# Patient Record
Sex: Male | Born: 1935 | Race: White | Hispanic: No | Marital: Married | Smoking: Former smoker
Health system: Southern US, Community
[De-identification: ages and names within clinical notes are randomized; demographics above are authoritative.]

## PROBLEM LIST (undated history)

## (undated) DIAGNOSIS — I2510 Atherosclerotic heart disease of native coronary artery without angina pectoris: Secondary | ICD-10-CM

## (undated) DIAGNOSIS — I10 Essential (primary) hypertension: Secondary | ICD-10-CM

## (undated) DIAGNOSIS — I4891 Unspecified atrial fibrillation: Secondary | ICD-10-CM

## (undated) DIAGNOSIS — C3412 Malignant neoplasm of upper lobe, left bronchus or lung: Secondary | ICD-10-CM

## (undated) HISTORY — DX: Malignant neoplasm of upper lobe, left bronchus or lung: C34.12

## (undated) HISTORY — PX: TONSILLECTOMY: SUR1361

## (undated) HISTORY — DX: Unspecified atrial fibrillation: I48.91

---

## 1998-11-29 ENCOUNTER — Inpatient Hospital Stay (HOSPITAL_COMMUNITY): Admission: EM | Admit: 1998-11-29 | Discharge: 1998-12-04 | Payer: Self-pay | Admitting: *Deleted

## 2004-10-28 ENCOUNTER — Ambulatory Visit (HOSPITAL_COMMUNITY): Admission: RE | Admit: 2004-10-28 | Discharge: 2004-10-28 | Payer: Self-pay | Admitting: Family Medicine

## 2004-11-11 ENCOUNTER — Ambulatory Visit (HOSPITAL_COMMUNITY): Admission: RE | Admit: 2004-11-11 | Discharge: 2004-11-11 | Payer: Self-pay | Admitting: Internal Medicine

## 2004-11-11 ENCOUNTER — Ambulatory Visit: Payer: Self-pay | Admitting: Internal Medicine

## 2005-09-16 HISTORY — PX: NM MYOCAR PERF WALL MOTION: HXRAD629

## 2005-10-22 ENCOUNTER — Ambulatory Visit (HOSPITAL_COMMUNITY): Admission: RE | Admit: 2005-10-22 | Discharge: 2005-10-22 | Payer: Self-pay | Admitting: *Deleted

## 2005-10-28 ENCOUNTER — Ambulatory Visit (HOSPITAL_COMMUNITY): Admission: RE | Admit: 2005-10-28 | Discharge: 2005-10-28 | Payer: Self-pay | Admitting: *Deleted

## 2005-10-28 HISTORY — PX: CARDIAC CATHETERIZATION: SHX172

## 2005-12-10 ENCOUNTER — Inpatient Hospital Stay (HOSPITAL_COMMUNITY): Admission: RE | Admit: 2005-12-10 | Discharge: 2005-12-18 | Payer: Self-pay | Admitting: Cardiothoracic Surgery

## 2005-12-10 HISTORY — PX: CORONARY ARTERY BYPASS GRAFT: SHX141

## 2006-01-16 ENCOUNTER — Encounter: Admission: RE | Admit: 2006-01-16 | Discharge: 2006-01-16 | Payer: Self-pay | Admitting: Cardiothoracic Surgery

## 2006-01-19 ENCOUNTER — Encounter (HOSPITAL_COMMUNITY): Admission: RE | Admit: 2006-01-19 | Discharge: 2006-02-18 | Payer: Self-pay | Admitting: *Deleted

## 2006-02-19 ENCOUNTER — Encounter (HOSPITAL_COMMUNITY): Admission: RE | Admit: 2006-02-19 | Discharge: 2006-03-21 | Payer: Self-pay | Admitting: *Deleted

## 2006-03-23 ENCOUNTER — Encounter (HOSPITAL_COMMUNITY): Admission: RE | Admit: 2006-03-23 | Discharge: 2006-04-22 | Payer: Self-pay | Admitting: *Deleted

## 2006-04-24 ENCOUNTER — Encounter (HOSPITAL_COMMUNITY): Admission: RE | Admit: 2006-04-24 | Discharge: 2006-05-24 | Payer: Self-pay | Admitting: *Deleted

## 2007-02-09 ENCOUNTER — Ambulatory Visit (HOSPITAL_COMMUNITY): Admission: RE | Admit: 2007-02-09 | Discharge: 2007-02-09 | Payer: Self-pay | Admitting: *Deleted

## 2007-06-28 ENCOUNTER — Ambulatory Visit (HOSPITAL_COMMUNITY): Admission: RE | Admit: 2007-06-28 | Discharge: 2007-06-28 | Payer: Self-pay

## 2009-02-02 ENCOUNTER — Ambulatory Visit (HOSPITAL_COMMUNITY): Admission: RE | Admit: 2009-02-02 | Discharge: 2009-02-02 | Payer: Self-pay | Admitting: Nephrology

## 2010-08-19 ENCOUNTER — Ambulatory Visit (HOSPITAL_COMMUNITY): Admission: RE | Admit: 2010-08-19 | Discharge: 2010-08-19 | Payer: Self-pay | Admitting: Internal Medicine

## 2011-03-07 NOTE — Op Note (Signed)
[name], [name]              ACCOUNT NO.:  [id number]   MEDICAL RECORD NO.:  [id number]          PATIENT TYPE:  INP   LOCATION:  2310                         FACILITY:  [facility]   PHYSICIAN:  Kerin Perna, M.D.  DATE OF BIRTH:  [birth year]   DATE OF PROCEDURE:  12/10/2005  DATE OF DISCHARGE:                                 OPERATIVE REPORT   OPERATION:  Coronary artery bypass grafting x4 (left internal mammary artery  to LAD, saphenous vein graft to diagonal, saphenous vein graft to ramus  intermediate, saphenous vein graft to distal right coronary artery).   PRE AND POSTOPERATIVE DIAGNOSIS:  Class IV progressive angina with severe  three-vessel coronary artery disease.   SURGEON:  Kerin Perna MD   ASSISTANT:  Gershon Crane PA-C   ANESTHESIA:  General by Dr. Judie Petit.   INDICATIONS:  The patient is a 76 year old white male smoker who presented  with history of exertional chest pain. He was evaluated by his cardiologist,  Dr. Domingo Sep and a stress test showed evidence of ischemia. A cardiac  catheterization by Dr. Jenne Campus demonstrated a high-grade stenosis of the  diagonal (90%) and moderate stenosis of the LAD (60%). His right coronary  had severe mid vessel stenosis of 80-90% and the ramus intermediate had a 60  to 80% stenosis. The circumflex was a small nondominant vessel and had  moderate disease. His LV EF was 55%. His left ventricular end-diastolic  pressure was elevated at 28 mmHg. He is felt to be candidate for surgical  revascularization.   Prior to surgery I examined the patient in the office and reviewed results  of the cardiac catheterization with the patient and family. I discussed  indications and expected benefits of coronary artery bypass surgery for  treatment of his coronary disease. I reviewed the alternatives to surgical  therapy as well. I discussed with the patient major aspects of the planned  procedure including the choice of conduit to  include internal mammary artery  and endoscopically harvested saphenous vein, the location of the surgical  incisions, use of general anesthesia and cardiopulmonary bypass, and the  expected postoperative recovery. I discussed with the patient risks to him  of coronary artery bypass surgery including risks of MI, CVA, bleeding,  blood transfusion requirement, infection, and death. After reviewing all  these issues with the patient, he demonstrated his understanding and agreed  to proceed with operation as planned under what I felt was an informed  consent.   OPERATIVE FINDINGS:  The patient's saphenous vein was harvested  endoscopically from the thigh and from the entire right lower leg below the  knee using an open technique. The mammary artery was a good vessel with  excellent flow. The circumflex vessels were small less than 1 mm and  nongraftable. The posterior descending vessel was less than 1 mm and not  graftable and the bypass graft to right coronary was placed beyond the tight  mid vessel obstruction. The patient did not receive aprotinin, he did not  receive blood transfusion products.   PROCEDURE:  The patient was brought to operative room,  placed supine on the  operative table. General anesthesia was induced under invasive hemodynamic  monitoring. The chest, abdomen and legs were prepped with Betadine and  draped as a sterile field. A sternal incision was made as the saphenous vein  was harvested endoscopically from the right leg. The left internal mammary  artery was harvested as a pedicle graft from its origin at the subclavian  vessels.  It was a good vessel with excellent flow. Heparin was administered  and the ACT was documented as being therapeutic. The sternal retractor was  placed and the pericardium was opened and suspended. Pursestrings were  placed in the ascending aorta and right atrium and the patient was  cannulated placed on bypass. The coronaries were  identified for grafting in  the mammary artery and vein grafts were prepared for the distal anastomoses.  The cardioplegia catheters were placed in the ascending aorta and into the  coronary sinus for both antegrade and retrograde cold blood cardioplegia.  The patient was cooled to 32 degrees. The crossclamp was then applied and  800 mL of the of cold blood cardioplegia was delivered in split doses  between the antegrade aortic and retrograde coronary sinus catheters. There  was good cardioplegic arrest and septal temperature dropped less than 12  degrees.   The distal coronary anastomoses were performed. First distal anastomosis was  to the distal right coronary. This a 1.75-mm vessel with proximal 80 to 90%  stenosis. Reverse saphenous vein sewn end-to-side with running 7-0 Prolene  and there was good flow through graft. The second distal anastomosis was to  the first diagonal branch of the LAD. This was a 1.5-mm vessel with proximal  90% stenosis. Reverse saphenous vein sewn end-to-side with running 7-0  Prolene with good flow through graft. Cardioplegia was redosed. The third  distal anastomosis was to the ramus intermediate. This is a 1.5-mm vessel  with proximal 75% stenosis. A reverse saphenous vein was sewn end-to-side  with running 7-0 Prolene.  There was good flow through the graft. The fourth  distal anastomosis was to the distal third of the LAD. More proximally it  was deeply intramyocardial. The left IMA pedicle was brought through an  opening created in the left lateral pericardium and was brought down onto  the LAD and sewn end-to-side with running 8-0 Prolene. There is excellent  flow through the anastomosis after briefly releasing the pedicle bulldog on  the mammary pedicle. The bulldog was replaced in the pedicle secured  epicardium.   Cardioplegia was redosed using the retrograde catheter. While the crossclamp  was still in place, three proximal vein anastomoses were  performed on the ascending aorta using a 4.0-mm punch and running 6-0 Prolene. Prior to  releasing crossclamp and prior to tying down the final proximal anastomosis,  air was vented from the coronaries and the left side of the heart using a  dose of retrograde warm blood cardioplegia and the usual de-airing maneuvers  on bypass. The final proximal anastomosis was then tied down and the  crossclamp was removed.   The heart was cardioverted back to a regular rhythm. Air was aspirated from  the vein grafts using a 27 gauge needle. The bypass grafts were opened and  each had excellent flow. Hemostasis was documented at the proximal and  distal sites. The patient was rewarmed to 37 degrees. Temporary pacing wires  were applied. Lungs re-expanded and the ventilator was resumed. The patient  was then weaned from bypass without difficulty without  inotropes. Blood  pressure and cardiac output were stable. Protamine was administered without  adverse reaction. The cannulas removed. The mediastinum was irrigated with  warm antibiotic solution. The leg incision was irrigated and closed in a  standard fashion. The superior pericardium was closed over the heart. Two  mediastinal and left pleural chest tube were placed and brought out through  separate incisions. The sternum was closed with interrupted steel wire. The  pectoralis fascia was closed with a running #1 Vicryl. Subcutaneous and skin  layers were closed with running Vicryl and sterile dressings were applied.  Total bypass time was 120 minutes, crossclamp time of 80 minutes.      Kerin Perna, M.D.  Electronically Signed     PV/MEDQ  D:  12/10/2005  T:  12/11/2005  Job:  [id number]   cc:   Dani Gobble, MD  Fax: [phone]

## 2011-03-07 NOTE — Op Note (Signed)
NAMEIFEANYI, MICKELSON              ACCOUNT NO.:  [id number]   MEDICAL RECORD NO.:  [id number]          PATIENT TYPE:  AMB   LOCATION:  DAY                           FACILITY:  [facility]   PHYSICIAN:  R. Roetta Sessions, M.D. DATE OF BIRTH:  [birth year]   DATE OF PROCEDURE:  11/11/2004  DATE OF DISCHARGE:                                 OPERATIVE REPORT   PROCEDURE:  Colonoscopy screening with biopsy.   ENDOSCOPIST:  Jonathon Bellows, M.D.   INDICATIONS FOR PROCEDURE:  The patient is a 76 year old gentleman sent to  me at the courtesy of Dr. Jorene Guest for screening colonoscopy.  He is devoid  of any lower GI tract symptoms. He has never had colon imaging previously.  Family history is significant in that he had a sister at age 85 succumb to  colorectal cancer.  This approach has been discussed with the patient at  length with the potential risks, benefits and alternatives having been  reviewed and questions answered.  Please see documentation in the medical  record.   DESCRIPTION OF PROCEDURE:  02 saturation, blood pressure, pulse and  respirations were monitored throughout the entirety of the procedure.  Conscious sedation using Versed 2 mg IV and Demerol 50 mg IV in single dose.  The instrument, Olympus video chip system.   FINDINGS:  Digital rectal examination revealed no abnormalities.  The  endoscopic prep was good.   Rectum:  Normal mucosa.  Retroflexion view of the anal verge revealed no  abnormalities in the anticolonic or colonic mucosa which was surveyed from  the rectosigmoid junction to the left, transverse and right colon to the  appendical orifice and ileocecal valve and cecum.  These structures were  well seen and photographed for the record.  Slowly, the scope was slow  withdrawn.  The previous imaged mucosal surfaces were again seen.  The  patient had a 3 mm polyp at the base of the cecum which was cold  biopsied/removed.  The remainder of the colonic mucosa appeared  normal.  The  patient tolerated the procedure well and was reacted.   ENDOSCOPIC ASSESSMENT:  1.  Normal rectum.  2.  Diminutive cecal polyp, cold biopsied/removed.  3.  The remainder of the colonic mucosa appeared normal.   RECOMMENDATIONS:  1.  Follow-up on path.  2.  Further recommendations to follow.      RMR/MEDQ  D:  11/11/2004  T:  11/11/2004  Job:  [id number]   cc:   Reynolds Bowl, M.D.  [place], [place]

## 2011-03-07 NOTE — Cardiovascular Report (Signed)
[name], [name]              ACCOUNT NO.:  [id number]   MEDICAL RECORD NO.:  [id number]          PATIENT TYPE:  OIB   LOCATION:  2899                         FACILITY:  [facility]   PHYSICIAN:  Darlin Priestly, MD  DATE OF BIRTH:  [birth year]   DATE OF PROCEDURE:  10/28/2005  DATE OF DISCHARGE:  10/28/2005                              CARDIAC CATHETERIZATION   PROCEDURE:  1.  Left heart catheterization.  2.  Coronary angiography.  3.  Left ventriculogram.  4.  Abdominal aortogram.  5.  Bilateral renal angiogram.   ATTENDING:  Darlin Priestly, M.D.   COMPLICATIONS:  None.   INDICATION:  [name] is a 76 year old male, patient of Dr. Kem Boroughs, Dr. Geanie Cooley, with a history of CAD, status post inferior wall  MI in 2000. 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 He has been followed by Dr. Domingo Sep since that time and recently  underwent repeat Cardiolite scan revealing anterolateral ischemia. He is now  brought for cardiac catheterization to reassess his coronary anatomy.   DESCRIPTION OF OPERATION:  After gaining informed consent the patient was  brought to the cardiac cath lab. The left groin was shaved, prepped and  draped in the usual sterile fashion. He received ___________. He is in  modified Seldinger. A 6 French diagnostic catheter was being performed and  diagnostic angiography.   Left main is a large vessel with no significant disease. It gives rise to  the LAD and a ramus intermedius. The left main has no significant disease.   The LAD is a large vessel which gives rise to one bifurcating diagonal  branch.  LAD has mild 50 to 60% stenosis in the mid LAD as well as 40%  stenosis just beyond the take off of the first diagonal.   The first diagonal is a medium size  vessel which bifurcates distally with a  95% ostial and proximal stenosis.   The ramus intermedius is a medium to large size vessel which bifurcates in  the mid segment with a 60% mid stenosis. There is a 50% lesion in the upper  branch of the ramus.   The AV circumflex is a medium size vessel which courses in the AV groove and  gives rise to 2 obtuse marginal branches as well as a large AV ___________  is a medium to small to medium size vessel which bifurcates distally and has  a 90% ostial lesion. The second limb is a medium size vessel with no  significant disease.   The right coronary artery is a large vessel which is dominant.  It gives  rise to both a PDA and posterolateral branch. It does original posteriorly  close to the left coronary cusp and is filled nonselectively with an AL2  catheter. There appears to be diffuse 40% proximal disease. There is a  relatively focal distal 80%  lesion which appears to be calcified. There is a  more distal 60% lesion just prior to the bifurcation of the PDA and  posterolateral branch.   The PDA and posterolateral branch __________ medium size vessels which are  irregular but have no __________ stenosis.   Left ventriculogram reveals a preserved EF of 50%.   Abdominal aortogram was no __________ although the renals are not well  visualized.   Bilateral selective renal angiograms reveal no evidence of significant  artery stenosis.   Hemodynamic system:  Systemic arterial pressure 187/84, LV systemic pressure  187/13, LVP of 20.   CONCLUSION:  1.  Significant 3 vessel coronary artery disease.  2.  Normal left ventricular systolic function.  3.  No evidence for renal artery stenosis.  4.  Systemic hypertension.  5.  Elevated left ventricular pressure.      Darlin Priestly, MD  Electronically Signed     RHM/MEDQ  D:  10/28/2005  T:  10/29/2005  Job:  [id number]

## 2011-03-07 NOTE — Discharge Summary (Signed)
[name], [name]              ACCOUNT NO.:  [id number]   MEDICAL RECORD NO.:  [id number]          PATIENT TYPE:  INP   LOCATION:  2006                         FACILITY:  [facility]   PHYSICIAN:  Kerin Perna, M.D.  DATE OF BIRTH:  07/14/1935   DATE OF ADMISSION:  12/10/2005  DATE OF DISCHARGE:                                 DISCHARGE SUMMARY   DATE OF DISCHARGE:  Anticipated, 12/17/2005.   PRIMARY ADMITTING DIAGNOSIS:  Severe three-vessel coronary artery disease.   DISCHARGE DIAGNOSES:  1.  Severe three-vessel coronary artery disease.  2.  Stable angina.  3.  Hypertension.  4.  Hyperlipidemia.  5.  Ongoing tobacco abuse.  6.  Postoperative atrial fibrillation/atrial flutter.  7.  Chronic obstructive pulmonary disease.   PROCEDURE PERFORMED:  1.  Coronary artery bypass grafting x4 (left internal mammary artery to the      LAD, saphenous vein graft to the diagonal, saphenous vein graft to the      ramus intermedius, saphenous vein graft to the distal right coronary      artery).  2.  Endoscopic vein harvest, right leg.   HISTORY:  The patient is 76 year old white male.  He was recently referred  to Dr. Donata Clay for evaluation of severe three-vessel coronary artery  disease.  He has a history of coronary disease and had a myocardial  infarction in 2000.  He was lost to followup, however, he recently was seen  by Dr. Domingo Sep and was evaluated with a stress Cardiolite study.  He has  had stable angina symptoms and was found on Cardiolite to have evidence of  ischemia.  Subsequently, a cardiac catheterization was performed, by Dr.  Jenne Campus, which showed severe three-vessel coronary artery disease with high-  grade stenosis of the diagonal at 90%, moderate stenosis of the LAD at 60%,  severe mid right stenosis of 80-90%, and a 60-80% stenosis of the ramus  intermedius.  His left ventricular ejection fraction was 55%. He was felt to  be a poor candidate for percutaneous  intervention.  Dr. Donata Clay saw the  patient and evaluated his films and felt that his best course of action  would be to proceed with surgical revascularization.  He explained the  risks, benefits, and alternatives of the procedure to the patient and he  agreed to proceed with surgery.   HOSPITAL COURSE:  [name] was admitted to [facility], on  December 10, 2005, and was taken to the operating room where he underwent  CABG x4, as described in detail above.  He tolerated the procedure well and  was transferred to the SICU in stable condition.  He was able to be  extubated shortly after surgery.  He was hemodynamically stable and doing  well on postop day #1.  At that time, his chest tubes and hemodynamic  monitoring devices were removed, and he was kept in the unit for overnight  observation.  By postop day #2, he was ready for transfer to the floor.  He  developed atrial fibrillation postoperatively and was started on an  amiodarone drip.  He converted to normal sinus rhythm and was switched to  p.o. amiodarone.  However, he continued to have intermittent breakthrough  episodes of atrial fibrillation and atrial flutter.  His beta blocker dose  was titrated upward, and he was ultimately started on Coumadin on December 15, 2005.  Since that time, his rhythm has stabilized and presently he is  maintaining rates of 80-90 in sinus rhythm.  He has otherwise done well  postoperatively.  He developed a productive cough with a slight elevation in his white blood  cell count.  He was started empirically on Avelox and was treated with  aggressive pulmonary toilet measures including nebulizer treatments and  metered-dose inhalers.  His white blood cell count has trended downward and  his lungs are now clear on exam.  He has also been volume overloaded and has  been diuresed back down to his preoperative weight.  He has ambulated in the  halls without difficulty and is tolerating a  regular diet without problem.  Presently, he is afebrile and all vital signs are stable.  His labs, on December 16, 2005, show a hemoglobin of 10.9, hematocrit 31.6,  white count 8.1, platelets 229.  Sodium 139, potassium 4.1, BUN 26,  creatinine 1.1.  PT 19.6, INR 1.6.  His incisions are all healing well.  It is felt that he remains stable over the next 24 hours and his heart  rhythm remains sinus, that he will hopefully be ready for discharge home on  December 17, 2005.  He will be evaluated on morning rounds and hopefully his  INR will be therapeutic at that time.   DISCHARGE MEDICATIONS:  Will be as follows:  1.  Coumadin home dose will be determined by PT and INR drawn on the date of      discharge.  2.  Aspirin 81 mg daily.  3.  Lopressor 25 mg b.i.d.  4.  Zocor 40 mg q.h.s.  5.  Amiodarone 400 mg b.i.d. x3 days, then 200 mg b.i.d.  6.  Lasix 40 mg every day x1 week.  7.  K-Dur 20 mEq every day x1 week.  8.  Flomax 0.4 mg every day.  9.  Advair 250/50, one puff b.i.d.  10. Ultram 50 to 100 mg every four to six hours p.r.n. for pain.   DISCHARGE INSTRUCTIONS:  1.  He is asked to refrain from driving, heavy lifting or strenuous      activity.  2.  He may continue to ambulate daily and use his incentive spirometer.  3.  He may shower daily and clean his incisions with soap and water.  4.  He will continue his same preoperative diet.   DISCHARGE FOLLOWUP:  1.  He is asked to make an appointment to see Dr. Domingo Sep in two weeks.  2.  He will also need to have a PT and INR drawn at the [facility] around 48 hours from the time of discharge for      monitoring of his Coumadin.  3.  He will see Dr. Donata Clay back in the office in three weeks and our      office will contact him with the appointment and an appointment for a      chest x-ray at [facility].  He is asked to contact our office in the interim if he experiences any  problems or has  questions.      Coral Ceo, P.A.  Kerin Perna, M.D.  Electronically Signed    GC/MEDQ  D:  12/16/2005  T:  12/16/2005  Job:  [id number]   cc:   Kem Boroughs, Dr.   Corrie Mckusick, M.D.  Fax: [phone]

## 2011-06-03 ENCOUNTER — Other Ambulatory Visit (HOSPITAL_COMMUNITY): Payer: Self-pay | Admitting: Family Medicine

## 2011-06-03 ENCOUNTER — Ambulatory Visit (HOSPITAL_COMMUNITY)
Admission: RE | Admit: 2011-06-03 | Discharge: 2011-06-03 | Disposition: A | Discharge status: Home / Self Care | Payer: Medicare Other | Source: Ambulatory Visit | Attending: Family Medicine | Admitting: Family Medicine

## 2011-06-03 DIAGNOSIS — M545 Low back pain, unspecified: Secondary | ICD-10-CM | POA: Insufficient documentation

## 2011-06-03 DIAGNOSIS — M549 Dorsalgia, unspecified: Secondary | ICD-10-CM

## 2011-06-03 DIAGNOSIS — M899 Disorder of bone, unspecified: Secondary | ICD-10-CM | POA: Insufficient documentation

## 2011-06-03 DIAGNOSIS — M949 Disorder of cartilage, unspecified: Secondary | ICD-10-CM | POA: Insufficient documentation

## 2011-06-03 DIAGNOSIS — M5137 Other intervertebral disc degeneration, lumbosacral region: Secondary | ICD-10-CM | POA: Insufficient documentation

## 2011-06-03 DIAGNOSIS — M51379 Other intervertebral disc degeneration, lumbosacral region without mention of lumbar back pain or lower extremity pain: Secondary | ICD-10-CM | POA: Insufficient documentation

## 2012-03-02 ENCOUNTER — Encounter (HOSPITAL_COMMUNITY): Payer: Self-pay | Admitting: *Deleted

## 2012-03-02 ENCOUNTER — Emergency Department (HOSPITAL_COMMUNITY)
Admission: EM | Admit: 2012-03-02 | Discharge: 2012-03-02 | Disposition: A | Discharge status: Home / Self Care | Payer: Medicare Other | Attending: Emergency Medicine | Admitting: Emergency Medicine

## 2012-03-02 ENCOUNTER — Emergency Department (HOSPITAL_COMMUNITY): Payer: Medicare Other

## 2012-03-02 DIAGNOSIS — M25552 Pain in left hip: Secondary | ICD-10-CM

## 2012-03-02 DIAGNOSIS — I10 Essential (primary) hypertension: Secondary | ICD-10-CM | POA: Insufficient documentation

## 2012-03-02 DIAGNOSIS — M545 Low back pain, unspecified: Secondary | ICD-10-CM

## 2012-03-02 DIAGNOSIS — X500XXA Overexertion from strenuous movement or load, initial encounter: Secondary | ICD-10-CM | POA: Insufficient documentation

## 2012-03-02 DIAGNOSIS — T148XXA Other injury of unspecified body region, initial encounter: Secondary | ICD-10-CM

## 2012-03-02 DIAGNOSIS — C000 Malignant neoplasm of external upper lip: Secondary | ICD-10-CM | POA: Insufficient documentation

## 2012-03-02 DIAGNOSIS — M25559 Pain in unspecified hip: Secondary | ICD-10-CM | POA: Insufficient documentation

## 2012-03-02 HISTORY — DX: Atherosclerotic heart disease of native coronary artery without angina pectoris: I25.10

## 2012-03-02 HISTORY — DX: Essential (primary) hypertension: I10

## 2012-03-02 MED ORDER — METHOCARBAMOL 750 MG PO TABS: 750.0000 mg | ORAL_TABLET | Freq: Three times a day (TID) | ORAL | Status: AC | PRN

## 2012-03-02 NOTE — ED Notes (Signed)
MD at bedside. 

## 2012-03-02 NOTE — ED Notes (Signed)
Pt c/o back and hip pain since Friday, pt states "i twisted wrong,"

## 2012-03-02 NOTE — Discharge Instructions (Signed)
RESOURCE GUIDE  Dental Problems  Patients with Medicaid: [facility]                     [facility] [address]                                           [address] Phone:  [phone]                                                  Phone:  [phone]  If unable to pay or uninsured, contact:  [facility] or [facility] to become qualified for the adult dental clinic.  Chronic Pain Problems Contact [facility]  297-2271 Patients need to be referred by their primary care doctor.  Insufficient Money for Medicine Contact [employer]:  call "211" or [facility] 271-5999.  No Primary Care Doctor Call [facility]  832-8000 Other agencies that provide inexpensive medical care    [facility]  832-8035    [facility]  832-7272    [facility]  [phone]    Women[facility] Clinic  832-4777    [facility]  [phone]    [facility]  272-1050  Psychological Services [facility]  832-9600 [facility]  [phone] [facility]   800 853-5163 (emergency services 641-4993)  Substance Abuse Resources [facility]  336-882-2125 [facility] 336-784-9470 [facility] 336-285-9073 [facility] 336-845-3988 Residential & Outpatient Substance Abuse Program  800-659-3381  Abuse/Neglect [place] Child Abuse Hotline (336) 641-3795 [place] Child Abuse Hotline 800-378-5315 (After Hours)  Emergency Shelter [facility] (336) 271-5985  [facility] (336) 275-9566 [facility] (704) 372-4663  MRSA Hotline #:   832-7006    [place] [facility]  [facility] [place]     [employer]                          [facility] [address] [place]                       [address]      [address]  [place]                                                [place]                            [place] Phone:  [phone]                                   Phone:  342-7768                 Phone:  342-8140  [facility] Phone:  342-8316    [place] Child Abuse Hotline (336) 342-1394 (336) 342-3537 (After Hours)    Take the prescription as directed.  Apply moist heat or ice to the area(s) of discomfort, for 15 minutes at a time, several times per day for the next few days.  Do not fall asleep on a heating or ice pack.  Call your regular medical doctor today to schedule a follow up appointment this week.  Return to the Emergency Department immediately if worsening.  

## 2012-03-02 NOTE — ED Provider Notes (Signed)
History   This chart was scribed for Kenneth Anger, DO by [name]. The patient was seen in room APA19/APA19.  CSN: [id number]  Arrival date & time 03/02/12  1041   First MD Initiated Contact with Patient 03/02/12 1106      Chief Complaint  Patient presents with  . Back Pain     HPI  Pt see at 1135. [name] is a 77 y.o. male who presents to the Emergency Department complaining of gradual onset and persistence of constant left lower back and hip "pain" onset 4 days ago. Pt says he "twisted himself" before symptoms began.  Pain increases with walking/weight bearing on his left leg and certain body positions.  Pain improves with massage to his left gluteal muscles area, ice packs and ibuprofen.  Pt states the pain was improving until this morning when he "got caught up in the dog leash" while walking his dog and "twisted around again."  Denies direct injury to hip or back, no falls, no migration or radiation of pain, no saddle anesthesia, no incont/retention of bowel or bladder, no focal motor weakness, no tingling/numbess in extremities, no fevers, no CP/SOB, no abd pain, no N/V/D.      Past Medical History  Diagnosis Date  . Hypertension   . Coronary artery disease     Past Surgical History  Procedure Date  . Cardiac surgery   . Tonsillectomy       History  Substance Use Topics  . Smoking status: Never Smoker   . Smokeless tobacco: Not on file  . Alcohol Use: No      Review of Systems ROS: Statement: All systems negative except as marked or noted in the HPI; Constitutional: Negative for fever and chills. ; ; Eyes: Negative for eye pain, redness and discharge. ; ; ENMT: Negative for ear pain, hoarseness, nasal congestion, sinus pressure and sore throat. ; ; Cardiovascular: Negative for chest pain, palpitations, diaphoresis, dyspnea and peripheral edema. ; ; Respiratory: Negative for cough, wheezing and stridor. ; ; Gastrointestinal: Negative for nausea,  vomiting, diarrhea, abdominal pain, blood in stool, hematemesis, jaundice and rectal bleeding. . ; ; Genitourinary: Negative for dysuria, flank pain and hematuria. ; Genital:  No penile drainage or rash, no testicular pain or swelling, no scrotal rash or swelling.  ; Musculoskeletal: +back pain, hip pain. Negative for neck pain. Negative for swelling and trauma.; ; Skin: Negative for pruritus, rash, abrasions, blisters, bruising and skin lesion.; ; Neuro: Negative for headache, lightheadedness and neck stiffness. Negative for weakness, altered level of consciousness , altered mental status, extremity weakness, paresthesias, involuntary movement, seizure and syncope.     Allergies  Sulfa antibiotics  Home Medications  No current outpatient prescriptions on file.  BP 173/74  Pulse 66  Temp(Src) 97.7 F (36.5 C) (Oral)  Resp 20  Ht 5\' 7"  (1.702 m)  Wt 204 lb (92.534 kg)  BMI 31.95 kg/m2  SpO2 97%  Physical Exam 1140: Physical examination:  Nursing notes reviewed; Vital signs and O2 SAT reviewed;  Constitutional: Well developed, Well nourished, Well hydrated, In no acute distress; Head:  Normocephalic, atraumatic; Eyes: EOMI, PERRL, No scleral icterus; ENMT: Mouth and pharynx normal, Mucous membranes moist; Neck: Supple, Full range of motion, No lymphadenopathy; Cardiovascular: Regular rate and rhythm, No gallop; Respiratory: Breath sounds clear & equal bilaterally, No wheezes, Normal respiratory effort/excursion; Chest: Nontender, Movement normal; Genitourinary: No CVA tenderness; Spine:  No midline CS, TS, LS tenderness. +TTP left lower lumbar paraspinal,  SI joint and gluteal muscles; Extremities: Pulses normal, No tenderness, No edema, No calf edema or asymmetry.; Neuro: AA&Ox3, Major CN grossly intact. Speech clear, no facial droop. Strength 5/5 equal bilat UE's and LE's, including great toe dorsiflexion.  DTR 2/4 equal bilat UE's and LE's.  No gross sensory deficits.  Neg straight leg raises  bilat..; Skin: Color normal, Warm, Dry, no rash.    ED Course  Procedures     MDM  MDM Reviewed: nursing note, vitals and previous chart Reviewed previous: x-ray Interpretation: x-ray   Dg Lumbar Spine Complete 03/02/2012  *RADIOLOGY REPORT*  Clinical Data: Low back pain.  No known injury.  LUMBAR SPINE - COMPLETE 4+ VIEW  Comparison: [facility] films 06/03/2011  Findings: Disc space narrowing L5-S1 and L1-L2.  Anatomic alignment.  No compression fracture or osseous destructive lesion. Lower lumbar facet arthropathy.  Vascular calcification.  Little change from priors.  IMPRESSION: No acute lumbar spine abnormality.  Original Report Authenticated By: Elsie Stain, M.D.   Dg Hip Complete Left 03/02/2012  *RADIOLOGY REPORT*  Clinical Data: Pain, no known injury.  LEFT HIP - COMPLETE 2+ VIEW  Comparison:  None.  Findings:  There is no evidence of hip fracture or dislocation. There is no evidence of arthropathy or other focal bone abnormality.  IMPRESSION: Negative.  Original Report Authenticated By: Elsie Stain, M.D.   Karie Georges LS 05/2011: IMPRESSION:  Degenerative disc and facet disease changes of lumbar spine with osseous demineralization.  No acute abnormalities.  Original Report Authenticated By: Lollie Marrow, M.D.    11:52 AM:  EPIC chart review reveals pt had XR LS in 05/2011 for LBP as above.  Denies falling or direct injury to low back/hip.  No abd pain.  No neuro deficits.  No concerning signs for cauda equina at this time.  Will tx symptomatically.  Dx testing d/w pt and family.  Questions answered.  Verb understanding, agreeable to d/c home with outpt f/u.        I personally performed the services described in this documentation, which was scribed in my presence. The recorded information has been reviewed and considered. [name], DO 03/05/12 2336

## 2012-03-02 NOTE — ED Notes (Addendum)
Pt returned from xray

## 2012-03-02 NOTE — ED Notes (Signed)
Twisted back and lt hip on Friday, has hurt since then.  Hurts to put wt on lt leg.

## 2012-05-05 ENCOUNTER — Emergency Department (HOSPITAL_COMMUNITY): Payer: Medicare Other

## 2012-05-05 ENCOUNTER — Encounter (HOSPITAL_COMMUNITY): Payer: Self-pay | Admitting: Emergency Medicine

## 2012-05-05 ENCOUNTER — Inpatient Hospital Stay (HOSPITAL_COMMUNITY)
Admission: EM | Admit: 2012-05-05 | Discharge: 2012-05-11 | DRG: 391 | Disposition: A | Discharge status: Home / Self Care | Payer: Medicare Other | Attending: Family Medicine | Admitting: Family Medicine

## 2012-05-05 DIAGNOSIS — D696 Thrombocytopenia, unspecified: Secondary | ICD-10-CM | POA: Diagnosis present

## 2012-05-05 DIAGNOSIS — N179 Acute kidney failure, unspecified: Secondary | ICD-10-CM | POA: Diagnosis present

## 2012-05-05 DIAGNOSIS — K529 Noninfective gastroenteritis and colitis, unspecified: Secondary | ICD-10-CM

## 2012-05-05 DIAGNOSIS — E860 Dehydration: Secondary | ICD-10-CM | POA: Diagnosis present

## 2012-05-05 DIAGNOSIS — N19 Unspecified kidney failure: Secondary | ICD-10-CM

## 2012-05-05 DIAGNOSIS — Z951 Presence of aortocoronary bypass graft: Secondary | ICD-10-CM

## 2012-05-05 DIAGNOSIS — E785 Hyperlipidemia, unspecified: Secondary | ICD-10-CM | POA: Diagnosis present

## 2012-05-05 DIAGNOSIS — I2510 Atherosclerotic heart disease of native coronary artery without angina pectoris: Secondary | ICD-10-CM | POA: Diagnosis present

## 2012-05-05 DIAGNOSIS — N170 Acute kidney failure with tubular necrosis: Secondary | ICD-10-CM | POA: Diagnosis present

## 2012-05-05 DIAGNOSIS — I951 Orthostatic hypotension: Secondary | ICD-10-CM

## 2012-05-05 DIAGNOSIS — R42 Dizziness and giddiness: Secondary | ICD-10-CM | POA: Diagnosis present

## 2012-05-05 DIAGNOSIS — Z7982 Long term (current) use of aspirin: Secondary | ICD-10-CM

## 2012-05-05 DIAGNOSIS — N400 Enlarged prostate without lower urinary tract symptoms: Secondary | ICD-10-CM | POA: Diagnosis present

## 2012-05-05 DIAGNOSIS — Z79899 Other long term (current) drug therapy: Secondary | ICD-10-CM

## 2012-05-05 DIAGNOSIS — R6510 Systemic inflammatory response syndrome (SIRS) of non-infectious origin without acute organ dysfunction: Secondary | ICD-10-CM | POA: Diagnosis present

## 2012-05-05 DIAGNOSIS — W57XXXA Bitten or stung by nonvenomous insect and other nonvenomous arthropods, initial encounter: Secondary | ICD-10-CM

## 2012-05-05 DIAGNOSIS — R519 Headache, unspecified: Secondary | ICD-10-CM | POA: Diagnosis present

## 2012-05-05 DIAGNOSIS — A938 Other specified arthropod-borne viral fevers: Secondary | ICD-10-CM | POA: Diagnosis present

## 2012-05-05 DIAGNOSIS — R1031 Right lower quadrant pain: Secondary | ICD-10-CM | POA: Diagnosis present

## 2012-05-05 DIAGNOSIS — K5289 Other specified noninfective gastroenteritis and colitis: Principal | ICD-10-CM | POA: Diagnosis present

## 2012-05-05 DIAGNOSIS — A419 Sepsis, unspecified organism: Secondary | ICD-10-CM

## 2012-05-05 DIAGNOSIS — R51 Headache: Secondary | ICD-10-CM

## 2012-05-05 DIAGNOSIS — I4891 Unspecified atrial fibrillation: Secondary | ICD-10-CM | POA: Diagnosis not present

## 2012-05-05 DIAGNOSIS — I10 Essential (primary) hypertension: Secondary | ICD-10-CM | POA: Diagnosis present

## 2012-05-05 DIAGNOSIS — R509 Fever, unspecified: Secondary | ICD-10-CM | POA: Diagnosis present

## 2012-05-05 DIAGNOSIS — R197 Diarrhea, unspecified: Secondary | ICD-10-CM | POA: Diagnosis present

## 2012-05-05 LAB — URINALYSIS, ROUTINE W REFLEX MICROSCOPIC
Bilirubin Urine: NEGATIVE
Ketones, ur: NEGATIVE mg/dL
Nitrite: NEGATIVE
Protein, ur: 30 mg/dL — AB
pH: 5.5 (ref 5.0–8.0)

## 2012-05-05 LAB — COMPREHENSIVE METABOLIC PANEL
ALT: 16 U/L (ref 0–53)
Albumin: 3.8 g/dL (ref 3.5–5.2)
Alkaline Phosphatase: 65 U/L (ref 39–117)
BUN: 43 mg/dL — ABNORMAL HIGH (ref 6–23)
Calcium: 9.5 mg/dL (ref 8.4–10.5)
GFR calc Af Amer: 27 mL/min — ABNORMAL LOW (ref >90)
Glucose, Bld: 152 mg/dL — ABNORMAL HIGH (ref 70–99)
Potassium: 3.4 mEq/L — ABNORMAL LOW (ref 3.5–5.1)
Sodium: 132 mEq/L — ABNORMAL LOW (ref 135–145)
Total Protein: 7.8 g/dL (ref 6.0–8.3)

## 2012-05-05 LAB — URINE MICROSCOPIC-ADD ON

## 2012-05-05 LAB — LACTIC ACID, PLASMA: Lactic Acid, Venous: 1.7 mmol/L (ref 0.5–2.2)

## 2012-05-05 LAB — CARDIAC PANEL(CRET KIN+CKTOT+MB+TROPI)
Relative Index: 0.9 (ref 0.0–2.5)
Troponin I: <0.3 ng/mL (ref <0.30)

## 2012-05-05 LAB — CBC WITH DIFFERENTIAL/PLATELET
Basophils Absolute: 0 10*3/uL (ref 0.0–0.1)
HCT: 51.5 % (ref 39.0–52.0)
Hemoglobin: 17.8 g/dL — ABNORMAL HIGH (ref 13.0–17.0)
Lymphs Abs: 1 10*3/uL (ref 0.7–4.0)
MCH: 29.5 pg (ref 26.0–34.0)
MCHC: 34.6 g/dL (ref 30.0–36.0)
MCV: 85.3 fL (ref 78.0–100.0)
Neutro Abs: 14.2 10*3/uL — ABNORMAL HIGH (ref 1.7–7.7)
RBC: 6.04 MIL/uL — ABNORMAL HIGH (ref 4.22–5.81)
RDW: 14.6 % (ref 11.5–15.5)
WBC: 16 10*3/uL — ABNORMAL HIGH (ref 4.0–10.5)

## 2012-05-05 MED ORDER — SODIUM CHLORIDE 0.9 % IV BOLUS (SEPSIS)
1000.0000 mL | Freq: Once | INTRAVENOUS | Status: AC
  Administered 2012-05-05: 1000 mL via INTRAVENOUS

## 2012-05-05 MED ORDER — ONDANSETRON HCL 4 MG/2ML IJ SOLN: 4.0000 mg | Freq: Three times a day (TID) | INTRAMUSCULAR | Status: DC | PRN

## 2012-05-05 MED ORDER — CIPROFLOXACIN IN D5W 400 MG/200ML IV SOLN
400.0000 mg | Freq: Once | INTRAVENOUS | Status: AC
  Administered 2012-05-05: 400 mg via INTRAVENOUS
  Filled 2012-05-05: qty 200

## 2012-05-05 MED ORDER — METRONIDAZOLE IN NACL 5-0.79 MG/ML-% IV SOLN
500.0000 mg | Freq: Once | INTRAVENOUS | Status: AC
  Administered 2012-05-05: 500 mg via INTRAVENOUS
  Filled 2012-05-05: qty 100

## 2012-05-05 MED ORDER — LOPERAMIDE HCL 2 MG PO CAPS
4.0000 mg | ORAL_CAPSULE | ORAL | Status: DC | PRN
  Administered 2012-05-05: 4 mg via ORAL
  Filled 2012-05-05 (×2): qty 2

## 2012-05-05 MED ORDER — ONDANSETRON HCL 4 MG/2ML IJ SOLN
4.0000 mg | Freq: Once | INTRAMUSCULAR | Status: AC
  Administered 2012-05-05: 4 mg via INTRAVENOUS
  Filled 2012-05-05: qty 2

## 2012-05-05 MED ORDER — SODIUM CHLORIDE 0.9 % IV SOLN: INTRAVENOUS | Status: DC

## 2012-05-05 MED ORDER — KETOROLAC TROMETHAMINE 30 MG/ML IJ SOLN
30.0000 mg | Freq: Once | INTRAMUSCULAR | Status: AC
Start: 2012-05-05 — End: 2012-05-05
  Administered 2012-05-05: 30 mg via INTRAVENOUS
  Filled 2012-05-05: qty 1

## 2012-05-05 MED ORDER — SODIUM CHLORIDE 0.9 % IV SOLN
Freq: Once | INTRAVENOUS | Status: AC
  Administered 2012-05-05: 100 mL/h via INTRAVENOUS

## 2012-05-05 NOTE — H&P (Signed)
PCP:   Kirk Ruths, MD   Chief Complaint:  Marcy Salvo  HPI: 76 year old male with a history of coronary artery disease status post CABG who is in otherwise healthy condition who over the last 3 days who has had significant amount of watery diarrhea and no appetite with barely any eating or drinking. He has been having some vague lower abdominal pain. He denies any nausea or vomiting. He says over the last several days he's may hardly any urine. It is taken his wife several days to finally convince him to come to the hospital. He's been experiencing a significant amount of dizziness and progressive worsening weakness when trying to walk over the last 24-48 hours. He's been running fevers, having a significant headache. 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 He denies any rashes. He does not have any kidney issues that he knows of. He denies any chest pain or shortness of breath.  Review of Systems:  Otherwise negative  Past Medical History: Past Medical History  Diagnosis Date  . Hypertension   . Coronary artery disease    Past Surgical History  Procedure Date  . Cardiac surgery   . Tonsillectomy     Medications: Prior to Admission medications   Medication Sig Start Date End Date Taking? Authorizing Provider  aspirin EC 81 MG tablet Take 81 mg by mouth daily.   Yes Historical Provider, MD  bismuth subsalicylate (PEPTO BISMOL) 262 MG chewable tablet Chew 524 mg by mouth as needed. For indigestion   Yes Historical Provider, MD  ibuprofen (ADVIL,MOTRIN) 200 MG tablet Take 400 mg by mouth every 6 (six) hours as needed. For headache   Yes Historical Provider, MD  LISINOPRIL PO Take 0.5 tablets by mouth daily.   Yes Historical Provider, MD  Tamsulosin HCl (FLOMAX) 0.4 MG CAPS Take 0.4 mg by mouth daily after supper.   Yes Historical Provider, MD    Allergies:   Allergies  Allergen Reactions  . Sulfa Antibiotics Swelling    Social  History:  reports that he has never smoked. He does not have any smokeless tobacco history on file. He reports that he does not drink alcohol or use illicit drugs.  Family History: History reviewed. No pertinent family history.  Physical Exam: Filed Vitals:   05/05/12 1722 05/05/12 1835 05/05/12 1929 05/05/12 1930  BP: 94/59 89/53 94/69  100/72  Pulse: 105 99 101 98  Temp: 97.4 F (36.3 C)  97.9 F (36.6 C)   TempSrc: Oral  Oral   Resp: 18 20 18    Height:      Weight:      SpO2: 98% 96% 95% 95%   General appearance: alert, cooperative and no distress  dehydrated Lungs: clear to auscultation bilaterally Heart: regular rate and rhythm, S1, S2 normal, no murmur, click, rub or gallop Abdomen: soft ttp rlq no r/g pos bs nondistended  Extremities: extremities normal, atraumatic, no cyanosis or edema Pulses: 2+ and symmetric Skin: Skin color, texture, turgor normal. No rashes or lesions Neurologic: Grossly normal    Labs on Admission:   Basename 05/05/12 1316  NA 132*  K 3.4*  CL 95*  CO2 21  GLUCOSE 152*  BUN 43*  CREATININE 2.55*  CALCIUM 9.5  MG --  PHOS --    Basename 05/05/12 1316  AST 25  ALT 16  ALKPHOS 65  BILITOT 0.7  PROT 7.8  ALBUMIN 3.8    Basename 05/05/12 1316  WBC  16.0*  NEUTROABS 14.2*  HGB 17.8*  HCT 51.5  MCV 85.3  PLT 144*    Basename 05/05/12 1316  CKTOTAL 200  CKMB 1.7  CKMBINDEX --  TROPONINI <0.30   Radiological Exams on Admission: Ct Abdomen Pelvis Wo Contrast  05/05/2012  *RADIOLOGY REPORT*  Clinical Data: Nausea, vomiting, diarrhea, history hypertension, coronary artery disease post MI and CABG  CT ABDOMEN AND PELVIS WITHOUT CONTRAST  Technique:  Multidetector CT imaging of the abdomen and pelvis was performed following the standard protocol without intravenous contrast. The patient drank dilute oral contrast for the exam. Sagittal and coronal MPR images reconstructed from axial data set.  Comparison: None  Findings: Prior  median sternotomy. Lung bases clear. Probable small liver cysts, largest 2.5 x 1.6 cm image 16. Cyst at inferior pole right kidney 2.0 x 2.0 cm image 37. Liver, spleen, kidneys, and at right adrenal gland otherwise normal appearance for noncontrast study. Small amount of free fluid adjacent to inferior right lobe of liver. Small left adrenal myelolipoma 1.4 x 1.1 cm containing foci of fat. Small lipoma within proximal tail of pancreas, 1.9 x 1.1 cm image 24.  Scattered atherosclerotic calcifications aorta and branch vessels. Bowel wall thickening identified at ascending colon, cecum and terminal ileum with mild edema and nonspecific normal-sized lymph nodes within adjacent mesentery. This results in a somewhat prominent appearance of the ileocecal valve which is of fat attenuation. Findings are most consistent with an enterocolitis. No evidence of perforation or abscess. Remaining large and small bowel loops unremarkable without obstruction.  Incompletely distended stomach. Tiny umbilical hernia containing fat. Prostatic enlargement, gland 5.38 x 5.1 x 5.2 cm. Bladder unremarkable. No mass or adenopathy. Dilated proximal left inguinal canal containing fat consistent with inguinal hernia. Scattered degenerative disc disease changes thoracolumbar spine.  IMPRESSION: Bowel wall thickening of the ascending colon, cecum and terminal ileum compatible with enterocolitis, question inflammatory bowel disease/Crohn's disease versus infection or less likely ischemia. Hepatic and right renal cysts. 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 Significant prostatic enlargement. Tiny umbilical and left inguinal hernias.  Findings called to Dr. Manus Gunning on 05/05/2012 at 1658 hours.  Original Report Authenticated By: Lollie Marrow, M.D.    Assessment/Plan Present on Admission:  77 year old male who presents with significant diarrhea, right lower quadrant abdominal pain, acute colitis on CT scan, fever, headache, and  recent tick bites.  .Acute colitis .Diarrhea .Acute renal failure .Dehydration .Fever .Headache .Tick bite .Abdominal pain, right lower quadrant .Dizziness - light-headed  Am going to cover him with ciprofloxacin and Flagyl for his colitis. I'm also going to send off a [facility] spotted fever titers and place him on doxycycline. Provide her with some IV fluids. He already feels significantly better with 3 L of IV fluids in the emergency department and his blood pressure has improved. His renal failure is likely due to significant dehydration. His lactic acid level is normal. [facility] will need [facility] as we have no step down bed here left at [facility].   Clodagh Odenthal A [phone] 05/05/2012, 9:02 PM

## 2012-05-05 NOTE — ED Provider Notes (Signed)
History    This chart was scribed for Kenneth Octave, MD, MD by Smitty Pluck. The patient was seen in room APA05 and the patient's care was started at 1:24PM.   CSN: [id number]  Arrival date & time 05/05/12  1249   First MD Initiated Contact with Patient 05/05/12 1321      Chief Complaint  Patient presents with  . Nausea  . Diarrhea  . Emesis    (Consider location/radiation/quality/duration/timing/severity/associated sxs/prior treatment) Patient is a 77 y.o. male presenting with diarrhea and vomiting. The history is provided by the patient.  Diarrhea The primary symptoms include vomiting and diarrhea.  Emesis  Associated symptoms include diarrhea.   [name] is a 77 y.o. male who presents to the Emergency Department complaining of moderate diarrhea onset 2 days ago. Sudden onset. Pt reports having diarrhea every hour 1 day ago. Stools have been watery. Denies blood in stool. Reports fever of 103. Reports mild abdominal pain. Reports having dizziness when he stands up. He reports that he has not had food in 2 days. denies chest pain, SOB. He reports having MI and open heart surgery 18 years ago. Reports having loss of sensation in right lower leg onset 9 years ago. Denies sick contact, recent travel. Symptoms have been constant. Denies radiation.  PCP is at [facility]    Past Medical History  Diagnosis Date  . Hypertension   . Coronary artery disease     Past Surgical History  Procedure Date  . Cardiac surgery   . Tonsillectomy     History reviewed. No pertinent family history.  History  Substance Use Topics  . Smoking status: Never Smoker   . Smokeless tobacco: Not on file  . Alcohol Use: No      Review of Systems  Gastrointestinal: Positive for vomiting and diarrhea.  All other systems reviewed and are negative.  10 Systems reviewed and all are negative for acute change except as noted in the HPI.    Allergies  Sulfa antibiotics  Home  Medications   Current Outpatient Rx  Name Route Sig Dispense Refill  . ASPIRIN EC 81 MG PO TBEC Oral Take 81 mg by mouth daily.    Marland Kitchen BISMUTH SUBSALICYLATE 262 MG PO CHEW Oral Chew 524 mg by mouth as needed. For indigestion    . IBUPROFEN 200 MG PO TABS Oral Take 400 mg by mouth every 6 (six) hours as needed. For headache    . LISINOPRIL PO Oral Take 0.5 tablets by mouth daily.    Marland Kitchen TAMSULOSIN HCL 0.4 MG PO CAPS Oral Take 0.4 mg by mouth daily after supper.      BP 94/59  Pulse 105  Temp 97.4 F (36.3 C) (Oral)  Resp 18  Ht 5\' 6"  (1.676 m)  Wt 200 lb (90.719 kg)  BMI 32.28 kg/m2  SpO2 98%  Physical Exam  Nursing note and vitals reviewed. Constitutional: He is oriented to person, place, and time. He appears well-developed and well-nourished. No distress.  HENT:  Head: Normocephalic and atraumatic.       Dry mucous membranes   Neck:       No meningismus   Cardiovascular: Normal rate, regular rhythm and normal heart sounds.  Exam reveals no gallop and no friction rub.   No murmur heard. Pulmonary/Chest: Effort normal and breath sounds normal.  Abdominal: Soft. There is tenderness (mild diffusely ). There is no rebound and no guarding.  Musculoskeletal:       Right lower  extremities appears asymmetric to left no calf tenderness    Neurological: He is oriented to person, place, and time.  Skin: Skin is warm and dry.  Psychiatric: He has a normal mood and affect. His behavior is normal.    ED Course  Procedures (including critical care time) DIAGNOSTIC STUDIES: Oxygen Saturation is 97% on room air, normal by my interpretation.    COORDINATION OF CARE: 1:27PM EDP discusses pt ED treatment with pt  1:41PM EDP orders medication: 0.9% NaCl infusion, zofran 4 mg    Labs Reviewed  CBC WITH DIFFERENTIAL - Abnormal; Notable for the following:    WBC 16.0 (*)     RBC 6.04 (*)     Hemoglobin 17.8 (*)     Platelets 144 (*)     Neutrophils Relative 89 (*)     Neutro Abs 14.2  (*)     Lymphocytes Relative 6 (*)     All other components within normal limits  COMPREHENSIVE METABOLIC PANEL - Abnormal; Notable for the following:    Sodium 132 (*)     Potassium 3.4 (*)     Chloride 95 (*)     Glucose, Bld 152 (*)     BUN 43 (*)     Creatinine, Ser 2.55 (*)     GFR calc non Af Amer 23 (*)     GFR calc Af Amer 27 (*)     All other components within normal limits  LACTIC ACID, PLASMA  CARDIAC PANEL(CRET KIN+CKTOT+MB+TROPI)  URINALYSIS, ROUTINE W REFLEX MICROSCOPIC  CLOSTRIDIUM DIFFICILE BY PCR  STOOL CULTURE   Ct Abdomen Pelvis Wo Contrast  05/05/2012  *RADIOLOGY REPORT*  Clinical Data: Nausea, vomiting, diarrhea, history hypertension, coronary artery disease post MI and CABG  CT ABDOMEN AND PELVIS WITHOUT CONTRAST  Technique:  Multidetector CT imaging of the abdomen and pelvis was performed following the standard protocol without intravenous contrast. The patient drank dilute oral contrast for the exam. Sagittal and coronal MPR images reconstructed from axial data set.  Comparison: None  Findings: Prior median sternotomy. Lung bases clear. Probable small liver cysts, largest 2.5 x 1.6 cm image 16. Cyst at inferior pole right kidney 2.0 x 2.0 cm image 37. Liver, spleen, kidneys, and at right adrenal gland otherwise normal appearance for noncontrast study. Small amount of free fluid adjacent to inferior right lobe of liver. Small left adrenal myelolipoma 1.4 x 1.1 cm containing foci of fat. Small lipoma within proximal tail of pancreas, 1.9 x 1.1 cm image 24.  Scattered atherosclerotic calcifications aorta and branch vessels. Bowel wall thickening identified at ascending colon, cecum and terminal ileum with mild edema and nonspecific normal-sized lymph nodes within adjacent mesentery. This results in a somewhat prominent appearance of the ileocecal valve which is of fat attenuation. Findings are most consistent with an enterocolitis. No evidence of perforation or abscess.  Remaining large and small bowel loops unremarkable without obstruction.  Incompletely distended stomach. Tiny umbilical hernia containing fat. Prostatic enlargement, gland 5.38 x 5.1 x 5.2 cm. Bladder unremarkable. No mass or adenopathy. Dilated proximal left inguinal canal containing fat consistent with inguinal hernia. Scattered degenerative disc disease changes thoracolumbar spine.  IMPRESSION: Bowel wall thickening of the ascending colon, cecum and terminal ileum compatible with enterocolitis, question inflammatory bowel disease/Crohn's disease versus infection or less likely ischemia. Hepatic and right renal cysts. 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 Significant prostatic enlargement. Tiny umbilical and left inguinal hernias.  Findings called to Dr. Manus Gunning on 05/05/2012 at 1658 hours.  Original Report Authenticated By: Lollie Marrow, M.D.     1. Sepsis   2. Renal failure   3. Colitis   4. Orthostatic hypotension       MDM  3 days of diarrhea poor by mouth intake, no appetite. Denies vomiting denies abdominal pain but endorses abdominal soreness. No chest pain or shortness of breath.  Creatinine 2.5 without comparison. Orthostatic vitals positive. Minimal abdominal pain. Continued aggressive fluid rehydration. Lactate normal. CT scan shows diffuse bowel wall thickening compatible with enterocolitis. Consider infection is inflammation. Doubt ischemia given normal lactate and no abdominal pain. Stool studies sent for C. difficile.  With leukocytosis, tachycardia and service of infection patient  will be admitted to ICU  No ICU or stepdown beds at [name].  Hospitalists request transfer to [facility].  D/w Dr. Toniann Fail.   Date: 05/05/2012  Rate: 102  Rhythm: atrial fibrillation  QRS Axis: normal  Intervals: normal  ST/T Wave abnormalities: nonspecific ST/T changes  Conduction Disutrbances:none  Narrative Interpretation: new atrial fibrillation  Old EKG Reviewed:  changes noted  CRITICAL CARE Performed by: [name]   Total critical care time: 40  Critical care time was exclusive of separately billable procedures and treating other patients.  Critical care was necessary to treat or prevent imminent or life-threatening deterioration.  Critical care was time spent personally by me on the following activities: development of treatment plan with patient and/or surrogate as well as nursing, discussions with consultants, evaluation of patient's response to treatment, examination of patient, obtaining history from patient or surrogate, ordering and performing treatments and interventions, ordering and review of laboratory studies, ordering and review of radiographic studies, pulse oximetry and re-evaluation of patient's condition. I personally performed the services described in this documentation, which was scribed in my presence.  The recorded information has been reviewed and considered.       Kenneth Octave, MD 05/05/12 2056

## 2012-05-05 NOTE — ED Notes (Signed)
Pt to the bathroom several times with runny stool. Pt refused a bedside commode.

## 2012-05-05 NOTE — ED Notes (Signed)
Patient placed on cardiac monitor.

## 2012-05-05 NOTE — ED Notes (Signed)
N/v/d for three days. Pt denies pain. States symptoms began with headache followed by chillls and aching. Pt then started having diarrhea. Denies abd pain.

## 2012-05-05 NOTE — ED Notes (Signed)
Showed Dr. Adriana Simas the prior EKG showing A-fib and notified him that patient was currently A-fib on the monitor. No new orders given.

## 2012-05-06 DIAGNOSIS — N400 Enlarged prostate without lower urinary tract symptoms: Secondary | ICD-10-CM | POA: Diagnosis present

## 2012-05-06 DIAGNOSIS — D696 Thrombocytopenia, unspecified: Secondary | ICD-10-CM | POA: Diagnosis present

## 2012-05-06 DIAGNOSIS — I10 Essential (primary) hypertension: Secondary | ICD-10-CM | POA: Diagnosis present

## 2012-05-06 DIAGNOSIS — R6510 Systemic inflammatory response syndrome (SIRS) of non-infectious origin without acute organ dysfunction: Secondary | ICD-10-CM | POA: Diagnosis present

## 2012-05-06 DIAGNOSIS — A419 Sepsis, unspecified organism: Secondary | ICD-10-CM

## 2012-05-06 DIAGNOSIS — I4891 Unspecified atrial fibrillation: Secondary | ICD-10-CM

## 2012-05-06 LAB — MRSA PCR SCREENING: MRSA by PCR: NEGATIVE

## 2012-05-06 LAB — CBC
MCH: 29.1 pg (ref 26.0–34.0)
MCHC: 34.8 g/dL (ref 30.0–36.0)
Platelets: 109 10*3/uL — ABNORMAL LOW (ref 150–400)
RDW: 14.7 % (ref 11.5–15.5)

## 2012-05-06 LAB — COMPREHENSIVE METABOLIC PANEL
ALT: 16 U/L (ref 0–53)
AST: 45 U/L — ABNORMAL HIGH (ref 0–37)
Alkaline Phosphatase: 43 U/L (ref 39–117)
CO2: 17 mEq/L — ABNORMAL LOW (ref 19–32)
Calcium: 7.5 mg/dL — ABNORMAL LOW (ref 8.4–10.5)
Chloride: 105 mEq/L (ref 96–112)
GFR calc Af Amer: 44 mL/min — ABNORMAL LOW (ref >90)
GFR calc non Af Amer: 38 mL/min — ABNORMAL LOW (ref >90)
Glucose, Bld: 113 mg/dL — ABNORMAL HIGH (ref 70–99)
Sodium: 137 mEq/L (ref 135–145)
Total Bilirubin: 0.4 mg/dL (ref 0.3–1.2)

## 2012-05-06 MED ORDER — ONDANSETRON HCL 4 MG PO TABS: 4.0000 mg | ORAL_TABLET | Freq: Four times a day (QID) | ORAL | Status: DC | PRN

## 2012-05-06 MED ORDER — METRONIDAZOLE IN NACL 5-0.79 MG/ML-% IV SOLN
500.0000 mg | Freq: Three times a day (TID) | INTRAVENOUS | Status: DC
  Administered 2012-05-06 – 2012-05-09 (×10): 500 mg via INTRAVENOUS
  Filled 2012-05-06 (×14): qty 100

## 2012-05-06 MED ORDER — CIPROFLOXACIN IN D5W 400 MG/200ML IV SOLN
400.0000 mg | INTRAVENOUS | Status: DC
  Administered 2012-05-06 – 2012-05-08 (×2): 400 mg via INTRAVENOUS
  Filled 2012-05-06 (×4): qty 200

## 2012-05-06 MED ORDER — ONDANSETRON HCL 4 MG/2ML IJ SOLN: 4.0000 mg | Freq: Four times a day (QID) | INTRAMUSCULAR | Status: DC | PRN

## 2012-05-06 MED ORDER — HYDROCODONE-ACETAMINOPHEN 5-325 MG PO TABS
1.0000 | ORAL_TABLET | ORAL | Status: DC | PRN
  Administered 2012-05-09: 1 via ORAL
  Filled 2012-05-06: qty 1

## 2012-05-06 MED ORDER — POTASSIUM CHLORIDE IN NACL 20-0.9 MEQ/L-% IV SOLN
INTRAVENOUS | Status: AC
  Administered 2012-05-06: 100 mL/h via INTRAVENOUS
  Administered 2012-05-06: 02:00:00 via INTRAVENOUS
  Filled 2012-05-06 (×3): qty 1000

## 2012-05-06 MED ORDER — DOXYCYCLINE HYCLATE 100 MG PO TABS
100.0000 mg | ORAL_TABLET | Freq: Two times a day (BID) | ORAL | Status: DC
  Administered 2012-05-06 – 2012-05-11 (×11): 100 mg via ORAL
  Filled 2012-05-06 (×13): qty 1

## 2012-05-06 MED ORDER — ALUM & MAG HYDROXIDE-SIMETH 200-200-20 MG/5ML PO SUSP
30.0000 mL | Freq: Four times a day (QID) | ORAL | Status: DC | PRN
  Filled 2012-05-06: qty 30

## 2012-05-06 NOTE — Progress Notes (Signed)
[facility] Progress Note [place] TEAM 1 - Stepdown/ICU TEAM   [name] ZOX:[id number] DOB: Nov 27, 1934 DOA: 05/05/2012 PCP: Kirk Ruths, MD  Brief narrative: 77 year old male with a history of coronary artery disease status post CABG who is in otherwise healthy condition who over the last 3 days who has had significant amount of watery diarrhea and no appetite with barely any eating or drinking. He has been having some vague lower abdominal pain. He denies any nausea or vomiting. He says over the last several days he's may hardly any urine. It is taken his wife several days to finally convince him to come to the hospital. He's been experiencing a significant amount of dizziness and progressive worsening weakness when trying to walk over the last 24-48 hours. He's been running fevers, having a significant headache. 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 He denies any rashes. He does not have any kidney issues that he knows of. He denies any chest pain or shortness of breath.  Assessment/Plan: Principal Problem:  *Acute colitis/Diarrhea *C. difficile PCR negative *Stool culture pending *CT scan this admission shows bowel wall thickening of the descending colon, cecum and terminal ileum compatible with enterocolitis differential also includes possible inflammatory bowel disease/Crohn's disease versus infection is less likely ischemia  *Patient's symptoms are acute in nature and diarrhea is nonbloody so doubt inflammatory bowel disease or ischemia *Could be a case of severe viral gastroenteritis we'll continue to monitor and provide supportive care  Active Problems:  Acute renal failure/Dehydration/SIRS *Baseline creatinine unknown *Presented with a BUN in the 40s and a creatinine of 2.55 and after hydration BUN and creatinine are trending downward *Continue to follow electrolyte panel and continue supportive care *Was on  lisinopril prior to admission and this certainly contributed to progressive renal dysfunction in the setting of dehydration   Abdominal pain, right lower quadrant *Consistant with enterocolitis findings on CT scan * patient endorses newly resolved abdominal pain   Atrial fibrillation with controlled ventricular response (NEW) * no prior history of atrial fibrillation currently rate controlled * [place] score of 2 *Suspect related to dehydration and acute illness to have a low threshold for initiating Coumadin at this point  *No prior echocardiogram in the system so will order one now.    Fever/Headache/Tick bite *Multiple tick bites without evidence of wound abscess  *No other rashes consistent with Lyme disease or Rocky none spotted fever  *Empiric treatment as a precaution  *RMSF titer has been obtained but usually would not demonstrate positive status until 10-14 days after tick bite and symptoms    Thrombocytopenia *Platelets 144,000 at presentation and had decreased to 109,000 *Likely thrombocytopenia indicative of degree of dehydration and inflammatory response at presentation but could be indicative of a gram-negative infection *Urinalysis was not consistent with UTI only ATN    S/P CABG x 4 *No ischemic changes on EKG but history of coronary disease another risk factor regarding his development of atrial fibrillation    BPH (benign prostatic hyperplasia) *On Flomax prior to admission but this is being held secondary to suboptimal blood pressures  *Currently appears to be voiding without difficulty but once blood pressure able to tolerate would quickly resume Flomax     Code Status:  full Family Communication:  patient's wife and other family members in room during exam and rales this morning -full update given on patient's current status  Disposition Plan:  remain in step down  Consultants:  none   Procedures:  none   Antibiotics:  Cipro started 05/05/2012   Doxycycline started 05/05/2012  Flagyl started 05/05/2012   HPI/Subjective:  patient alert and now endorsing feeling much better than prior to admission. Denies dizziness while seated at the side of the bed and with ambulation. States is hungry but then admits was unable to tolerate Jell-O without abdominal discomfort and nausea    Objective: Blood pressure 111/66, pulse 91, temperature 97.7 F (36.5 C), temperature source Oral, resp. rate 19, height 5\' 6"  (1.676 m), weight 93.7 kg (206 lb 9.1 oz), SpO2 96.00%.  Intake/Output Summary (Last 24 hours) at 05/06/12 1527 Last data filed at 05/06/12 0900  Gross per 24 hour  Intake   1160 ml  Output      0 ml  Net   1160 ml     Exam: General: No acute respiratory distress, appears stated age  Lungs: Clear to auscultation bilaterally without wheezes or crackles, room air  Cardiovascular: Iregular rate and atrial fibrillation rhythm which is rate controlled, no murmur gallop or rub normal S1 and S2, IV fluids infusing at 100 cc per hour, no edema or cyanosis Abdomen: Nontender, nondistended, soft, bowel sounds positive, no rebound, no ascites, no appreciable mass Musculoskeletal: No significant cyanosis, clubbing Neurological: Patient alert and oriented x3, moving all extremities x4, exam nonfocal   Data Reviewed: Basic Metabolic Panel:  Lab 05/06/12 1610 05/05/12 1316  NA 137 132*  K 4.6 3.4*  CL 105 95*  CO2 17* 21  GLUCOSE 113* 152*  BUN 42* 43*  CREATININE 1.69* 2.55*  CALCIUM 7.5* 9.5  MG -- --  PHOS -- --   Liver Function Tests:  Lab 05/06/12 0400 05/05/12 1316  AST 45* 25  ALT 16 16  ALKPHOS 43 65  BILITOT 0.4 0.7  PROT 6.3 7.8  ALBUMIN 2.9* 3.8   No results found for this basename: LIPASE:5,AMYLASE:5 in the last 168 hours No results found for this basename: AMMONIA:5 in the last 168 hours CBC:  Lab 05/06/12 0400 05/05/12 1316  WBC 9.9 16.0*  NEUTROABS -- 14.2*  HGB 15.4 17.8*  HCT 44.2 51.5  MCV 83.6 85.3   PLT 109* 144*   Cardiac Enzymes:  Lab 05/05/12 1316  CKTOTAL 200  CKMB 1.7  CKMBINDEX --  TROPONINI <0.30   BNP (last 3 results) No results found for this basename: PROBNP:3 in the last 8760 hours CBG: No results found for this basename: GLUCAP:5 in the last 168 hours  Recent Results (from the past 240 hour(s))  CLOSTRIDIUM DIFFICILE BY PCR     Status: Normal   Collection Time   05/05/12  3:15 PM      Component Value Range Status Comment   C difficile by pcr NEGATIVE  NEGATIVE Final   MRSA PCR SCREENING     Status: Normal   Collection Time   05/06/12 12:31 AM      Component Value Range Status Comment   MRSA by PCR NEGATIVE  NEGATIVE Final      Studies:  Recent x-ray studies have been reviewed in detail by the Attending Physician  Scheduled Meds:  Reviewed in detail by the Attending Physician   Junious Silk, ANP [facility] Office  [phone] Pager [phone]  On-Call/Text Page:      [email or web address]      password [email or web address]  If 7PM-7AM, please contact night-coverage [email or web address] Password [email or web address] 05/06/2012, 3:27 PM   LOS: 1 day   I have examined the patient,  reviewed the chart and modified the above note which I agree with.  Calvert Cantor, MD [phone]

## 2012-05-06 NOTE — Progress Notes (Signed)
Utilization review completed.  

## 2012-05-07 LAB — BASIC METABOLIC PANEL
BUN: 24 mg/dL — ABNORMAL HIGH (ref 6–23)
Calcium: 8.1 mg/dL — ABNORMAL LOW (ref 8.4–10.5)
Creatinine, Ser: 1.18 mg/dL (ref 0.50–1.35)
GFR calc Af Amer: 68 mL/min — ABNORMAL LOW (ref >90)
GFR calc non Af Amer: 59 mL/min — ABNORMAL LOW (ref >90)
Glucose, Bld: 103 mg/dL — ABNORMAL HIGH (ref 70–99)
Potassium: 3.4 mEq/L — ABNORMAL LOW (ref 3.5–5.1)

## 2012-05-07 LAB — PROTIME-INR: Prothrombin Time: 15.3 seconds — ABNORMAL HIGH (ref 11.6–15.2)

## 2012-05-07 LAB — TSH: TSH: 2.41 u[IU]/mL (ref 0.350–4.500)

## 2012-05-07 MED ORDER — TAMSULOSIN HCL 0.4 MG PO CAPS: 0.4000 mg | ORAL_CAPSULE | Freq: Every day | ORAL | Status: DC

## 2012-05-07 MED ORDER — TAMSULOSIN HCL 0.4 MG PO CAPS
0.4000 mg | ORAL_CAPSULE | Freq: Every day | ORAL | Status: DC
  Administered 2012-05-08 – 2012-05-10 (×2): 0.4 mg via ORAL
  Filled 2012-05-07 (×4): qty 1

## 2012-05-07 MED ORDER — SODIUM CHLORIDE 0.9 % IV SOLN
INTRAVENOUS | Status: DC
  Administered 2012-05-07: 75 mL/h via INTRAVENOUS
  Administered 2012-05-08 – 2012-05-11 (×5): via INTRAVENOUS

## 2012-05-07 MED ORDER — HEPARIN BOLUS VIA INFUSION
4000.0000 [IU] | Freq: Once | INTRAVENOUS | Status: AC
  Administered 2012-05-07: 4000 [IU] via INTRAVENOUS
  Filled 2012-05-07: qty 4000

## 2012-05-07 MED ORDER — HEPARIN (PORCINE) IN NACL 100-0.45 UNIT/ML-% IJ SOLN
1200.0000 [IU]/h | INTRAMUSCULAR | Status: DC
  Administered 2012-05-07: 900 [IU]/h via INTRAVENOUS
  Administered 2012-05-08: 1200 [IU]/h via INTRAVENOUS
  Filled 2012-05-07 (×2): qty 250

## 2012-05-07 MED ORDER — TAMSULOSIN HCL 0.4 MG PO CAPS
0.4000 mg | ORAL_CAPSULE | Freq: Once | ORAL | Status: AC
  Administered 2012-05-07: 0.4 mg via ORAL
  Filled 2012-05-07: qty 1

## 2012-05-07 MED ORDER — POTASSIUM CHLORIDE CRYS ER 20 MEQ PO TBCR
40.0000 meq | EXTENDED_RELEASE_TABLET | Freq: Once | ORAL | Status: AC
  Administered 2012-05-07: 40 meq via ORAL
  Filled 2012-05-07: qty 2

## 2012-05-07 NOTE — Progress Notes (Signed)
[facility] Progress Note [place] TEAM 1 - Stepdown/ICU TEAM   [name]:[id number] DOB: 1935/07/30 DOA: 05/05/2012 PCP: Kirk Ruths, MD  Brief narrative: 77 year old male with a history of coronary artery disease status post CABG who is in otherwise healthy condition who over the last 3 days who has had significant amount of watery diarrhea and no appetite with barely any eating or drinking. He has been having some vague lower abdominal pain. He denies any nausea or vomiting. He says over the last several days he's may hardly any urine. It is taken his wife several days to finally convince him to come to the hospital. He's been experiencing a significant amount of dizziness and progressive worsening weakness when trying to walk over the last 24-48 hours. He's been running fevers, having a significant headache. 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 He denies any rashes. He does not have any kidney issues that he knows of. He denies any chest pain or shortness of breath.  Assessment/Plan: Principal Problem:  *Acute colitis/Diarrhea *C. difficile PCR negative *Stool culture pending *Diarrhea has slowed down so will attempt to transition from clear liquids to full liquids but continue IV fluids at a lower rate *CT scan this admission shows bowel wall thickening of the descending colon, cecum and terminal ileum compatible with enterocolitis differential also includes possible inflammatory bowel disease/Crohn's disease versus infection is less likely ischemia  *Patient's symptoms are acute in nature and diarrhea is nonbloody so doubt inflammatory bowel disease or ischemia *Could be a case of severe viral gastroenteritis we'll continue to monitor and provide supportive care  Active Problems:  Acute renal failure/Dehydration/SIRS *Baseline creatinine unknown but current creatinine has decreased to 1.18 and BUN has decreased from 42  to 24 so appears near euvolemic at this *Continue to follow electrolyte panel and continue supportive care *Was on lisinopril prior to admission and this certainly contributed to progressive renal dysfunction in the setting of dehydration   Abdominal pain, right lower quadrant *Consistant with enterocolitis findings on CT scan * patient endorses abdominal pain which is now improving   Atrial fibrillation with controlled ventricular response (NEW) * no prior history of atrial fibrillation currently rate controlled * [place] score of 2 *Suspect related to dehydration and acute illness to have a low threshold for initiating Coumadin at this point  *Echocardiogram pending *Patient has seen a cardiologist with [facility] remotely so we will ask them to see the patient in consultation this admission *TSH is normal   Fever/Headache/Tick bite *Multiple tick bites without evidence of wound abscess  *No other rashes consistent with Lyme disease or Rocky none spotted fever  *Empiric treatment as a precaution  *RMSF titer has been obtained but usually would not demonstrate positive status until 10-14 days after tick bite and symptoms    Thrombocytopenia *Platelets 144,000 at presentation and had decreased to 109,000-repeat CBC in the morning *Likely thrombocytopenia indicative of degree of dehydration and inflammatory response at presentation but could be indicative of a gram-negative infection *Urinalysis was not consistent with UTI- only ATN    S/P CABG x 4 *No ischemic changes on EKG but history of coronary disease another risk factor regarding his development of atrial fibrillation    BPH (benign prostatic hyperplasia) *On Flomax prior to admission and initially this medication was failed the patient's blood pressure has rebounding and he is complaining of difficulty voiding so we will resume this  medication today 05/07/2012   Code Status:  Full Family Communication:   Patient's wife and other family members in room during exam and rales this morning -full update given on patient's current status on 05/06/2012 Disposition Plan: Transfer to telemetry  Consultants:  none   Procedures:  none   Antibiotics:  Cipro started 05/05/2012  Doxycycline started 05/05/2012  Flagyl started 05/05/2012   HPI/Subjective: Remains alert and endorses feeling much better than prior to admission. Still with some abdominal pain and diarrhea but requesting bacon and eggs. No chest pain or shortness of breath   Objective: Blood pressure 127/79, pulse 104, temperature 98.1 F (36.7 C), temperature source Oral, resp. rate 18, height 5\' 6"  (1.676 m), weight 93.7 kg (206 lb 9.1 oz), SpO2 95.00%.  Intake/Output Summary (Last 24 hours) at 05/07/12 1349 Last data filed at 05/07/12 0600  Gross per 24 hour  Intake   1060 ml  Output    500 ml  Net    560 ml     Exam: General: No acute respiratory distress, appears stated age  Lungs: Clear to auscultation bilaterally without wheezes or crackles, room air  Cardiovascular: Iregular rate and atrial fibrillation rhythm which is rate controlled, no murmur gallop or rub normal S1 and S2, IV fluids infusing at 50 cc per hour, no edema or cyanosis Abdomen: Nontender, nondistended, soft, bowel sounds positive, no rebound, no ascites, no appreciable mass Musculoskeletal: No significant cyanosis, clubbing Neurological: Patient alert and oriented x3, moving all extremities x4, exam nonfocal   Data Reviewed: Basic Metabolic Panel:  Lab 05/07/12 1191 05/06/12 0400 05/05/12 1316  NA 139 137 132*  K 3.4* 4.6 3.4*  CL 108 105 95*  CO2 19 17* 21  GLUCOSE 103* 113* 152*  BUN 24* 42* 43*  CREATININE 1.18 1.69* 2.55*  CALCIUM 8.1* 7.5* 9.5  MG -- -- --  PHOS -- -- --   Liver Function Tests:  Lab 05/06/12 0400 05/05/12 1316  AST 45* 25  ALT 16 16  ALKPHOS 43 65  BILITOT 0.4 0.7  PROT 6.3 7.8  ALBUMIN 2.9* 3.8   No results  found for this basename: LIPASE:5,AMYLASE:5 in the last 168 hours No results found for this basename: AMMONIA:5 in the last 168 hours CBC:  Lab 05/06/12 0400 05/05/12 1316  WBC 9.9 16.0*  NEUTROABS -- 14.2*  HGB 15.4 17.8*  HCT 44.2 51.5  MCV 83.6 85.3  PLT 109* 144*   Cardiac Enzymes:  Lab 05/05/12 1316  CKTOTAL 200  CKMB 1.7  CKMBINDEX --  TROPONINI <0.30   BNP (last 3 results) No results found for this basename: PROBNP:3 in the last 8760 hours CBG: No results found for this basename: GLUCAP:5 in the last 168 hours  Recent Results (from the past 240 hour(s))  CLOSTRIDIUM DIFFICILE BY PCR     Status: Normal   Collection Time   05/05/12  3:15 PM      Component Value Range Status Comment   C difficile by pcr NEGATIVE  NEGATIVE Final   STOOL CULTURE     Status: Normal (Preliminary result)   Collection Time   05/05/12  3:15 PM      Component Value Range Status Comment   Specimen Description STOOL   Final    Special Requests NONE   Final    Culture Culture reincubated for better growth   Final    Report Status PENDING   Incomplete   MRSA PCR SCREENING     Status: Normal  Collection Time   05/06/12 12:31 AM      Component Value Range Status Comment   MRSA by PCR NEGATIVE  NEGATIVE Final      Studies:  Recent x-ray studies have been reviewed in detail by the Attending Physician  Scheduled Meds:  Reviewed in detail by the Attending Physician   Junious Silk, ANP [facility] Office  [phone] Pager [phone]  On-Call/Text Page:      [email or web address]      password [email or web address]  If 7PM-7AM, please contact night-coverage [email or web address] Password [email or web address] 05/07/2012, 1:49 PM   LOS: 2 days   I have examined the patient and reviewed the chart. I have modified the above note and agree with it.   Calvert Cantor, MD [phone]

## 2012-05-07 NOTE — Progress Notes (Signed)
Will resume patient on Flomax per patient's request. Experiencing difficulty getting his urine flow going again. Flomax held due to low B/P. Notified Junious Silk, PA about patient's concern. Blood Pressure remains good so we will begin it today. Patient is allittle uncomfortable at present but doses not want to be catherize.

## 2012-05-07 NOTE — Consult Note (Addendum)
Reason for Consult: Afib RVR Referring Physician:   QUENTAVIOUS RITTENHOUSE is an 77 y.o. male.  HPI:   Patient is a 77 year old Caucasian male, who has not been seen in our office since October 2010, with a history of coronary artery disease and coronary artery bypass grafting x4 in February 2007 by Dr. Zenaida Niece Trigt.(left internal mammary artery to LAD, saphenous vein graft to diagonal, saphenous vein graft to ramus intermediate, saphenous vein graft to distal right coronary artery).  His history also includes paroxysmal atrial fibrillation, hypertension, hyperlipidemia.  He had been taking amiodarone for control of his PAF however, the patient self terminated that medication back in 2010.    The patient presented on July 17 after experiencing several days of watery diarrhea and abdominal pain. He states he had not been drinking or eating much during that period.  Current complaints include chronic right lower extremity edema, difficulty starting his urinary stream however he is not been on Flomax for a few days. He denies nausea, vomiting, palpitations, cough, congestion, chest pain, shortness of breath, orthopnea, PND, hematochezia.  EKG in the emergency room any time showed atrial fibrillation with rapid ventricular response at a rate of approximately 102.  He was started on Flagyl and Cipro and hydrated with 3 L of fluid.  Serum creatinine was elevated upon admission as well and has improved significantly.    Past Medical History  Diagnosis Date  . Hypertension   . Coronary artery disease     Past Surgical History  Procedure Date  . Cardiac surgery   . Tonsillectomy     History reviewed. No pertinent family history.  Social History:  reports that he has never smoked. He does not have any smokeless tobacco history on file. He reports that he does not drink alcohol or use illicit drugs.  Allergies:  Allergies  Allergen Reactions  . Sulfa Antibiotics Swelling    Medications:     .  ciprofloxacin  400 mg Intravenous Q24H  . doxycycline  100 mg Oral Q12H  . metronidazole  500 mg Intravenous Q8H  . Tamsulosin HCl  0.4 mg Oral Once  . Tamsulosin HCl  0.4 mg Oral QPC supper  . DISCONTD: Tamsulosin HCl  0.4 mg Oral QPC supper     Results for orders placed during the hospital encounter of 05/05/12 (from the past 48 hour(s))  CLOSTRIDIUM DIFFICILE BY PCR     Status: Normal   Collection Time   05/05/12  3:15 PM      Component Value Range Comment   C difficile by pcr NEGATIVE  NEGATIVE   STOOL CULTURE     Status: Normal (Preliminary result)   Collection Time   05/05/12  3:15 PM      Component Value Range Comment   Specimen Description STOOL      Special Requests NONE      Culture Culture reincubated for better growth      Report Status PENDING     URINALYSIS, ROUTINE W REFLEX MICROSCOPIC     Status: Abnormal   Collection Time   05/05/12  9:47 PM      Component Value Range Comment   Color, Urine AMBER (*) YELLOW BIOCHEMICALS MAY BE AFFECTED BY COLOR   APPearance CLEAR  CLEAR    Specific Gravity, Urine >1.030 (*) 1.005 - 1.030    pH 5.5  5.0 - 8.0    Glucose, UA NEGATIVE  NEGATIVE mg/dL    Hgb urine dipstick MODERATE (*) NEGATIVE  Bilirubin Urine NEGATIVE  NEGATIVE    Ketones, ur NEGATIVE  NEGATIVE mg/dL    Protein, ur 30 (*) NEGATIVE mg/dL    Urobilinogen, UA 0.2  0.0 - 1.0 mg/dL    Nitrite NEGATIVE  NEGATIVE    Leukocytes, UA NEGATIVE  NEGATIVE   URINE MICROSCOPIC-ADD ON     Status: Abnormal   Collection Time   05/05/12  9:47 PM      Component Value Range Comment   Squamous Epithelial / LPF RARE  RARE    WBC, UA 3-6  <3 WBC/hpf    RBC / HPF 3-6  <3 RBC/hpf    Bacteria, UA FEW (*) RARE    Casts GRANULAR CAST (*) NEGATIVE   MRSA PCR SCREENING     Status: Normal   Collection Time   05/06/12 12:31 AM      Component Value Range Comment   MRSA by PCR NEGATIVE  NEGATIVE   COMPREHENSIVE METABOLIC PANEL     Status: Abnormal   Collection Time   05/06/12  4:00 AM        Component Value Range Comment   Sodium 137  135 - 145 mEq/L    Potassium 4.6  3.5 - 5.1 mEq/L HEMOLYSIS AT THIS LEVEL MAY AFFECT RESULT   Chloride 105  96 - 112 mEq/L    CO2 17 (*) 19 - 32 mEq/L    Glucose, Bld 113 (*) 70 - 99 mg/dL    BUN 42 (*) 6 - 23 mg/dL    Creatinine, Ser 1.61 (*) 0.50 - 1.35 mg/dL    Calcium 7.5 (*) 8.4 - 10.5 mg/dL    Total Protein 6.3  6.0 - 8.3 g/dL    Albumin 2.9 (*) 3.5 - 5.2 g/dL    AST 45 (*) 0 - 37 U/L HEMOLYSIS AT THIS LEVEL MAY AFFECT RESULT   ALT 16  0 - 53 U/L HEMOLYSIS AT THIS LEVEL MAY AFFECT RESULT   Alkaline Phosphatase 43  39 - 117 U/L HEMOLYSIS AT THIS LEVEL MAY AFFECT RESULT   Total Bilirubin 0.4  0.3 - 1.2 mg/dL    GFR calc non Af Amer 38 (*) >90 mL/min    GFR calc Af Amer 44 (*) >90 mL/min   CBC     Status: Abnormal   Collection Time   05/06/12  4:00 AM      Component Value Range Comment   WBC 9.9  4.0 - 10.5 K/uL    RBC 5.29  4.22 - 5.81 MIL/uL    Hemoglobin 15.4  13.0 - 17.0 g/dL    HCT 09.6  04.5 - 40.9 %    MCV 83.6  78.0 - 100.0 fL    MCH 29.1  26.0 - 34.0 pg    MCHC 34.8  30.0 - 36.0 g/dL    RDW 81.1  91.4 - 78.2 %    Platelets 109 (*) 150 - 400 K/uL PLATELET CLUMPS NOTED ON SMEAR, COUNT APPEARS DECREASED  OCCULT BLOOD X 1 CARD TO LAB, STOOL     Status: Normal   Collection Time   05/06/12 10:30 PM      Component Value Range Comment   Fecal Occult Bld NEGATIVE     TSH     Status: Normal   Collection Time   05/07/12  4:24 AM      Component Value Range Comment   TSH 2.410  0.350 - 4.500 uIU/mL   BASIC METABOLIC PANEL     Status: Abnormal   Collection  Time   05/07/12 11:55 AM      Component Value Range Comment   Sodium 139  135 - 145 mEq/L    Potassium 3.4 (*) 3.5 - 5.1 mEq/L DELTA CHECK NOTED   Chloride 108  96 - 112 mEq/L    CO2 19  19 - 32 mEq/L    Glucose, Bld 103 (*) 70 - 99 mg/dL    BUN 24 (*) 6 - 23 mg/dL DELTA CHECK NOTED   Creatinine, Ser 1.18  0.50 - 1.35 mg/dL    Calcium 8.1 (*) 8.4 - 10.5 mg/dL    GFR  calc non Af Amer 59 (*) >90 mL/min    GFR calc Af Amer 68 (*) >90 mL/min     Ct Abdomen Pelvis Wo Contrast  05/05/2012  *RADIOLOGY REPORT*  Clinical Data: Nausea, vomiting, diarrhea, history hypertension, coronary artery disease post MI and CABG  CT ABDOMEN AND PELVIS WITHOUT CONTRAST  Technique:  Multidetector CT imaging of the abdomen and pelvis was performed following the standard protocol without intravenous contrast. The patient drank dilute oral contrast for the exam. Sagittal and coronal MPR images reconstructed from axial data set.  Comparison: None  Findings: Prior median sternotomy. Lung bases clear. Probable small liver cysts, largest 2.5 x 1.6 cm image 16. Cyst at inferior pole right kidney 2.0 x 2.0 cm image 37. Liver, spleen, kidneys, and at right adrenal gland otherwise normal appearance for noncontrast study. Small amount of free fluid adjacent to inferior right lobe of liver. Small left adrenal myelolipoma 1.4 x 1.1 cm containing foci of fat. Small lipoma within proximal tail of pancreas, 1.9 x 1.1 cm image 24.  Scattered atherosclerotic calcifications aorta and branch vessels. Bowel wall thickening identified at ascending colon, cecum and terminal ileum with mild edema and nonspecific normal-sized lymph nodes within adjacent mesentery. This results in a somewhat prominent appearance of the ileocecal valve which is of fat attenuation. Findings are most consistent with an enterocolitis. No evidence of perforation or abscess. Remaining large and small bowel loops unremarkable without obstruction.  Incompletely distended stomach. Tiny umbilical hernia containing fat. Prostatic enlargement, gland 5.38 x 5.1 x 5.2 cm. Bladder unremarkable. No mass or adenopathy. Dilated proximal left inguinal canal containing fat consistent with inguinal hernia. Scattered degenerative disc disease changes thoracolumbar spine.  IMPRESSION: Bowel wall thickening of the ascending colon, cecum and terminal ileum  compatible with enterocolitis, question inflammatory bowel disease/Crohn's disease versus infection or less likely ischemia. Hepatic and right renal cysts. 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 Significant prostatic enlargement. Tiny umbilical and left inguinal hernias.  Findings called to Dr. Manus Gunning on 05/05/2012 at 1658 hours.  Original Report Authenticated By: Lollie Marrow, M.D.    Review of Systems  Constitutional: Negative for fever and diaphoresis.  HENT: Negative for congestion.   Eyes: Negative for blurred vision and double vision.  Respiratory: Positive for cough (At night from sinus drainage.). Negative for shortness of breath and wheezing.   Cardiovascular: Negative for chest pain, palpitations, orthopnea, claudication, leg swelling and PND.  Gastrointestinal: Positive for abdominal pain (Mild) and diarrhea (Getting better). Negative for nausea, vomiting, constipation and blood in stool.  Genitourinary: Negative for dysuria.       Difficulty starting stream.  Musculoskeletal: Negative for myalgias.  Neurological: Negative for dizziness.   Blood pressure 127/79, pulse 104, temperature 98.1 F (36.7 C), temperature source Oral, resp. rate 18, height 5\' 6"  (1.676 m), weight 93.7 kg (206 lb 9.1 oz), SpO2 95.00%. Physical Exam  Constitutional: He is oriented to person, place, and time. He appears well-developed and well-nourished. No distress.  HENT:  Head: Normocephalic and atraumatic.  Eyes: EOM are normal. Pupils are equal, round, and reactive to light. No scleral icterus.  Neck: Neck supple. No JVD present.  Cardiovascular: Normal rate.  An irregular rhythm present.  No murmur heard. Pulses:      Radial pulses are 2+ on the right side, and 2+ on the left side.       Dorsalis pedis pulses are 1+ on the right side, and 1+ on the left side.       Feet are cool.  No carotid bruits.  Respiratory: Effort normal and breath sounds normal. He has no wheezes. He has no  rales.  GI: Soft. There is tenderness.       BS hyperactive  Musculoskeletal:       Trace of right LEE.  Lymphadenopathy:    He has no cervical adenopathy.  Neurological: He is alert and oriented to person, place, and time. He exhibits normal muscle tone.  Skin: Skin is warm and dry.  Psychiatric: He has a normal mood and affect.    Assessment/Plan: Patient Active Hospital Problem List: Acute colitis (05/05/2012) Atrial fibrillation with controlled ventricular response CAD S/P CABGx4 in 2007 (LIMA to LAD, SVG to diagonal, SVG to ramus intermediate, SVG to distal RCA) Diarrhea (05/05/2012) Acute renal failure (05/05/2012) Dehydration (05/05/2012) Fever (05/05/2012) Headache (05/05/2012) Tick bite (05/05/2012) Abdominal pain, right lower quadrant (05/05/2012) Dizziness - light-headed (05/05/2012) BPH (benign prostatic hyperplasia) (05/06/2012) Thrombocytopenia (05/06/2012) SIRS (systemic inflammatory response syndrome) (05/06/2012)  Plan:  It is likely the patient's afib was triggered by the dehydration.  His rate was controlled while I was in the room and has been going just over 100.   He was in it when he [facility] to [facility] ER [facility].   His BP has now improved since receiving fluids.  The patient's CHADSVASc score is 4.  Will initiate IV heparin.  Consider starting coumadin/Xarelto.  Cost will be a concern when deciding.  If he does not convert on his own, we need to consider TEE/DCCV or possibly restarting amiodarone(This may be a med that made him feel bad). 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  Potassium needs to be repleted.  Echo read pending.   [name], [name] 05/07/2012, 2:44 PM   I have seen and examined the patient along with Wilburt Finlay, PA.  I have reviewed the chart, notes and new data.  I agree with PA's note.  His echocardiogram shows minimal abnormalities: normal LV function and no major valve disease. The left atrium  is only mildly dilated and he should be able to help him maintain SR. Heparin to warfarin, then consider TEE CV on Monday. Can try Multaq or sotalol Rx if he agrees.  Thurmon Fair, MD, [facility] [phone] 05/07/2012, 4:41 PM

## 2012-05-07 NOTE — Progress Notes (Signed)
Patient is being transferred to room 6742 via wheelchair. Phone report called to Luv, Charity fundraiser. Patient and wife are aware of the transfer.

## 2012-05-07 NOTE — Progress Notes (Addendum)
  Echocardiogram 2D Echocardiogram with Definity has been performed.  [name] [name] 05/07/2012, 10:21 AM

## 2012-05-07 NOTE — Progress Notes (Signed)
Wife called Clinical research associate into patient's room and Web designer she had found 2 ticks on the patient. One on the rt. Lower leg and a very tiny one embedded in the lft. Lower leg. Both have been removed entirely except for the one on the left leg, charge nurse thinks a little leg still present in skin. Alcohol soaked cotton ball applied.

## 2012-05-07 NOTE — Progress Notes (Signed)
Received pt. As a transfer from 3000,pt. Alert and oriented,ambulatory,from home with his wife.pt. Ambulates independently,without equipment.pt. Was place on tele box # [phone].keep monitoring pt. And assessing his needs.

## 2012-05-07 NOTE — Progress Notes (Signed)
ANTICOAGULATION CONSULT NOTE - Initial Consult  Pharmacy Consult for Heparin Indication: atrial fibrillation  Allergies  Allergen Reactions  . Sulfa Antibiotics Swelling    Patient Measurements: Height: 5\' 6"  (167.6 cm) Weight: 206 lb 9.1 oz (93.7 kg) IBW/kg (Calculated) : 63.8  Heparin Dosing Weight 73.8 kg  Vital Signs: Temp: 98.1 F (36.7 C) (07/19 1200) Temp src: Oral (07/19 1200) BP: 127/79 mmHg (07/19 1210) Pulse Rate: 104  (07/19 1210)  Labs:  Basename 05/07/12 1155 05/06/12 0400 05/05/12 1316  HGB -- 15.4 17.8*  HCT -- 44.2 51.5  PLT -- 109* 144*  APTT -- -- --  LABPROT -- -- --  INR -- -- --  HEPARINUNFRC -- -- --  CREATININE 1.18 1.69* 2.55*  CKTOTAL -- -- 200  CKMB -- -- 1.7  TROPONINI -- -- <0.30    Estimated Creatinine Clearance: 58 ml/min (by C-G formula based on Cr of 1.18).   Medical History: Past Medical History  Diagnosis Date  . Hypertension   . Coronary artery disease     Medications:  Prescriptions prior to admission  Medication Sig Dispense Refill  . aspirin EC 81 MG tablet Take 81 mg by mouth daily.      Marland Kitchen bismuth subsalicylate (PEPTO BISMOL) 262 MG chewable tablet Chew 524 mg by mouth as needed. For indigestion      . ibuprofen (ADVIL,MOTRIN) 200 MG tablet Take 400 mg by mouth every 6 (six) hours as needed. For headache      . LISINOPRIL PO Take 0.5 tablets by mouth daily.      . Tamsulosin HCl (FLOMAX) 0.4 MG CAPS Take 0.4 mg by mouth daily after supper.       Scheduled:    . ciprofloxacin  400 mg Intravenous Q24H  . doxycycline  100 mg Oral Q12H  . metronidazole  500 mg Intravenous Q8H  . potassium chloride  40 mEq Oral Once  . Tamsulosin HCl  0.4 mg Oral Once  . Tamsulosin HCl  0.4 mg Oral QPC supper  . DISCONTD: Tamsulosin HCl  0.4 mg Oral QPC supper   Infusions:    . sodium chloride 75 mL/hr at 05/07/12 1200   Anti-infectives     Start     Dose/Rate Route Frequency Ordered Stop   05/06/12 1800   ciprofloxacin  (CIPRO) IVPB 400 mg        400 mg 200 mL/hr over 60 Minutes Intravenous Every 24 hours 05/06/12 0012     05/06/12 0400   metroNIDAZOLE (FLAGYL) IVPB 500 mg        500 mg 100 mL/hr over 60 Minutes Intravenous 3 times per day 05/06/12 0012     05/06/12 0045   doxycycline (VIBRA-TABS) tablet 100 mg        100 mg Oral Every 12 hours 05/06/12 0012     05/05/12 1715   ciprofloxacin (CIPRO) IVPB 400 mg        400 mg 200 mL/hr over 60 Minutes Intravenous  Once 05/05/12 1703 05/05/12 1933   05/05/12 1715   metroNIDAZOLE (FLAGYL) IVPB 500 mg        500 mg 100 mL/hr over 60 Minutes Intravenous  Once 05/05/12 1703 05/05/12 2034          Assessment: 77 year old male with a history of coronary artery disease status post CABG who is in otherwise healthy condition who over the last 3 days who has had significant amount of watery diarrhea and no appetite with barely any eating or drinking  Now patient has Atrial Fib  Heparin to be started Goal of Therapy:  Heparin level 0.3-0.7 units/ml Monitor platelets by anticoagulation protocol: Yes   Plan:  Heparin 4000 unit IV bolus Heparin drip at 900 units/hr 6hr Hepain level Baseline ptt/pt/inr Daily heparin level and CBC  Lucille Passy 05/07/2012,3:26 PM

## 2012-05-08 DIAGNOSIS — N400 Enlarged prostate without lower urinary tract symptoms: Secondary | ICD-10-CM

## 2012-05-08 LAB — HEPARIN LEVEL (UNFRACTIONATED): Heparin Unfractionated: 0.31 IU/mL (ref 0.30–0.70)

## 2012-05-08 LAB — BASIC METABOLIC PANEL
BUN: 19 mg/dL (ref 6–23)
CO2: 18 mEq/L — ABNORMAL LOW (ref 19–32)
Calcium: 8.4 mg/dL (ref 8.4–10.5)
Glucose, Bld: 127 mg/dL — ABNORMAL HIGH (ref 70–99)
Potassium: 3.5 mEq/L (ref 3.5–5.1)
Sodium: 137 mEq/L (ref 135–145)

## 2012-05-08 LAB — ROCKY MTN SPOTTED FVR AB, IGG-BLOOD: RMSF IgG: 0.95 IV — ABNORMAL HIGH

## 2012-05-08 LAB — CBC
HCT: 45 % (ref 39.0–52.0)
Hemoglobin: 15.8 g/dL (ref 13.0–17.0)
MCH: 29 pg (ref 26.0–34.0)
RBC: 5.44 MIL/uL (ref 4.22–5.81)

## 2012-05-08 MED ORDER — HEPARIN BOLUS VIA INFUSION
3000.0000 [IU] | Freq: Once | INTRAVENOUS | Status: AC
  Administered 2012-05-08: 3000 [IU] via INTRAVENOUS
  Filled 2012-05-08: qty 3000

## 2012-05-08 MED ORDER — HEPARIN (PORCINE) IN NACL 100-0.45 UNIT/ML-% IJ SOLN
1900.0000 [IU]/h | INTRAMUSCULAR | Status: DC
  Administered 2012-05-08 (×3): 1450 [IU]/h via INTRAVENOUS
  Administered 2012-05-09 – 2012-05-11 (×5): 1900 [IU]/h via INTRAVENOUS
  Filled 2012-05-08 (×7): qty 250

## 2012-05-08 MED ORDER — HEPARIN BOLUS VIA INFUSION
2000.0000 [IU] | Freq: Once | INTRAVENOUS | Status: AC
  Administered 2012-05-08: 2000 [IU] via INTRAVENOUS
  Filled 2012-05-08: qty 2000

## 2012-05-08 MED ORDER — WARFARIN VIDEO
Freq: Once | Status: AC
  Administered 2012-05-08: 11:00:00

## 2012-05-08 MED ORDER — COUMADIN BOOK
Freq: Once | Status: AC
  Administered 2012-05-08: 13:00:00
  Filled 2012-05-08 (×2): qty 1

## 2012-05-08 MED ORDER — WARFARIN - PHARMACIST DOSING INPATIENT: Freq: Every day | Status: DC

## 2012-05-08 MED ORDER — WARFARIN SODIUM 5 MG PO TABS
5.0000 mg | ORAL_TABLET | Freq: Once | ORAL | Status: AC
  Administered 2012-05-08: 5 mg via ORAL
  Filled 2012-05-08: qty 1

## 2012-05-08 NOTE — Progress Notes (Signed)
[facility]  Subjective: No Complaints other than not liking air conditioning.  Objective: Vital signs in last 24 hours: Temp:  [97.6 F (36.4 C)-98.2 F (36.8 C)] 97.9 F (36.6 C) (07/20 0919) Pulse Rate:  [80-104] 92  (07/20 0919) Resp:  [17-20] 18  (07/20 0919) BP: (90-131)/(51-112) 124/77 mmHg (07/20 0919) SpO2:  [93 %-97 %] 95 % (07/20 0919) Weight:  [93.7 kg (206 lb 9.1 oz)] 93.7 kg (206 lb 9.1 oz) (07/19 2013) Last BM Date: 05/07/12  Intake/Output from previous day: 07/19 0701 - 07/20 0700 In: 2943 [P.O.:1020; I.V.:1523; IV Piggyback:400] Out: -  Intake/Output this shift: Total I/O In: 480 [P.O.:480] Out: -   Medications Current Facility-Administered Medications  Medication Dose Route Frequency Provider Last Rate Last Dose  . 0.9 %  sodium chloride infusion   Intravenous Continuous Russella Dar, NP 75 mL/hr at 05/08/12 0903    . alum & mag hydroxide-simeth (MAALOX/MYLANTA) 200-200-20 MG/5ML suspension 30 mL  30 mL Oral Q6H PRN Tarry Kos, MD      . ciprofloxacin (CIPRO) IVPB 400 mg  400 mg Intravenous Q24H Tarry Kos, MD   400 mg at 05/06/12 1810  . doxycycline (VIBRA-TABS) tablet 100 mg  100 mg Oral Q12H Tarry Kos, MD   100 mg at 05/07/12 2122  . heparin ADULT infusion 100 units/mL (25000 units/250 mL)  1,200 Units/hr Intravenous Continuous Colleen Can, PHARMD 12 mL/hr at 05/08/12 0854 1,200 Units/hr at 05/08/12 0854  . heparin bolus via infusion 3,000 Units  3,000 Units Intravenous Once Colleen Can, PHARMD   3,000 Units at 05/08/12 0043  . heparin bolus via infusion 4,000 Units  4,000 Units Intravenous Once Calvert Cantor, MD   4,000 Units at 05/07/12 1709  . HYDROcodone-acetaminophen (NORCO/VICODIN) 5-325 MG per tablet 1-2 tablet  1-2 tablet Oral Q4H PRN Tarry Kos, MD      . metroNIDAZOLE (FLAGYL) IVPB 500 mg  500 mg Intravenous Q8H Tarry Kos, MD   500 mg at 05/08/12 0853  . ondansetron (ZOFRAN) tablet 4  mg  4 mg Oral Q6H PRN Tarry Kos, MD       Or  . ondansetron [facility]) injection 4 mg  4 mg Intravenous Q6H PRN Tarry Kos, MD      . potassium chloride SA (K-DUR,KLOR-CON) CR tablet 40 mEq  40 mEq Oral Once Wilburt Finlay, PA   40 mEq at 05/07/12 1721  . Tamsulosin HCl (FLOMAX) capsule 0.4 mg  0.4 mg Oral Once Russella Dar, NP   0.4 mg at 05/07/12 1404  . Tamsulosin HCl (FLOMAX) capsule 0.4 mg  0.4 mg Oral QPC supper Russella Dar, NP      . DISCONTD: Tamsulosin HCl (FLOMAX) capsule 0.4 mg  0.4 mg Oral QPC supper Russella Dar, NP        PE: General appearance: alert, cooperative and no distress Lungs: clear to auscultation bilaterally Heart: irregularly irregular rhythm Extremities: Trace right LEE.  None on the left. Pulses: 2+ and symmetric 1+ DP pulses Skin: Warm and dry Neurologic: Grossly normal  Lab Results:   Basename 05/06/12 0400 05/05/12 1316  WBC 9.9 16.0*  HGB 15.4 17.8*  HCT 44.2 51.5  PLT 109* 144*   BMET  Basename 05/07/12 1155 05/06/12 0400 05/05/12 1316  NA 139 137 132*  K 3.4* 4.6 3.4*  CL 108 105 95*  CO2 19 17* 21  GLUCOSE 103* 113* 152*  BUN 24* 42* 43*  CREATININE 1.18 1.69* 2.55*  CALCIUM 8.1* 7.5* 9.5   PT/INR  Basename 05/07/12 1551  LABPROT 15.3*  INR 1.18      Assessment/Plan   Principal Problem:  *Acute colitis Active Problems:  Atrial fibrillation with controlled ventricular response (NEW)  Diarrhea  Acute renal failure  Dehydration  Fever  Headache  Tick bite  Abdominal pain, right lower quadrant  Dizziness - light-headed  S/P CABG x 4: in 2007 (LIMA to LAD, SVG to diagonal, SVG to ramus intermediate, SVG to distal RCA)  BPH (benign prostatic hyperplasia)  Thrombocytopenia  SIRS (systemic inflammatory response syndrome)  Plan:  TEE/DCCV possibly on Monday.  Afib with controlled rate 80-100. IV heparin.  Started coumadin.  BP controlled.   LOS: 3 days    [name], [name] 05/08/2012 9:22 AM I have seen and  examined the patient along with Wilburt Finlay, PA.  I have reviewed the chart, notes and new data.  I agree with PA's note.  Key new complaints: feels better Key examination changes: remains in AF w controlled VR Key new findings / data: relatively small LA on echo  PLAN: Impossible to say if AF triggered by GI illness or was preexisting and incidentally noted at time of admission.  Hopefully we can schedule TEE CV for Monday Had a long discussion re: AF, certainty of recurrence even if we cardiovert successfully, lifelong need for anticoagulation, choices of anticoagulants, risks and benefits of TEE and CV. He wants to proceed with cardioversion on this admission. He cannot afford newer anticoagulants and wants to take warfarin, with which he had a reasonably stable INR in past.  Thurmon Fair, MD, [facility] [phone] 05/08/2012, 10:23 AM

## 2012-05-08 NOTE — Progress Notes (Signed)
ANTICOAGULATION CONSULT NOTE - Initial Consult  Pharmacy Consult:  Heparin / Coumadin Indication:  Atrial fibrillation  Allergies  Allergen Reactions  . Sulfa Antibiotics Swelling    Patient Measurements: Height: 5\' 6"  (167.6 cm) Weight: 206 lb 9.1 oz (93.7 kg) IBW/kg (Calculated) : 63.8  Heparin Dosing Weight: 74 kg  Vital Signs: Temp: 97.9 F (36.6 C) (07/20 0919) Temp src: Oral (07/20 0919) BP: 124/77 mmHg (07/20 0919) Pulse Rate: 92  (07/20 0919)  Labs:  Basename 05/08/12 0907 05/07/12 2145 05/07/12 1551 05/07/12 1155 05/06/12 0400 05/05/12 1316  HGB 15.8 -- -- -- 15.4 --  HCT 45.0 -- -- -- 44.2 51.5  PLT 134* -- -- -- 109* 144*  APTT -- -- 31 -- -- --  LABPROT -- -- 15.3* -- -- --  INR -- -- 1.18 -- -- --  HEPARINUNFRC <0.10* <0.10* -- -- -- --  CREATININE 1.11 -- -- 1.18 1.69* --  CKTOTAL -- -- -- -- -- 200  CKMB -- -- -- -- -- 1.7  TROPONINI -- -- -- -- -- <0.30    Estimated Creatinine Clearance: 61.6 ml/min (by C-G formula based on Cr of 1.11).   Medical History: Past Medical History  Diagnosis Date  . Hypertension   . Coronary artery disease        Assessment: 72 YOM with new Afib to continue IV heparin and add Coumadin.  Patient's heparin level is subtherapeutic despite rate increases.  No complications with infusion per RN.  No bleeding reported either.  Patient's baseline INR is 1.18.  Noted patient is on Flagyl and Cipro, which could increase the effect of Coumadin.  Patient also has thrombocytopenia with platelets improving to 134 today.   Goal of Therapy:  Heparin level 0.3-0.7 units/ml INR 2 - 3 Monitor platelets by anticoagulation protocol: Yes    Plan:  - Heparin 2000 units IV bolus, then increase gtt to 1450 units/hr - Coumadin 5mg  PO today (decreae dose d/t Flagyl/Cipro) - Check 8 hr HL - Daily HL / CBC / PT / INR - Coumadin book / video     Joana Nolton D. Laney Potash, PharmD, BCPS Pager:  [phone] 05/08/2012, 10:34 AM

## 2012-05-08 NOTE — Progress Notes (Signed)
[facility] Progress Note  05/08/2012 Brief narrative:  77 year old male with a history of coronary artery disease status post CABG who is in otherwise healthy condition who over the last 3 days who has had significant amount of watery diarrhea and no appetite with barely any eating or drinking. He has been having some vague lower abdominal pain. He denies any nausea or vomiting. He says over the last several days he's may hardly any urine. It is taken his wife several days to finally convince him to come to the hospital. He's been experiencing a significant amount of dizziness and progressive worsening weakness when trying to walk over the last 24-48 hours. He's been running fevers, having a significant headache. 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 He denies any rashes. He does not have any kidney issues that he knows of. He denies any chest pain or shortness of breath.  Subjective: Pt wants to advance his diet today.  No complaints.  For TEE CV on Monday.    Objective:  Vital signs in last 24 hours: Filed Vitals:   05/07/12 2013 05/08/12 0540 05/08/12 0919 05/08/12 1320  BP: 102/64 90/51 124/77 146/72  Pulse: 80 92 92 81  Temp: 97.9 F (36.6 C) 98.2 F (36.8 C) 97.9 F (36.6 C) 98.3 F (36.8 C)  TempSrc: Oral Oral Oral Oral  Resp: 18 18 18 18   Height: 5\' 6"  (1.676 m)     Weight: 93.7 kg (206 lb 9.1 oz)     SpO2: 97% 97% 95% 97%   Weight change:   Intake/Output Summary (Last 24 hours) at 05/08/12 1412 Last data filed at 05/08/12 1300  Gross per 24 hour  Intake 3002.95 ml  Output      2 ml  Net 3000.95 ml   No results found for this basename: HGBA1C   Lab Results  Component Value Date   CREATININE 1.11 05/08/2012    Review of Systems As above, otherwise all reviewed and reported negative  Physical Exam General - awake, no distress, cooperative HEENT - NCAT, MMM Lungs - BBS, CTA CV - normal s1, s2 sounds Abd -  soft, nondistended, no masses, nontender Ext - no C/C/E  Lab Results: Results for orders placed during the hospital encounter of 05/05/12 (from the past 24 hour(s))  APTT     Status: Normal   Collection Time   05/07/12  3:51 PM      Component Value Range   aPTT 31  24 - 37 seconds  PROTIME-INR     Status: Abnormal   Collection Time   05/07/12  3:51 PM      Component Value Range   Prothrombin Time 15.3 (*) 11.6 - 15.2 seconds   INR 1.18  0.00 - 1.49  HEPARIN LEVEL (UNFRACTIONATED)     Status: Abnormal   Collection Time   05/07/12  9:45 PM      Component Value Range   Heparin Unfractionated <0.10 (*) 0.30 - 0.70 IU/mL  CBC     Status: Abnormal   Collection Time   05/08/12  9:07 AM      Component Value Range   WBC 8.4  4.0 - 10.5 K/uL   RBC 5.44  4.22 - 5.81 MIL/uL   Hemoglobin 15.8  13.0 - 17.0 g/dL   HCT 65.7  84.6 - 96.2 %   MCV 82.7  78.0 - 100.0 fL   MCH 29.0  26.0 - 34.0 pg  MCHC 35.1  30.0 - 36.0 g/dL   RDW 84.6  96.2 - 95.2 %   Platelets 134 (*) 150 - 400 K/uL  BASIC METABOLIC PANEL     Status: Abnormal   Collection Time   05/08/12  9:07 AM      Component Value Range   Sodium 137  135 - 145 mEq/L   Potassium 3.5  3.5 - 5.1 mEq/L   Chloride 108  96 - 112 mEq/L   CO2 18 (*) 19 - 32 mEq/L   Glucose, Bld 127 (*) 70 - 99 mg/dL   BUN 19  6 - 23 mg/dL   Creatinine, Ser 8.41  0.50 - 1.35 mg/dL   Calcium 8.4  8.4 - 32.4 mg/dL   GFR calc non Af Amer 63 (*) >90 mL/min   GFR calc Af Amer 73 (*) >90 mL/min  HEPARIN LEVEL (UNFRACTIONATED)     Status: Abnormal   Collection Time   05/08/12  9:07 AM      Component Value Range   Heparin Unfractionated <0.10 (*) 0.30 - 0.70 IU/mL    Micro Results: Recent Results (from the past 240 hour(s))  CLOSTRIDIUM DIFFICILE BY PCR     Status: Normal   Collection Time   05/05/12  3:15 PM      Component Value Range Status Comment   C difficile by pcr NEGATIVE  NEGATIVE Final   STOOL CULTURE     Status: Normal (Preliminary result)    Collection Time   05/05/12  3:15 PM      Component Value Range Status Comment   Specimen Description STOOL   Final    Special Requests NONE   Final    Culture Culture reincubated for better growth   Final    Report Status PENDING   Incomplete   MRSA PCR SCREENING     Status: Normal   Collection Time   05/06/12 12:31 AM      Component Value Range Status Comment   MRSA by PCR NEGATIVE  NEGATIVE Final     Medications:  Scheduled Meds:   . ciprofloxacin  400 mg Intravenous Q24H  . coumadin book   Does not apply Once  . doxycycline  100 mg Oral Q12H  . heparin  2,000 Units Intravenous Once  . heparin  3,000 Units Intravenous Once  . heparin  4,000 Units Intravenous Once  . metronidazole  500 mg Intravenous Q8H  . potassium chloride  40 mEq Oral Once  . Tamsulosin HCl  0.4 mg Oral QPC supper  . warfarin  5 mg Oral ONCE-1800  . warfarin   Does not apply Once  . Warfarin - Pharmacist Dosing Inpatient   Does not apply q1800   Continuous Infusions:   . sodium chloride 75 mL/hr at 05/08/12 0903  . heparin 1,450 Units/hr (05/08/12 1101)  . DISCONTD: heparin 1,200 Units/hr (05/08/12 0854)   PRN Meds:.alum & mag hydroxide-simeth, HYDROcodone-acetaminophen, ondansetron (ZOFRAN) IV, ondansetron  Assessment/Plan: *Acute colitis/Diarrhea  *C. difficile PCR negative  *Stool culture pending  *Diarrhea has slowed down so will attempt to transition from clear liquids to full liquids but continue IV fluids at a lower rate  *CT scan this admission shows bowel wall thickening of the descending colon, cecum and terminal ileum compatible with enterocolitis differential also includes possible inflammatory bowel disease/Crohn's disease versus infection is less likely ischemia  *Patient's symptoms are acute in nature and diarrhea is nonbloody so doubt inflammatory bowel disease or ischemia  *Could be a case  of severe viral gastroenteritis we'll continue to monitor and provide supportive care   Acute  renal failure/Dehydration/SIRS  *Baseline creatinine unknown but current creatinine has decreased to 1.18 and BUN has decreased from 42 to 24 so appears near euvolemic at this  *Continue to follow electrolyte panel and continue supportive care  *Was on lisinopril prior to admission and this certainly contributed to progressive renal dysfunction in the setting of dehydration   Abdominal pain, right lower quadrant  *Consistant with enterocolitis findings on CT scan  * patient endorses abdominal pain which is now improving   Atrial fibrillation with controlled ventricular response (NEW)  * no prior history of atrial fibrillation currently rate controlled  * [place] score of 2  *Suspect related to dehydration and acute illness to have a low threshold for initiating Coumadin at this point  *Echocardiogram pending  *SEHV consulted *TSH is normal  Pt for TEE CV on Monday  Continue warfarin per pharmacy  Fever/Headache/Tick bite  *Multiple tick bites without evidence of wound abscess  *No other rashes consistent with Lyme disease or Rocky none spotted fever  *Empiric treatment as a precaution  *RMSF titer has been obtained but usually would not demonstrate positive status until 10-14 days after tick bite and symptoms   Thrombocytopenia  *Platelets 144,000 at presentation and had decreased to 109,000-repeat CBC in the morning  *Likely thrombocytopenia indicative of degree of dehydration and inflammatory response at presentation but could be indicative of a gram-negative infection  *Urinalysis was not consistent with UTI- only ATN   S/P CABG x 4  *No ischemic changes on EKG but history of coronary disease another risk factor regarding his development of atrial fibrillation   BPH (benign prostatic hyperplasia)  *On Flomax  Code Status: Full  Family Communication: Patient's wife and other family members in room during exam and rales this morning -full update given on patient's current status on  05/06/2012  Disposition Plan: Transfer to telemetry   LOS: 3 days   Clanford Johnson 05/08/2012, 2:12 PM  Cleora Fleet, MD, CDE, FAAFP [facility] [place], [place]  [phone]

## 2012-05-08 NOTE — Progress Notes (Signed)
ANTICOAGULATION CONSULT NOTE - Follow Up Consult  Pharmacy Consult for heparin Indication: atrial fibrillation  Labs:  Basename 05/07/12 2145 05/07/12 1551 05/07/12 1155 05/06/12 0400 05/05/12 1316  HGB -- -- -- 15.4 17.8*  HCT -- -- -- 44.2 51.5  PLT -- -- -- 109* 144*  APTT -- 31 -- -- --  LABPROT -- 15.3* -- -- --  INR -- 1.18 -- -- --  HEPARINUNFRC <0.10* -- -- -- --  CREATININE -- -- 1.18 1.69* 2.55*  CKTOTAL -- -- -- -- 200  CKMB -- -- -- -- 1.7  TROPONINI -- -- -- -- <0.30    Assessment: 77yo male undetectable on heparin with initial dosing for Afib though level was drawn just five hours after bolus administered.  Goal of Therapy:  Heparin level 0.3-0.7 units/ml   Plan:  Will give additional heparin bolus of 3000 units and increase gtt by 4 units/kg/hr to 1200 units/hr and check level in 8hr.  Colleen Can PharmD BCPS 05/08/2012,12:35 AM

## 2012-05-08 NOTE — Progress Notes (Signed)
Anticoagulation consult  Pharmacy Consult for Heparin Indication: atrial fibrillation   Allergies  Allergen Reactions  . Sulfa Antibiotics Swelling    Patient Measurements: Height: 5\' 6"  (167.6 cm) Weight: 206 lb 9.1 oz (93.7 kg) IBW/kg (Calculated) : 63.8    Vital Signs: Temp: 97.9 F (36.6 C) (07/20 1609) Temp src: Oral (07/20 1609) BP: 117/67 mmHg (07/20 1609) Pulse Rate: 81  (07/20 1609) Intake/Output from previous day: 07/19 0701 - 07/20 0700 In: 2943 [P.O.:1020; I.V.:1523; IV Piggyback:400] Out: -  Intake/Output from this shift:    Labs:  Basename 05/08/12 0907 05/07/12 1155 05/06/12 0400  WBC 8.4 -- 9.9  HGB 15.8 -- 15.4  PLT 134* -- 109*  LABCREA -- -- --  CREATININE 1.11 1.18 1.69*   Estimated Creatinine Clearance: 61.6 ml/min (by C-G formula based on Cr of 1.11). No results found for this basename: VANCOTROUGH:2,VANCOPEAK:2,VANCORANDOM:2,GENTTROUGH:2,GENTPEAK:2,GENTRANDOM:2,TOBRATROUGH:2,TOBRAPEAK:2,TOBRARND:2,AMIKACINPEAK:2,AMIKACINTROU:2,AMIKACIN:2, in the last 72 hours   Microbiology: Recent Results (from the past 720 hour(s))  CLOSTRIDIUM DIFFICILE BY PCR     Status: Normal   Collection Time   05/05/12  3:15 PM      Component Value Range Status Comment   C difficile by pcr NEGATIVE  NEGATIVE Final   STOOL CULTURE     Status: Normal (Preliminary result)   Collection Time   05/05/12  3:15 PM      Component Value Range Status Comment   Specimen Description STOOL   Final    Special Requests NONE   Final    Culture NO SUSPICIOUS COLONIES, CONTINUING TO HOLD   Final    Report Status PENDING   Incomplete   MRSA PCR SCREENING     Status: Normal   Collection Time   05/06/12 12:31 AM      Component Value Range Status Comment   MRSA by PCR NEGATIVE  NEGATIVE Final     Anti-infectives     Start     Dose/Rate Route Frequency Ordered Stop   05/06/12 1800   ciprofloxacin (CIPRO) IVPB 400 mg        400 mg 200 mL/hr over 60 Minutes Intravenous Every 24  hours 05/06/12 0012     05/06/12 0400   metroNIDAZOLE (FLAGYL) IVPB 500 mg        500 mg 100 mL/hr over 60 Minutes Intravenous 3 times per day 05/06/12 0012     05/06/12 0045   doxycycline (VIBRA-TABS) tablet 100 mg        100 mg Oral Every 12 hours 05/06/12 0012     05/05/12 1715   ciprofloxacin (CIPRO) IVPB 400 mg        400 mg 200 mL/hr over 60 Minutes Intravenous  Once 05/05/12 1703 05/05/12 1933   05/05/12 1715   metroNIDAZOLE (FLAGYL) IVPB 500 mg        500 mg 100 mL/hr over 60 Minutes Intravenous  Once 05/05/12 1703 05/05/12 2034          Assessment: 77yo male  on heparin  for Afib heparin level =0.31 on 1450 units/hr Goal of Therapy:  Heparin level 0.3-0.7  Plan:  increase heparin rate to  1650 units /hr 6 hr heparin level  Lucille Passy 05/08/2012,9:18 PM

## 2012-05-09 DIAGNOSIS — R42 Dizziness and giddiness: Secondary | ICD-10-CM

## 2012-05-09 LAB — COMPREHENSIVE METABOLIC PANEL
ALT: 20 U/L (ref 0–53)
AST: 29 U/L (ref 0–37)
Alkaline Phosphatase: 44 U/L (ref 39–117)
CO2: 19 mEq/L (ref 19–32)
Chloride: 108 mEq/L (ref 96–112)
GFR calc non Af Amer: 58 mL/min — ABNORMAL LOW (ref >90)
Sodium: 140 mEq/L (ref 135–145)
Total Bilirubin: 0.4 mg/dL (ref 0.3–1.2)

## 2012-05-09 LAB — STOOL CULTURE

## 2012-05-09 LAB — CBC
HCT: 41.7 % (ref 39.0–52.0)
Hemoglobin: 14.8 g/dL (ref 13.0–17.0)
WBC: 9.7 10*3/uL (ref 4.0–10.5)

## 2012-05-09 LAB — PROTIME-INR: INR: 1.34 (ref 0.00–1.49)

## 2012-05-09 MED ORDER — HEPARIN BOLUS VIA INFUSION
3000.0000 [IU] | Freq: Once | INTRAVENOUS | Status: AC
  Administered 2012-05-09: 3000 [IU] via INTRAVENOUS
  Filled 2012-05-09: qty 3000

## 2012-05-09 MED ORDER — MOXIFLOXACIN HCL 400 MG PO TABS
400.0000 mg | ORAL_TABLET | Freq: Every day | ORAL | Status: DC
  Administered 2012-05-09 – 2012-05-10 (×2): 400 mg via ORAL
  Filled 2012-05-09 (×4): qty 1

## 2012-05-09 NOTE — Progress Notes (Signed)
[facility] Progress Note  05/09/2012 Brief narrative:  77 year old male with a history of coronary artery disease status post CABG who is in otherwise healthy condition who over the last 3 days who has had significant amount of watery diarrhea and no appetite with barely any eating or drinking. He has been having some vague lower abdominal pain. He denies any nausea or vomiting. He says over the last several days he's made hardly any urine. It has taken his wife several days to finally convince him to come to the hospital. He's been experiencing a significant amount of dizziness and progressive worsening weakness when trying to walk over the last 24-48 hours. He's been running fevers, having a significant headache. 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 He denies any rashes. He does not have any kidney issues that he knows of. He denies any chest pain or shortness of breath. He was found to be in acute renal failure and afib.   Subjective: Pt says he is feeling better and tolerating diet.  He says he had some black stool this morning.    Objective:  Vital signs in last 24 hours: Filed Vitals:   05/08/12 2200 05/09/12 0453 05/09/12 0856 05/09/12 1300  BP: 134/79 101/61 128/80 143/71  Pulse: 88 79 77 81  Temp: 98 F (36.7 C) 98.5 F (36.9 C) 98.1 F (36.7 C) 98 F (36.7 C)  TempSrc: Oral Oral Oral Oral  Resp: 18 18 18 18   Height:      Weight: 91.3 kg (201 lb 4.5 oz)     SpO2: 97% 97% 95% 97%   Weight change: -2.4 kg (-5 lb 4.7 oz)  Intake/Output Summary (Last 24 hours) at 05/09/12 1338 Last data filed at 05/09/12 1300  Gross per 24 hour  Intake 2536.21 ml  Output      3 ml  Net 2533.21 ml   No results found for this basename: HGBA1C   Lab Results  Component Value Date   CREATININE 1.19 05/09/2012    Review of Systems As above, otherwise all reviewed and reported negative  Physical Exam General - awake, no distress,  cooperative HEENT - NCAT, MMM Lungs - BBS, CTA CV - normal s1, s2 sounds Abd - soft, nondistended, no masses, nontender Ext - no C/C/E  Lab Results: Results for orders placed during the hospital encounter of 05/05/12 (from the past 24 hour(s))  HEPARIN LEVEL (UNFRACTIONATED)     Status: Normal   Collection Time   05/08/12  8:09 PM      Component Value Range   Heparin Unfractionated 0.31  0.30 - 0.70 IU/mL  CBC     Status: Normal   Collection Time   05/09/12  5:20 AM      Component Value Range   WBC 9.7  4.0 - 10.5 K/uL   RBC 5.06  4.22 - 5.81 MIL/uL   Hemoglobin 14.8  13.0 - 17.0 g/dL   HCT 16.1  09.6 - 04.5 %   MCV 82.4  78.0 - 100.0 fL   MCH 29.2  26.0 - 34.0 pg   MCHC 35.5  30.0 - 36.0 g/dL   RDW 40.9  81.1 - 91.4 %   Platelets 151  150 - 400 K/uL  PROTIME-INR     Status: Abnormal   Collection Time   05/09/12  5:20 AM      Component Value Range   Prothrombin Time 16.8 (*) 11.6 - 15.2  seconds   INR 1.34  0.00 - 1.49  COMPREHENSIVE METABOLIC PANEL     Status: Abnormal   Collection Time   05/09/12  5:20 AM      Component Value Range   Sodium 140  135 - 145 mEq/L   Potassium 3.5  3.5 - 5.1 mEq/L   Chloride 108  96 - 112 mEq/L   CO2 19  19 - 32 mEq/L   Glucose, Bld 91  70 - 99 mg/dL   BUN 16  6 - 23 mg/dL   Creatinine, Ser 1.19  0.50 - 1.35 mg/dL   Calcium 8.1 (*) 8.4 - 10.5 mg/dL   Total Protein 5.4 (*) 6.0 - 8.3 g/dL   Albumin 2.9 (*) 3.5 - 5.2 g/dL   AST 29  0 - 37 U/L   ALT 20  0 - 53 U/L   Alkaline Phosphatase 44  39 - 117 U/L   Total Bilirubin 0.4  0.3 - 1.2 mg/dL   GFR calc non Af Amer 58 (*) >90 mL/min   GFR calc Af Amer 67 (*) >90 mL/min  HEPARIN LEVEL (UNFRACTIONATED)     Status: Abnormal   Collection Time   05/09/12  5:20 AM      Component Value Range   Heparin Unfractionated 0.10 (*) 0.30 - 0.70 IU/mL    Micro Results: Recent Results (from the past 240 hour(s))  CLOSTRIDIUM DIFFICILE BY PCR     Status: Normal   Collection Time   05/05/12  3:15 PM       Component Value Range Status Comment   C difficile by pcr NEGATIVE  NEGATIVE Final   STOOL CULTURE     Status: Normal (Preliminary result)   Collection Time   05/05/12  3:15 PM      Component Value Range Status Comment   Specimen Description STOOL   Final    Special Requests NONE   Final    Culture NO SUSPICIOUS COLONIES, CONTINUING TO HOLD   Final    Report Status PENDING   Incomplete   MRSA PCR SCREENING     Status: Normal   Collection Time   05/06/12 12:31 AM      Component Value Range Status Comment   MRSA by PCR NEGATIVE  NEGATIVE Final     Medications:  Scheduled Meds:   . ciprofloxacin  400 mg Intravenous Q24H  . doxycycline  100 mg Oral Q12H  . heparin  3,000 Units Intravenous Once  . metronidazole  500 mg Intravenous Q8H  . Tamsulosin HCl  0.4 mg Oral QPC supper  . warfarin  5 mg Oral ONCE-1800  . Warfarin - Pharmacist Dosing Inpatient   Does not apply q1800   Continuous Infusions:   . sodium chloride 75 mL/hr at 05/09/12 1101  . heparin 1,900 Units/hr (05/09/12 1206)   PRN Meds:.alum & mag hydroxide-simeth, HYDROcodone-acetaminophen, ondansetron (ZOFRAN) IV, ondansetron  Assessment/Plan: *Acute colitis/Diarrhea  *C. difficile PCR negative  *Stool culture negative to date *Diarrhea has resolved now, pt tolerated reg heart diet now *CT scan this admission shows bowel wall thickening of the descending colon, cecum and terminal ileum compatible with enterocolitis differential also includes possible inflammatory bowel disease/Crohn's disease versus infection is less likely ischemia  *Patient's symptoms are acute in nature and diarrhea is nonbloody so doubt inflammatory bowel disease or ischemia  *Could be a case of severe viral gastroenteritis will continue to monitor and provide supportive care   Acute renal failure/Dehydration/SIRS  *Much improved now.  Baseline  creatinine unknown but current creatinine has decreased to 1.18 and BUN has decreased from 42 to 24 so  appears near euvolemic at this  *Continue to follow electrolyte panel and continue supportive care  *Was on lisinopril prior to admission and this certainly contributed to progressive renal dysfunction in the setting of dehydration   Abdominal pain, right lower quadrant  *Consistant with enterocolitis findings on CT scan  * patient endorses abdominal pain which is now improving  - with reported black stools, will ask for next stools to be hemocculted  Atrial fibrillation with controlled ventricular response (NEW)  * no prior history of atrial fibrillation currently rate controlled  * [place] score of 2  *Suspect related to dehydration and acute illness to have a low threshold for initiating Coumadin at this point  *Echocardiogram pending  *SEHV consulted  *TSH is normal  Pt for TEE CV on Monday at noon Continue warfarin per pharmacy   Fever/Headache/Tick bite  *Multiple tick bites without evidence of wound abscess  *No other rashes consistent with Lyme disease or Rocky none spotted fever  *Empiric treatment as a precaution  *RMSF titer has been obtained but usually would not demonstrate positive status until 10-14 days after tick bite and symptoms   Thrombocytopenia  *Platelets 144,000 at presentation and now holding stable, now 151 *Likely thrombocytopenia indicative of degree of dehydration and inflammatory response at presentation but could be indicative of a gram-negative infection  *Urinalysis was not consistent with UTI- only ATN   S/P CABG x 4  *No ischemic changes on EKG but history of coronary disease another risk factor regarding his development of atrial fibrillation   BPH (benign prostatic hyperplasia)  *On Flomax  Urine flow and output improving  Code Status: Full  Family Communication: Patient's wife updated at bedside today  Disposition Plan: Home when cleared by cardiology   LOS: 4 days   Clanford Johnson 05/09/2012, 1:38 PM  Cleora Fleet, MD, CDE,  FAAFP [facility] [place], [place]  [phone]

## 2012-05-09 NOTE — Progress Notes (Signed)
[facility]  Subjective: Abdominal was a little sore.  Objective: Vital signs in last 24 hours: Temp:  [97.9 F (36.6 C)-98.5 F (36.9 C)] 98.1 F (36.7 C) (07/21 0856) Pulse Rate:  [77-92] 77  (07/21 0856) Resp:  [18] 18  (07/21 0856) BP: (101-146)/(61-80) 128/80 mmHg (07/21 0856) SpO2:  [95 %-98 %] 95 % (07/21 0856) Weight:  [91.3 kg (201 lb 4.5 oz)] 91.3 kg (201 lb 4.5 oz) (07/20 2200) Last BM Date: 05/08/12  Intake/Output from previous day: 07/20 0701 - 07/21 0700 In: 2896.2 [P.O.:1780; I.V.:616.2; IV Piggyback:500] Out: 5 [Urine:4; Stool:1] Intake/Output this shift:    Medications Current Facility-Administered Medications  Medication Dose Route Frequency Provider Last Rate Last Dose  . 0.9 %  sodium chloride infusion   Intravenous Continuous Russella Dar, NP 75 mL/hr at 05/08/12 0903    . alum & mag hydroxide-simeth (MAALOX/MYLANTA) 200-200-20 MG/5ML suspension 30 mL  30 mL Oral Q6H PRN Tarry Kos, MD      . ciprofloxacin (CIPRO) IVPB 400 mg  400 mg Intravenous Q24H Tarry Kos, MD   400 mg at 05/08/12 1730  . coumadin book   Does not apply Once Thuy Dien Dang, [place]      . doxycycline (VIBRA-TABS) tablet 100 mg  100 mg Oral Q12H Tarry Kos, MD   100 mg at 05/08/12 2154  . heparin ADULT infusion 100 units/mL (25000 units/250 mL)  1,900 Units/hr Intravenous Continuous Colleen Can, PHARMD 19 mL/hr at 05/09/12 0817 1,900 Units/hr at 05/09/12 0817  . heparin bolus via infusion 2,000 Units  2,000 Units Intravenous Once Thuy Dien Dang, PHARMD   2,000 Units at 05/08/12 1045  . heparin bolus via infusion 3,000 Units  3,000 Units Intravenous Once Colleen Can, PHARMD   3,000 Units at 05/09/12 0818  . HYDROcodone-acetaminophen (NORCO/VICODIN) 5-325 MG per tablet 1-2 tablet  1-2 tablet Oral Q4H PRN Tarry Kos, MD      . metroNIDAZOLE (FLAGYL) IVPB 500 mg  500 mg Intravenous Q8H Tarry Kos, MD   500 mg at 05/09/12 1478  .  ondansetron (ZOFRAN) tablet 4 mg  4 mg Oral Q6H PRN Tarry Kos, MD       Or  . ondansetron [facility]) injection 4 mg  4 mg Intravenous Q6H PRN Tarry Kos, MD      . Tamsulosin HCl (FLOMAX) capsule 0.4 mg  0.4 mg Oral QPC supper Russella Dar, NP   0.4 mg at 05/08/12 1731  . warfarin (COUMADIN) tablet 5 mg  5 mg Oral ONCE-1800 Thuy Dien Dang, PHARMD   5 mg at 05/08/12 1731  . warfarin (COUMADIN) video   Does not apply Once Thuy Dien Dang, [facility]      . Warfarin - Pharmacist Dosing Inpatient   Does not apply q1800 [facility] [address], PHARMD      . DISCONTD: heparin ADULT infusion 100 units/mL (25000 units/250 mL)  1,200 Units/hr Intravenous Continuous Colleen Can, PHARMD 12 mL/hr at 05/08/12 0854 1,200 Units/hr at 05/08/12 0854    PE: General appearance: alert, cooperative and no distress Lungs: clear to auscultation bilaterally Heart: irregularly irregular rhythm Abdomen: +bs Mild tenderness in the LLQ. Extremities: 1+ right LEE Pulses: Radials 2+ and symmetric,1+ DPs Skin: Warm and Dry Neurologic: Grossly normal  Lab Results:   Basename 05/09/12 0520 05/08/12 0907  WBC 9.7 8.4  HGB 14.8 15.8  HCT 41.7 45.0  PLT 151 134*   BMET  Basename 05/09/12 0520 05/08/12 0907 05/07/12  1155  NA 140 137 139  K 3.5 3.5 3.4*  CL 108 108 108  CO2 19 18* 19  GLUCOSE 91 127* 103*  BUN 16 19 24*  CREATININE 1.19 1.11 1.18  CALCIUM 8.1* 8.4 8.1*   PT/INR  Basename 05/09/12 0520 05/07/12 1551  LABPROT 16.8* 15.3*  INR 1.34 1.18      Assessment/Plan  Principal Problem:  *Acute colitis Active Problems:  Atrial fibrillation with controlled ventricular response (NEW)  Diarrhea  Acute renal failure  Dehydration  Fever  Headache  Tick bite  Abdominal pain, right lower quadrant  Dizziness - light-headed  S/P CABG x 4: in 2007 (LIMA to LAD, SVG to diagonal, SVG to ramus intermediate, SVG to distal RCA)  BPH (benign prostatic hyperplasia)  Thrombocytopenia  SIRS (systemic  inflammatory response syndrome)  Plan:  TEE/DCCV possibly on Monday. Afib with controlled rate 80-100. IV heparin. Started coumadin. BP controlled.   LOS: 4 days    [name], [name] 05/09/2012 9:04 AM  I have seen and examined the patient along with [name], [name], PA.  I have reviewed the chart, notes and new data.  I agree with PA's note.  Key new complaints: abdominal discomfort likely related to GI process that led to admission Key examination changes: remains in AF w controlled rate Key new findings / data: labs show normal lytes and mild increase in INR to 1.34  PLAN: TEE and cardioversion tomorrow. This procedure has been fully reviewed with the patient and written informed consent has been obtained.  Kenneth Fair, MD, [facility] [phone] 05/09/2012, 9:17 AM

## 2012-05-09 NOTE — Progress Notes (Signed)
ANTICOAGULATION CONSULT NOTE - Follow Up Consult  Pharmacy Consult for heparin Indication: atrial fibrillation  Labs:  Basename 05/09/12 0520 05/08/12 2009 05/08/12 0907 05/07/12 1551 05/07/12 1155  HGB 14.8 -- 15.8 -- --  HCT 41.7 -- 45.0 -- --  PLT 151 -- 134* -- --  APTT -- -- -- 31 --  LABPROT 16.8* -- -- 15.3* --  INR 1.34 -- -- 1.18 --  HEPARINUNFRC 0.10* 0.31 <0.10* -- --  CREATININE -- -- 1.11 -- 1.18  CKTOTAL -- -- -- -- --  CKMB -- -- -- -- --  TROPONINI -- -- -- -- --    Assessment: 77yo male now subtherapeutic on heparin after rate increase for level on low end of goal; no infusion issues overnight per RN.  Goal of Therapy:  Heparin level 0.3-0.7 units/ml   Plan:  Will give another heparin bolus of 3000 units x1 and increase heparin gtt to 1900 units/hr and check level in 8hr.  Colleen Can PharmD BCPS 05/09/2012,7:41 AM

## 2012-05-10 ENCOUNTER — Encounter (HOSPITAL_COMMUNITY): Payer: Self-pay | Admitting: Anesthesiology

## 2012-05-10 ENCOUNTER — Encounter (HOSPITAL_COMMUNITY)
Admission: EM | Disposition: A | Discharge status: Home / Self Care | Payer: Self-pay | Source: Home / Self Care | Attending: Family Medicine

## 2012-05-10 ENCOUNTER — Inpatient Hospital Stay (HOSPITAL_COMMUNITY): Payer: Medicare Other | Admitting: Anesthesiology

## 2012-05-10 ENCOUNTER — Encounter (HOSPITAL_COMMUNITY): Payer: Self-pay | Admitting: *Deleted

## 2012-05-10 DIAGNOSIS — T148XXA Other injury of unspecified body region, initial encounter: Secondary | ICD-10-CM

## 2012-05-10 DIAGNOSIS — W57XXXA Bitten or stung by nonvenomous insect and other nonvenomous arthropods, initial encounter: Secondary | ICD-10-CM

## 2012-05-10 DIAGNOSIS — D696 Thrombocytopenia, unspecified: Secondary | ICD-10-CM

## 2012-05-10 HISTORY — PX: CARDIOVERSION: SHX1299

## 2012-05-10 HISTORY — PX: TEE WITHOUT CARDIOVERSION: SHX5443

## 2012-05-10 LAB — COMPREHENSIVE METABOLIC PANEL
ALT: 25 U/L (ref 0–53)
Alkaline Phosphatase: 41 U/L (ref 39–117)
CO2: 24 mEq/L (ref 19–32)
GFR calc Af Amer: 66 mL/min — ABNORMAL LOW (ref >90)
GFR calc non Af Amer: 57 mL/min — ABNORMAL LOW (ref >90)
Glucose, Bld: 97 mg/dL (ref 70–99)
Potassium: 3.8 mEq/L (ref 3.5–5.1)
Sodium: 140 mEq/L (ref 135–145)
Total Bilirubin: 0.3 mg/dL (ref 0.3–1.2)

## 2012-05-10 LAB — CBC
HCT: 39.7 % (ref 39.0–52.0)
MCHC: 35.5 g/dL (ref 30.0–36.0)
MCV: 82.7 fL (ref 78.0–100.0)
RDW: 14.6 % (ref 11.5–15.5)
WBC: 9.8 10*3/uL (ref 4.0–10.5)

## 2012-05-10 SURGERY — ECHOCARDIOGRAM, TRANSESOPHAGEAL
Anesthesia: Monitor Anesthesia Care

## 2012-05-10 MED ORDER — SODIUM CHLORIDE 0.45 % IV SOLN
INTRAVENOUS | Status: DC
  Administered 2012-05-10: 500 mL via INTRAVENOUS

## 2012-05-10 MED ORDER — PROPOFOL 10 MG/ML IV EMUL
INTRAVENOUS | Status: DC | PRN
  Administered 2012-05-10: 100 mg via INTRAVENOUS

## 2012-05-10 MED ORDER — MIDAZOLAM HCL 10 MG/2ML IJ SOLN
INTRAMUSCULAR | Status: DC | PRN
  Administered 2012-05-10 (×3): 1 mg via INTRAVENOUS

## 2012-05-10 MED ORDER — MIDAZOLAM HCL 10 MG/2ML IJ SOLN
INTRAMUSCULAR | Status: AC
  Filled 2012-05-10: qty 2

## 2012-05-10 MED ORDER — LIDOCAINE VISCOUS 2 % MT SOLN
OROMUCOSAL | Status: DC | PRN
  Administered 2012-05-10: 10 mL via OROMUCOSAL

## 2012-05-10 MED ORDER — LIDOCAINE HCL (CARDIAC) 20 MG/ML IV SOLN
INTRAVENOUS | Status: DC | PRN
  Administered 2012-05-10: 60 mg via INTRAVENOUS

## 2012-05-10 MED ORDER — FENTANYL CITRATE 0.05 MG/ML IJ SOLN
INTRAMUSCULAR | Status: DC | PRN
  Administered 2012-05-10 (×2): 25 ug via INTRAVENOUS

## 2012-05-10 MED ORDER — FENTANYL CITRATE 0.05 MG/ML IJ SOLN
INTRAMUSCULAR | Status: AC
  Filled 2012-05-10: qty 2

## 2012-05-10 MED ORDER — LIDOCAINE VISCOUS 2 % MT SOLN
OROMUCOSAL | Status: AC
  Filled 2012-05-10: qty 15

## 2012-05-10 MED ORDER — WARFARIN SODIUM 5 MG PO TABS
5.0000 mg | ORAL_TABLET | Freq: Once | ORAL | Status: AC
  Administered 2012-05-10: 5 mg via ORAL
  Filled 2012-05-10: qty 1

## 2012-05-10 MED FILL — Perflutren Lipid Microsphere IV Susp 6.52 MG/ML: INTRAVENOUS | Qty: 2 | Status: AC

## 2012-05-10 NOTE — CV Procedure (Signed)
[facility]  TEE/CARDIOVERSION NOTE   TRANSESOPHAGEAL ECHOCARDIOGRAM (TEE):  Indictation: Atrial Fibrillation  Consent:   Informed consent was obtained prior to the procedure. The risks, benefits and alternatives for the procedure were discussed and the patient comprehended these risks.  Risks include, but are not limited to, cough, sore throat, vomiting, nausea, somnolence, esophageal and stomach trauma or perforation, bleeding, low blood pressure, aspiration, pneumonia, infection, trauma to the teeth and death.    Time Out: Verified patient identification, verified procedure, site/side was marked, verified correct patient position, special equipment/implants available, medications/allergies/relevent history reviewed, required imaging and test results available. Performed  Procedure:  After a procedural time-out, the patient was given 2 mg versed and 50 mcg fentanyl for moderate sedation.  The oropharynx was anesthetized 10 cc of topical 1% viscous lidocaine.  The transesophageal probe was inserted in the esophagus and stomach without difficulty and multiple views were obtained.  The patient was kept under observation until the patient left the procedure room.  The patient left the procedure room in stable condition.   Agitated microbubble saline contrast was not administered.  Complications:    Complications: None Patient did tolerate procedure well.  Findings:  1. LEFT VENTRICLE: The left ventricle is normal in structure and function without any thrombus or masses. LVEF is 55-60%.  2. RIGHT VENTRICLE:  The right ventricle is normal in structure and function without any thrombus or masses.    3. LEFT ATRIUM:  The left atrium is normal without any thrombus or masses.  4. LEFT ATRIAL APPENDAGE:  The left atrial appendage is free of any thrombus or masses. Left atrial size is dilated.  5. ATRIAL SEPTUM:  The atrial septum is free of any thrombus or  masses.  There is no evidence for interatrial shunting by color doppler.  6. RIGHT ATRIUM:  The right atrium is normal in size and function without any thrombus or masses.  7. MITRAL VALVE:  The mitral valve is normal in structure and function with mild central regurgitation.  There were no vegetations or stenosis.  8. AORTIC VALVE:  The aortic valve is normal in structure and function without regurgitation.  There were no vegetations or stenosis   9. TRICUSPID VALVE:  The tricuspid valve is normal in structure and function with mild regurgitation.  There were no vegetations or stenosis  10.  PULMONIC VALVE:  The tricuspid valve is normal in structure and function with trace regurgitation.  There were no vegetations or stenosis.   11. AORTIC ARCH, ASCENDING AND DESCENDING AORTA:  There was mild atherosclerosis of the aortic arch  CARDIOVERSION:     Time Out: Verified patient identification, verified procedure, site/side was marked, verified correct patient position, special equipment/implants available, medications/allergies/relevent history reviewed, required imaging and test results available.  Performed  Procedure:  1. Patient placed on cardiac monitor, pulse oximetry, supplemental oxygen as necessary.  2. Sedation administered per anesthesia 3. Pacer pads placed anterior and posterior chest. 4. Cardioverted 1 time(s).  5. Cardioverted at 150J biphasic.  Complications:  Complications: None Patient did tolerate procedure well.  Impression:  1.  No evidence for LAA thrombus. 2. Normal LVEF of 55-60%. 3. Successful DCCV to normal sinus rhythm after a single 150J biphasic shock.  Recommendations:  1.  Anticoagulation with warfarin and possible discharge home tomorrow per primary service.  Time Spent Directly with the Patient:  60 minutes   Chrystie Nose, MD, [facility] Attending Cardiologist [facility]  05/10/2012, 3:22 PM

## 2012-05-10 NOTE — Progress Notes (Signed)
Pt. Seen and examined. Agree with the NP/PA-C note as written.  Plan TEE today. See my procedure note.  Chrystie Nose, MD, [facility] Attending Cardiologist [facility]

## 2012-05-10 NOTE — Anesthesia Preprocedure Evaluation (Signed)
Anesthesia Evaluation  Patient identified by MRN, date of birth, ID band Patient awake    Reviewed: Allergy & Precautions, H&P , NPO status , Patient's Chart, lab work & pertinent test results  Airway Mallampati: II  Neck ROM: full    Dental   Pulmonary          Cardiovascular hypertension, + CAD + dysrhythmias Atrial Fibrillation     Neuro/Psych  Headaches,    GI/Hepatic   Endo/Other    Renal/GU      Musculoskeletal   Abdominal   Peds  Hematology   Anesthesia Other Findings   Reproductive/Obstetrics                           Anesthesia Physical Anesthesia Plan  ASA: III  Anesthesia Plan: MAC   Post-op Pain Management:    Induction: Intravenous  Airway Management Planned: Mask  Additional Equipment:   Intra-op Plan:   Post-operative Plan:   Informed Consent: I have reviewed the patients History and Physical, chart, labs and discussed the procedure including the risks, benefits and alternatives for the proposed anesthesia with the patient or authorized representative who has indicated his/her understanding and acceptance.     Plan Discussed with: CRNA  Anesthesia Plan Comments:         Anesthesia Quick Evaluation

## 2012-05-10 NOTE — Preoperative (Signed)
Beta Blockers   Reason not to administer Beta Blockers:Not Applicable 

## 2012-05-10 NOTE — Progress Notes (Signed)
ANTICOAGULATION CONSULT NOTE - Follow Up Consult  Pharmacy Consult for UFH/coumadin Indication: atrial fibrillation  Allergies  Allergen Reactions  . Sulfa Antibiotics Swelling    Patient Measurements: Height: 5\' 6"  (167.6 cm) Weight: 217 lb 3.2 oz (98.521 kg) IBW/kg (Calculated) : 63.8  Heparin Dosing Weight: 74kg  Vital Signs: Temp: 98.1 F (36.7 C) (07/22 0841) Temp src: Oral (07/22 0841) BP: 131/73 mmHg (07/22 0841) Pulse Rate: 83  (07/22 0841)  Labs:  Basename 05/10/12 0505 05/09/12 1545 05/09/12 0520 05/08/12 0907 05/07/12 1551  HGB 14.1 -- 14.8 -- --  HCT 39.7 -- 41.7 45.0 --  PLT 172 -- 151 134* --  APTT -- -- -- -- 31  LABPROT 17.7* -- 16.8* -- 15.3*  INR 1.43 -- 1.34 -- 1.18  HEPARINUNFRC 0.51 0.63 0.10* -- --  CREATININE 1.21 -- 1.19 1.11 --  CKTOTAL -- -- -- -- --  CKMB -- -- -- -- --  TROPONINI -- -- -- -- --    Estimated Creatinine Clearance: 58 ml/min (by C-G formula based on Cr of 1.21).   Medications:  Scheduled:    . doxycycline  100 mg Oral Q12H  . moxifloxacin  400 mg Oral Q2000  . Tamsulosin HCl  0.4 mg Oral QPC supper  . Warfarin - Pharmacist Dosing Inpatient   Does not apply q1800  . DISCONTD: ciprofloxacin  400 mg Intravenous Q24H  . DISCONTD: metronidazole  500 mg Intravenous Q8H   Infusions:    . sodium chloride 75 mL/hr at 05/09/12 2235  . heparin 1,900 Units/hr (05/10/12 0045)    Assessment: 77 y/o male patient admitted with new onset afib requiring anticoagulation. Heparin level therapeutic, INR subtherapeutic and trending up, s/p 2 days of coumadin 5mg . Plt better and no bleeding reported.  Goal of Therapy:  INR 2-3 Heparin level 0.3-0.7 units/ml Monitor platelets by anticoagulation protocol: Yes   Plan:  Continue heparin gtt at 1900 units/hr and repeat coumadin 5mg  today. F/u daily protime with xa level.  Verlene Mayer, PharmD, BCPS Pager [phone] 05/10/2012,11:05 AM

## 2012-05-10 NOTE — Anesthesia Postprocedure Evaluation (Signed)
  Anesthesia Post-op Note  Patient: [name]  Procedure(s) Performed: Procedure(s) (LRB): TRANSESOPHAGEAL ECHOCARDIOGRAM (TEE) (N/A) CARDIOVERSION (N/A)  Patient Location: Short Stay  Anesthesia Type: MAC  Level of Consciousness: awake, alert  and oriented  Airway and Oxygen Therapy: Patient Spontanous Breathing and Patient connected to nasal cannula oxygen  Post-op Pain: none  Post-op Assessment: Post-op Vital signs reviewed, Patient's Cardiovascular Status Stable, Respiratory Function Stable, Patent Airway, No signs of Nausea or vomiting and Pain level controlled  Post-op Vital Signs: Reviewed and stable  Complications: No apparent anesthesia complications

## 2012-05-10 NOTE — Progress Notes (Signed)
  Echocardiogram Echocardiogram Transesophageal has been performed.  [name] 05/10/2012, 1:47 PM

## 2012-05-10 NOTE — Transfer of Care (Signed)
Immediate Anesthesia Transfer of Care Note  Patient: [name]  Procedure(s) Performed: Procedure(s) (LRB): TRANSESOPHAGEAL ECHOCARDIOGRAM (TEE) (N/A) CARDIOVERSION (N/A)  Patient Location: Short Stay  Anesthesia Type: MAC  Level of Consciousness: awake, alert  and oriented  Airway & Oxygen Therapy: Patient Spontanous Breathing and Patient connected to nasal cannula oxygen  Post-op Assessment: Report given to PACU RN  Post vital signs: Reviewed and stable  Complications: No apparent anesthesia complications

## 2012-05-10 NOTE — Progress Notes (Addendum)
[facility] Progress Note  05/10/2012  Brief narrative:  77 year old male with a history of coronary artery disease status post CABG who is in otherwise healthy condition who over the last 3 days who has had significant amount of watery diarrhea and no appetite with barely any eating or drinking. He has been having some vague lower abdominal pain. He denies any nausea or vomiting. He says over the last several days he's made hardly any urine. It has taken his wife several days to finally convince him to come to the hospital. He's been experiencing a significant amount of dizziness and progressive worsening weakness when trying to walk over the last 24-48 hours. He's been running fevers, having a significant headache. 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 He denies any rashes. He does not have any kidney issues that he knows of. He denies any chest pain or shortness of breath. He was found to be in acute renal failure and afib.  PT had TEE CV on 7/22 and now in NSR.    Subjective: Pt tolerated TEECV well and now in NSR.  Pt eating lunch now and without complaints.    Objective:  Vital signs in last 24 hours: Filed Vitals:   05/10/12 1330 05/10/12 1400 05/10/12 1410 05/10/12 1414  BP: 137/82 114/61 128/70 128/70  Pulse:      Temp:      TempSrc:      Resp: 14 18 19 19   Height:      Weight:      SpO2: 99% 97% 98% 97%   Weight change: 7.221 kg (15 lb 14.7 oz)  Intake/Output Summary (Last 24 hours) at 05/10/12 1454 Last data filed at 05/10/12 0700  Gross per 24 hour  Intake 1598.6 ml  Output      3 ml  Net 1595.6 ml   No results found for this basename: HGBA1C   Lab Results  Component Value Date   CREATININE 1.21 05/10/2012    Review of Systems As above, otherwise all reviewed and reported negative  Physical Exam General - awake, no distress, cooperative HEENT - NCAT, MMM Lungs - BBS, CTA CV - normal s1, s2 sounds Abd -  soft, nondistended, no masses, nontender Ext - no C/C/E  Lab Results: Results for orders placed during the hospital encounter of 05/05/12 (from the past 24 hour(s))  HEPARIN LEVEL (UNFRACTIONATED)     Status: Normal   Collection Time   05/09/12  3:45 PM      Component Value Range   Heparin Unfractionated 0.63  0.30 - 0.70 IU/mL  CBC     Status: Normal   Collection Time   05/10/12  5:05 AM      Component Value Range   WBC 9.8  4.0 - 10.5 K/uL   RBC 4.80  4.22 - 5.81 MIL/uL   Hemoglobin 14.1  13.0 - 17.0 g/dL   HCT 95.6  21.3 - 08.6 %   MCV 82.7  78.0 - 100.0 fL   MCH 29.4  26.0 - 34.0 pg   MCHC 35.5  30.0 - 36.0 g/dL   RDW 57.8  46.9 - 62.9 %   Platelets 172  150 - 400 K/uL  PROTIME-INR     Status: Abnormal   Collection Time   05/10/12  5:05 AM      Component Value Range   Prothrombin Time 17.7 (*) 11.6 - 15.2 seconds   INR 1.43  0.00 -  1.49  HEPARIN LEVEL (UNFRACTIONATED)     Status: Normal   Collection Time   05/10/12  5:05 AM      Component Value Range   Heparin Unfractionated 0.51  0.30 - 0.70 IU/mL  COMPREHENSIVE METABOLIC PANEL     Status: Abnormal   Collection Time   05/10/12  5:05 AM      Component Value Range   Sodium 140  135 - 145 mEq/L   Potassium 3.8  3.5 - 5.1 mEq/L   Chloride 108  96 - 112 mEq/L   CO2 24  19 - 32 mEq/L   Glucose, Bld 97  70 - 99 mg/dL   BUN 15  6 - 23 mg/dL   Creatinine, Ser 4.09  0.50 - 1.35 mg/dL   Calcium 8.1 (*) 8.4 - 10.5 mg/dL   Total Protein 5.1 (*) 6.0 - 8.3 g/dL   Albumin 2.7 (*) 3.5 - 5.2 g/dL   AST 39 (*) 0 - 37 U/L   ALT 25  0 - 53 U/L   Alkaline Phosphatase 41  39 - 117 U/L   Total Bilirubin 0.3  0.3 - 1.2 mg/dL   GFR calc non Af Amer 57 (*) >90 mL/min   GFR calc Af Amer 66 (*) >90 mL/min    Micro Results: Recent Results (from the past 240 hour(s))  CLOSTRIDIUM DIFFICILE BY PCR     Status: Normal   Collection Time   05/05/12  3:15 PM      Component Value Range Status Comment   C difficile by pcr NEGATIVE  NEGATIVE  Final   STOOL CULTURE     Status: Normal   Collection Time   05/05/12  3:15 PM      Component Value Range Status Comment   Specimen Description STOOL   Final    Special Requests NONE   Final    Culture     Final    Value: NO SALMONELLA, SHIGELLA, CAMPYLOBACTER, YERSINIA, OR E.COLI 0157:H7 ISOLATED   Report Status 05/09/2012 FINAL   Final   MRSA PCR SCREENING     Status: Normal   Collection Time   05/06/12 12:31 AM      Component Value Range Status Comment   MRSA by PCR NEGATIVE  NEGATIVE Final     Medications:  Scheduled Meds:   . doxycycline  100 mg Oral Q12H  . moxifloxacin  400 mg Oral Q2000  . Tamsulosin HCl  0.4 mg Oral QPC supper  . warfarin  5 mg Oral ONCE-1800  . Warfarin - Pharmacist Dosing Inpatient   Does not apply q1800   Continuous Infusions:   . sodium chloride 500 mL (05/10/12 1230)  . sodium chloride 75 mL/hr at 05/09/12 2235  . heparin 1,900 Units/hr (05/10/12 0045)   PRN Meds:.alum & mag hydroxide-simeth, HYDROcodone-acetaminophen, ondansetron (ZOFRAN) IV, ondansetron, DISCONTD: fentaNYL, DISCONTD: lidocaine, DISCONTD: midazolam  Assessment/Plan: *Acute colitis/Diarrhea  *C. difficile PCR negative  *Stool culture negative (final) *Diarrhea has resolved now, pt tolerated reg heart diet now  *CT scan this admission shows bowel wall thickening of the descending colon, cecum and terminal ileum compatible with enterocolitis differential also includes possible inflammatory bowel disease/Crohn's disease versus infection is less likely ischemia, on avelox now *Patient's symptoms are acute in nature and diarrhea is nonbloody so doubt inflammatory bowel disease or ischemia  *Could be a case of severe viral gastroenteritis will continue to monitor and provide supportive care   Acute renal failure/Dehydration/SIRS  *resolved now. Baseline creatinine unknown but  current creatinine has decreased to 1.18 and BUN has decreased from 42 to 24 so appears near euvolemic at  this time *Continue to follow electrolyte panel and continue supportive care  *Was on lisinopril prior to admission and this certainly contributed to progressive renal dysfunction in the setting of dehydration   Abdominal pain, right lower quadrant  *Consistant with enterocolitis findings on CT scan  * patient endorses abdominal pain which is now improving  - with reported black stools, will ask for next stools to be hemocculted   Atrial fibrillation with controlled ventricular response (NEW)  * no prior history of atrial fibrillation currently rate controlled  * [place] score of 2  *Suspect related to dehydration and acute illness to have a low threshold for initiating Coumadin at this point  *Echocardiogram pending  *[facility] consulted  *TSH is normal  s/p TEE CV  Continue warfarin per pharmacy   Fever/Headache/Tick bite  *Multiple tick bites without evidence of wound abscess  *No other rashes consistent with Lyme disease or Rocky none spotted fever  *Empiric treatment as a precaution  *RMSF titer has been obtained but usually would not demonstrate positive status until 10-14 days after tick bite and symptoms  *pt on empiric doxycycline p.o.   Thrombocytopenia  *Platelets 144,000 at presentation and now holding stable, now 172 *Likely thrombocytopenia indicative of degree of dehydration and inflammatory response at presentation but could be indicative of a gram-negative infection  *Urinalysis was not consistent with UTI- only ATN   S/P CABG x 4  *No ischemic changes on EKG but history of coronary disease another risk factor regarding his development of atrial fibrillation   BPH (benign prostatic hyperplasia)  *On Flomax  Urine flow and output slowly improving  Code Status: Full  Family Communication: Patient's wife updated at bedside today  Disposition Plan: Home when cleared by cardiology, possibly tomorrow   LOS: 5 days   Stashia Sia 05/10/2012, 2:54 PM  Cleora Fleet, MD, CDE, FAAFP [facility] [place], [place]  [phone]

## 2012-05-10 NOTE — Progress Notes (Addendum)
Subjective:  Mr. [name] is a 77yo gentleman with a PMH significant for CAD, quadruple CABG in 2007 who was admitted to [facility] on 05/05/12 with severe dehydration, SIRS and ARF thought to be related to enteric or systemic infection.  We are seeing patient for evaluation of his new onset Atrial Fibrillation with ventricular response identified during this admission.  Patient denies awareness or symptoms alerting him as to when the arrhythmia occurs.  Notes that his R ARM from elbow distally to fingers AND abdomen have become more swollen since yesterday.  Yesterday, a right forearm IV site was causing localized swelling and it was removed and obtained in L forearm.  Wrist swelling was significant enough to prompt resizing of ID band by nurse.  Pt denies pain, tingling, redness, numbness or limited ROM in this arm and hand. Patient states normally having swelling in both lower legs since his heart surgery years prior without change since yesterday. Pt reports persistent abdominal pain around his umbilicus and throughout currently due to worsening distension.    ROS:  Confirms orthopnea and coughing when supine, sleeps upright in home elevation bed.  He denies new cough, SOB, DOE, chest, neck, jaw or arm pain, change in vision or level of alertness.   Objective:  Vital Signs in the last 24 hours: Temp:  [97.7 F (36.5 C)-98.7 F (37.1 C)] 98.1 F (36.7 C) (07/22 0841) Pulse Rate:  [77-83] 83  (07/22 0841) Resp:  [18-20] 18  (07/22 0841) BP: (119-149)/(63-81) 131/73 mmHg (07/22 0841) SpO2:  [93 %-97 %] 93 % (07/22 0841) Weight:  [98.521 kg (217 lb 3.2 oz)] 98.521 kg (217 lb 3.2 oz) (07/21 2100)  Intake/Output from previous day: 07/21 0701 - 07/22 0700 In: 1958.6 [P.O.:840; I.V.:1118.6] Out: 3 [Urine:2; Stool:1] Intake/Output from this shift:    Physical Exam: General appearance: alert, cooperative, appears stated age and no distress Neck: no carotid bruit, no JVD, supple, symmetrical, trachea  midline and thyroid not enlarged, symmetric, no tenderness/mass/nodules Lungs: clear to auscultation bilaterally Heart: irregularly irregular rhythm, S1, S2 normal and no S3 or S4 Abdomen: abnormal findings:  distended, hyperactive bowel sounds and no appreciable bruits over AA or renal or iliac arteries Extremities: edema over hands and wrist bilaterally, worse on R with increased fullness to palpation, no pitting.  2+ pitting edema over ankles and chins bilaterally.  No tenderness to palpation and full ROM passive and active of UandLE bilaterally. and no ulcers, gangrene or trophic changes Pulses: 2+ and symmetric radial, brachial, PT and DP bilaterally Skin: mobility and turgor normal, nails normal without clubbing, no evidence of bleeding or bruising and temperature normal Neurologic: Alert and oriented X 3, normal strength and tone. Normal symmetric reflexes. Normal coordination and gait Cranial nerves: normal  Lab Results:  Basename 05/10/12 0505 05/09/12 0520  WBC 9.8 9.7  HGB 14.1 14.8  PLT 172 151    Basename 05/10/12 0505 05/09/12 0520  NA 140 140  K 3.8 3.5  CL 108 108  CO2 24 19  GLUCOSE 97 91  BUN 15 16  CREATININE 1.21 1.19   No results found for this basename: TROPONINI:2,CK,MB:2 in the last 72 hours Hepatic Function Panel  Basename 05/10/12 0505  PROT 5.1*  ALBUMIN 2.7*  AST 39*  ALT 25  ALKPHOS 41  BILITOT 0.3  BILIDIR --  IBILI --   No results found for this basename: CHOL in the last 72 hours No results found for this basename: PROTIME in the last 72 hours  Imaging:   Cardiac Studies:  Assessment/Plan:  Principal Problem:  *Acute colitis Active Problems:  Acute renal failure  Dehydration  S/P CABG x 4: in 2007 (LIMA to LAD, SVG to diagonal, SVG to ramus intermediate, SVG to distal RCA)  Atrial fibrillation with controlled ventricular response (NEW)  Abdominal pain, right lower quadrant  SIRS (systemic inflammatory response syndrome)   Diarrhea  Fever  Headache  Tick bite, RMSF IgG .95, IgM .44  Dizziness - light-headed  BPH (benign prostatic hyperplasia)  Thrombocytopenia   Will go for TEE today with Dr. Rennis Golden at 1300 for [facility] during procedure.  Plan to reassess medical management following observed response to intervention and findings on TEE.  LOS: 5 days    Rhyan Radler R 05/10/2012, 10:47 AM

## 2012-05-10 NOTE — Progress Notes (Signed)
TEE and DCCV by Dr. Rennis Golden is at 1300.

## 2012-05-10 NOTE — H&P (Signed)
     [facility]          INTERVAL PROCEDURE H&P   History and Physical Interval Note:  05/10/2012 12:51 PM  [name] has presented today for their planned procedure. The various methods of treatment have been discussed with the patient and family. After consideration of risks, benefits and other options for treatment, the patient has consented to the procedure.  The patients' outpatient history has been reviewed, patient examined, and no change in status from most recent office note within the past 30 days. I have reviewed the patients' chart and labs and will proceed as planned. Questions were answered to the patient's satisfaction.   Chrystie Nose, MD, [facility] Attending Cardiologist [facility]  Kaytie Ratcliffe C 05/10/2012, 12:51 PM

## 2012-05-10 NOTE — OR Nursing (Signed)
Cardioversion at 1336. 150 joules. Converted to NSR.

## 2012-05-11 ENCOUNTER — Encounter (HOSPITAL_COMMUNITY): Payer: Self-pay | Admitting: Internal Medicine

## 2012-05-11 DIAGNOSIS — Z951 Presence of aortocoronary bypass graft: Secondary | ICD-10-CM

## 2012-05-11 DIAGNOSIS — R6510 Systemic inflammatory response syndrome (SIRS) of non-infectious origin without acute organ dysfunction: Secondary | ICD-10-CM

## 2012-05-11 LAB — PROTIME-INR
INR: 2.07 — ABNORMAL HIGH (ref 0.00–1.49)
Prothrombin Time: 23.7 seconds — ABNORMAL HIGH (ref 11.6–15.2)

## 2012-05-11 LAB — BASIC METABOLIC PANEL
BUN: 15 mg/dL (ref 6–23)
Chloride: 111 mEq/L (ref 96–112)
Creatinine, Ser: 1.22 mg/dL (ref 0.50–1.35)
GFR calc Af Amer: 65 mL/min — ABNORMAL LOW (ref >90)

## 2012-05-11 LAB — CBC
HCT: 40.3 % (ref 39.0–52.0)
MCHC: 34.5 g/dL (ref 30.0–36.0)
Platelets: 204 10*3/uL (ref 150–400)
RDW: 14.8 % (ref 11.5–15.5)

## 2012-05-11 MED ORDER — WARFARIN SODIUM 2.5 MG PO TABS
2.5000 mg | ORAL_TABLET | Freq: Once | ORAL | Status: AC
  Administered 2012-05-11: 2.5 mg via ORAL
  Filled 2012-05-11: qty 1

## 2012-05-11 MED ORDER — WARFARIN SODIUM 2.5 MG PO TABS: 2.5000 mg | ORAL_TABLET | Freq: Once | ORAL | Status: DC

## 2012-05-11 NOTE — Progress Notes (Signed)
ANTICOAGULATION CONSULT NOTE - Follow Up Consult  Pharmacy Consult for heparin and coumadin Indication: atrial fibrillation  Allergies  Allergen Reactions  . Sulfa Antibiotics Swelling    Patient Measurements: Height: 5\' 6"  (167.6 cm) Weight: 217 lb 4.8 oz (98.567 kg) (standing scale #1) IBW/kg (Calculated) : 63.8    Vital Signs: Temp: 98.5 F (36.9 C) (07/23 0500) Temp src: Oral (07/23 0500) BP: 141/78 mmHg (07/23 0500) Pulse Rate: 75  (07/23 0500)  Labs:  Basename 05/11/12 0515 05/10/12 0505 05/09/12 1545 05/09/12 0520  HGB 13.9 14.1 -- --  HCT 40.3 39.7 -- 41.7  PLT 204 172 -- 151  APTT -- -- -- --  LABPROT 23.7* 17.7* -- 16.8*  INR 2.07* 1.43 -- 1.34  HEPARINUNFRC 0.39 0.51 0.63 --  CREATININE 1.22 1.21 -- 1.19  CKTOTAL -- -- -- --  CKMB -- -- -- --  TROPONINI -- -- -- --    Estimated Creatinine Clearance: 57.5 ml/min (by C-G formula based on Cr of 1.22).   Assessment: Patient is a 77 y.o M on anticoagulation for new onset Afib.  S/p DCCV on 7/22 and now in NSR.  INR is therapeutic but has increased noticeably from 1.43 to 2.07.  Increase in INR may be d/t intxn with Avelox and doxy.  Heparin level is at goal  No bleeding noted.  Goal of Therapy:  Heparin level 0.3-0.7 units/ml; INR 2-3 Monitor platelets by anticoagulation protocol: Yes   Plan:  1) Continue current heparin rate 2) Decrease Coumadin to 2.5mg  POx1 today  Barnell Shieh P 05/11/2012,10:05 AM

## 2012-05-11 NOTE — Discharge Summary (Addendum)
Physician Discharge Summary  Patient ID: [name] MRN: [id number] DOB/AGE: [birth year] 77 y.o.  Admit date: 05/05/2012 Discharge date: 05/11/2012  Discharge Diagnoses:  Principal Problem:  *Acute colitis-resolved  Diarrhea - resolved  Acute renal failure - resolved  Dehydration  Fever  Headache  Tick bite, RMSF IgG .95, IgM .44  Abdominal pain, right lower quadrant  Dizziness - light-headed  S/P CABG x 4: in 2007 (LIMA to LAD, SVG to diagonal, SVG to ramus intermediate, SVG to distal RCA)  Atrial fibrillation with controlled ventricular response (NEW) - s/p cardioversion on 7/22  BPH (benign prostatic hyperplasia)  Thrombocytopenia  SIRS (systemic inflammatory response syndrome)- resolved  Recommendations for provider on followup:  Please check BMP and PT/INR, follow and manage warfarin, decide about restarting lisinopril (being held at discharge until pt follows up)   *Acute colitis/Diarrhea  *C. difficile PCR negative  *Stool culture negative (final)  *Diarrhea has resolved now, pt tolerated reg heart diet now  *CT scan this admission shows bowel wall thickening of the descending colon, cecum and terminal ileum compatible with enterocolitis differential also includes possible inflammatory bowel disease/Crohn's disease versus infection is less likely ischemia, on avelox now  *Patient's symptoms are acute in nature and diarrhea is nonbloody so doubt inflammatory bowel disease or ischemia  *Could be a case of severe viral gastroenteritis will continue to monitor and provide supportive care  Completed avelox p.o.   Acute renal failure/Dehydration/SIRS  *resolved now. Baseline creatinine unknown but current creatinine has decreased to 1.18 and BUN has decreased from 42 to 24 so appears near euvolemic at this time  *Continue to follow electrolyte panel   *Was on lisinopril prior to admission and this certainly contributed to progressive renal dysfunction in the setting of  dehydration Holding lisinopril until pt follows up with cardiologist in a couple of days for recheck.   Abdominal pain, right lower quadrant  *Consistant with enterocolitis findings on CT scan  * patient endorses abdominal pain which is now improving    Atrial fibrillation with controlled ventricular response (NEW)  * no prior history of atrial fibrillation currently rate controlled  * [place] score of 2  *Echocardiogram below *[facility] consulted  *TSH is normal  s/p TEE CV still in NSR - to follow closely with [facility] cardiologists Continue warfarin 2.5 mg daily and recheck PT/INR in 2-3 days with [facility] coumadin clinic  Fever/Headache/Tick bite  *Multiple tick bites without evidence of wound abscess  *No other rashes consistent with Lyme disease or Rocky none spotted fever  *Empiric treatment as a precaution  *RMSF titer has been obtained but usually would not demonstrate positive status until 10-14 days after tick bite and symptoms  Completed doxycycline p.o.   Thrombocytopenia - resolved now *Platelets 144,000 at presentation and now holding stable, now 172  *Likely thrombocytopenia indicative of degree of dehydration and inflammatory response at presentation but could be indicative of a gram-negative infection  *Urinalysis was not consistent with UTI- only ATN   S/P CABG x 4  *No ischemic changes on EKG but history of coronary disease another risk factor regarding his development of atrial fibrillation   BPH (benign prostatic hyperplasia)  *On Flomax  Urine flow and output slowly improving   Code Status: Full  Family Communication: Patient's wife updated at bedside today  Disposition Plan: Home with wife  Discharged Condition: Stable  Hospital Course: From H&P:  77 year old male with a history of coronary artery disease status post CABG who is in otherwise healthy condition  who over the last 3 days who has had significant amount of watery diarrhea and no appetite with barely any  eating or drinking. He has been having some vague lower abdominal pain. He denies any nausea or vomiting. He says over the last several days he's may hardly any urine. It is taken his wife several days to finally convince him to come to the hospital. He's been experiencing a significant amount of dizziness and progressive worsening weakness when trying to walk over the last 24-48 hours. He's been running fevers, having a significant headache. 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 He denies any rashes. He does not have any kidney issues that he knows of. He denies any chest pain or shortness of breath.   Consults: [facility] cardiology  Significant Diagnostic Studies: CT Abdomen IMPRESSION:  Bowel wall thickening of the ascending colon, cecum and terminal  ileum compatible with enterocolitis, question inflammatory bowel  disease/Crohn's disease versus infection or less likely ischemia.  Hepatic and right renal cysts.  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  Significant prostatic enlargement.  Tiny umbilical and left inguinal hernias.   TEE LV EF: 55% - 60% Study Conclusions - Left ventricle: Systolic function was normal. The estimated ejection fraction was in the range of 55% to 60%. No evidence of thrombus. - Aortic valve: No evidence of vegetation. - Aorta: Small amount of atheromatous plaque at the aortic arch. - Mitral valve: No evidence of vegetation. Mild central regurgitation. - Left atrium: The atrium was dilated. No evidence of thrombus in the atrial cavity or appendage. - Pulmonary veins: No anomaly. - Right atrium: No evidence of thrombus in the atrial cavity or appendage. - Atrial septum: No defect or patent foramen ovale was identified. No evidence for atrial shunting by color doppler. - Tricuspid valve: No evidence of vegetation. Mild regurgitation. - Pulmonic valve: No evidence of vegetation.  Trivial regurgitation. Impressions:  - No cardiac source of emboli was indentified. Successful cardioversion.  Discharge Exam: Pt without complaints, feels fine to go home, asking to go now.  No CP, no palpitations, no diarrhea, no SOB Blood pressure 136/60, pulse 74, temperature 98.5 F (36.9 C), temperature source Oral, resp. rate 18, height 5\' 6"  (1.676 m), weight 98.567 kg (217 lb 4.8 oz), SpO2 96.00%. General - awake, no distress, cooperative  HEENT - NCAT, MMM Lungs - BBS, CTA  CV - normal s1, s2 sounds  Abd - soft, nondistended, no masses, nontender  Ext - mild pretibial edema  Disposition: 01-Home or Self Care  Discharge Orders    Future Orders Please Complete By Expires   Diet - low sodium heart healthy      Increase activity slowly      Discharge instructions      Comments:   Low sodium diet recommended Daily weight, call heart doctor if you gain more than 2 pounds in 2 days Return if symptoms recur, worsen or new problems develop. Have your PT/INR checked in a couple of days at coumadin clinic with your heart doctor     Medication List  As of 05/11/2012  4:07 PM   STOP taking these medications         LISINOPRIL PO         TAKE these medications         aspirin EC 81 MG tablet   Take 81 mg by mouth daily.      bismuth subsalicylate 262  MG chewable tablet   Commonly known as: PEPTO BISMOL   Chew 524 mg by mouth as needed. For indigestion      ibuprofen 200 MG tablet   Commonly known as: ADVIL,MOTRIN   Take 400 mg by mouth every 6 (six) hours as needed. For headache      Tamsulosin HCl 0.4 MG Caps   Commonly known as: FLOMAX   Take 0.4 mg by mouth daily after supper.      warfarin 2.5 MG tablet   Commonly known as: COUMADIN   Take 1 tablet (2.5 mg total) by mouth one time only at 6 PM.           Follow-up Information    Follow up with Thurmon Fair, MD on 05/26/2012. (11:45am.)    Contact information:   [address] [place] [address] [phone]       Follow up with Thurmon Fair, MD on 05/13/2012. (8 AM.  This is an INR check.  You will not see the cardiologist.)    Contact information:   [address] [place] [address] [phone]         I spent 38 mins preparing discharge, reviewing records, labs, imaging, writing prescriptions, reconciling meds  Signed: Standley Dakins MD 05/11/2012, 4:07 PM [facility] [place], [place] [facility]

## 2012-05-11 NOTE — Progress Notes (Signed)
Pt. Got d/c instructions,and prescriptions.tele was d/c,IV removed.pt. Ready to go home.

## 2012-05-11 NOTE — Progress Notes (Signed)
[facility]  Subjective: Legs are swelling.  Other than that, no complaints.  Objective: Vital signs in last 24 hours: Temp:  [97.9 F (36.6 C)-98.6 F (37 C)] 98.5 F (36.9 C) (07/23 0500) Pulse Rate:  [74-82] 75  (07/23 0500) Resp:  [14-24] 18  (07/23 0500) BP: (114-155)/(61-101) 141/78 mmHg (07/23 0500) SpO2:  [94 %-99 %] 96 % (07/23 0500) FiO2 (%):  [2 %-4 %] 4 % (07/22 1330) Weight:  [98.567 kg (217 lb 4.8 oz)] 98.567 kg (217 lb 4.8 oz) (07/22 2030) Last BM Date: 05/10/12  Intake/Output from previous day: 07/22 0701 - 07/23 0700 In: 1841.3 [P.O.:360; I.V.:1481.3] Out: 250 [Urine:250] Intake/Output this shift:    Medications Current Facility-Administered Medications  Medication Dose Route Frequency Provider Last Rate Last Dose  . 0.45 % sodium chloride infusion   Intravenous Continuous Chrystie Nose, MD 20 mL/hr at 05/10/12 1230 500 mL at 05/10/12 1230  . 0.9 %  sodium chloride infusion   Intravenous Continuous Cl[place] Cyndie Mull, MD 50 mL/hr at 05/11/12 0700    . alum & mag hydroxide-simeth (MAALOX/MYLANTA) 200-200-20 MG/5ML suspension 30 mL  30 mL Oral Q6H PRN Tarry Kos, MD      . doxycycline (VIBRA-TABS) tablet 100 mg  100 mg Oral Q12H Tarry Kos, MD   100 mg at 05/10/12 2258  . heparin ADULT infusion 100 units/mL (25000 units/250 mL)  1,900 Units/hr Intravenous Continuous Colleen Can, PHARMD 19 mL/hr at 05/11/12 0450 1,900 Units/hr at 05/11/12 0450  . HYDROcodone-acetaminophen (NORCO/VICODIN) 5-325 MG per tablet 1-2 tablet  1-2 tablet Oral Q4H PRN Tarry Kos, MD   1 tablet at 05/09/12 2235  . moxifloxacin (AVELOX) tablet 400 mg  400 mg Oral Q2000 Clanford Cyndie Mull, MD   400 mg at 05/10/12 2259  . ondansetron (ZOFRAN) tablet 4 mg  4 mg Oral Q6H PRN Tarry Kos, MD       Or  . ondansetron [facility]) injection 4 mg  4 mg Intravenous Q6H PRN Tarry Kos, MD      . Tamsulosin HCl (FLOMAX) capsule 0.4 mg  0.4 mg Oral QPC  supper Russella Dar, NP   0.4 mg at 05/10/12 1757  . warfarin (COUMADIN) tablet 5 mg  5 mg Oral ONCE-1800 Clanford Cyndie Mull, MD   5 mg at 05/10/12 1757  . Warfarin - Pharmacist Dosing Inpatient   Does not apply q1800 [facility], [place]      . DISCONTD: fentaNYL (SUBLIMAZE) injection    PRN Chrystie Nose, MD   25 mcg at 05/10/12 1315  . DISCONTD: lidocaine (XYLOCAINE) 2 % viscous mouth solution    PRN Chrystie Nose, MD   10 mL at 05/10/12 1311  . DISCONTD: midazolam (VERSED) injection    PRN Chrystie Nose, MD   1 mg at 05/10/12 1318   Facility-Administered Medications Ordered in Other Encounters  Medication Dose Route Frequency Provider Last Rate Last Dose  . DISCONTD: lidocaine (cardiac) 100 mg/15ml (XYLOCAINE) 20 MG/ML injection 2%    PRN Kristopher Key, CRNA   60 mg at 05/10/12 1336  . DISCONTD: propofol (DIPRIVAN) 10 mg/ml infusion    PRN Kristopher Key, CRNA   100 mg at 05/10/12 1336    PE: General appearance: alert, cooperative and no distress Lungs: clear to auscultation bilaterally Heart: regular rate and rhythm and 1/6 sys MM Extremities: 1+ bilateral LEE. Pulses: Radials 1+ and symmetric,1+ DPs Skin: warm and dry. Neurologic: Grossly normal  Lab  Results:   Basename 05/11/12 0515 05/10/12 0505 05/09/12 0520  WBC 9.9 9.8 9.7  HGB 13.9 14.1 14.8  HCT 40.3 39.7 41.7  PLT 204 172 151   BMET  Basename 05/11/12 0515 05/10/12 0505 05/09/12 0520  NA 144 140 140  K 3.5 3.8 3.5  CL 111 108 108  CO2 22 24 19   GLUCOSE 96 97 91  BUN 15 15 16   CREATININE 1.22 1.21 1.19  CALCIUM 8.3* 8.1* 8.1*   PT/INR  Basename 05/11/12 0515 05/10/12 0505 05/09/12 0520  LABPROT 23.7* 17.7* 16.8*  INR 2.07* 1.43 1.34      Assessment/Plan  Principal Problem:  *Acute colitis Active Problems:  Atrial fibrillation with controlled ventricular response (NEW)  Diarrhea  Acute renal failure  Dehydration  Fever  Headache  Tick bite, RMSF IgG .95, IgM .44  Abdominal pain,  right lower quadrant  Dizziness - light-headed  S/P CABG x 4: in 2007 (LIMA to LAD, SVG to diagonal, SVG to ramus intermediate, SVG to distal RCA)  BPH (benign prostatic hyperplasia)  Thrombocytopenia  SIRS (systemic inflammatory response syndrome)  Plan:  SP TEE/DCCV to NSR.  Currently maintaining NSR in the 70's.  He has PVCs and PACs.  INR is therapeutic.  Will setup FU appt for INR and with [facility].  Lower extremity edema likely related to IV fluids for the last two days and Afib.  He has normal EF.  He may have diastolic dysfunction but Echo results were limited due to afib.  I instructed him to monitor his weight when he goes home and make sure it starts decreasing.   LOS: 6 days    [name] 05/11/2012 8:41 AM  I have seen and examined the patient along with Kenneth Finlay, PA NP.  I have reviewed the chart, notes and new data.  I agree with PA/NP's note.  Key new complaints: edema Key examination changes: NSR, PACs Key new findings / data: INR>2  PLAN: DC home, outpt coumadin clinic. Weight monitoring, sodium restriction.  Thurmon Fair, MD, [facility] [phone] 05/11/2012, 9:59 AM

## 2012-12-28 ENCOUNTER — Ambulatory Visit: Payer: Self-pay | Admitting: Cardiovascular Disease

## 2012-12-28 DIAGNOSIS — I4891 Unspecified atrial fibrillation: Secondary | ICD-10-CM

## 2012-12-28 DIAGNOSIS — Z7901 Long term (current) use of anticoagulants: Secondary | ICD-10-CM | POA: Insufficient documentation

## 2013-03-16 ENCOUNTER — Ambulatory Visit (INDEPENDENT_AMBULATORY_CARE_PROVIDER_SITE_OTHER): Payer: Medicare Other | Admitting: Pharmacist Clinician (PhC)/ Clinical Pharmacy Specialist

## 2013-03-16 DIAGNOSIS — I4891 Unspecified atrial fibrillation: Secondary | ICD-10-CM

## 2013-03-16 DIAGNOSIS — Z7901 Long term (current) use of anticoagulants: Secondary | ICD-10-CM

## 2013-03-23 NOTE — Progress Notes (Signed)
Need to fix

## 2013-04-13 ENCOUNTER — Ambulatory Visit: Payer: Medicare Other | Admitting: Pharmacist Clinician (PhC)/ Clinical Pharmacy Specialist

## 2013-04-13 ENCOUNTER — Ambulatory Visit (INDEPENDENT_AMBULATORY_CARE_PROVIDER_SITE_OTHER): Payer: Medicare Other | Admitting: Pharmacist Clinician (PhC)/ Clinical Pharmacy Specialist

## 2013-04-13 DIAGNOSIS — I4891 Unspecified atrial fibrillation: Secondary | ICD-10-CM

## 2013-04-13 DIAGNOSIS — Z7901 Long term (current) use of anticoagulants: Secondary | ICD-10-CM

## 2013-05-12 LAB — PROTIME-INR: INR: 1.64 — ABNORMAL HIGH (ref <1.50)

## 2013-05-16 ENCOUNTER — Telehealth: Payer: Self-pay | Admitting: *Deleted

## 2013-05-16 DIAGNOSIS — Z7901 Long term (current) use of anticoagulants: Secondary | ICD-10-CM

## 2013-05-17 NOTE — Telephone Encounter (Signed)
Order for INR q4-6 weeks mailed to patient

## 2013-05-25 ENCOUNTER — Other Ambulatory Visit: Payer: Self-pay | Admitting: Cardiovascular Disease

## 2013-05-26 ENCOUNTER — Ambulatory Visit (INDEPENDENT_AMBULATORY_CARE_PROVIDER_SITE_OTHER): Payer: Self-pay | Admitting: Pharmacist

## 2013-05-26 ENCOUNTER — Other Ambulatory Visit: Payer: Self-pay | Admitting: *Deleted

## 2013-05-26 DIAGNOSIS — I4891 Unspecified atrial fibrillation: Secondary | ICD-10-CM

## 2013-05-26 DIAGNOSIS — Z7901 Long term (current) use of anticoagulants: Secondary | ICD-10-CM

## 2013-05-26 MED ORDER — WARFARIN SODIUM 5 MG PO TABS: 5.0000 mg | ORAL_TABLET | Freq: Every day | ORAL | Status: DC

## 2013-06-01 LAB — POCT INR: INR: 2

## 2013-06-22 ENCOUNTER — Other Ambulatory Visit: Payer: Self-pay | Admitting: Cardiovascular Disease

## 2013-07-04 ENCOUNTER — Telehealth: Payer: Self-pay | Admitting: Pharmacist Clinician (PhC)/ Clinical Pharmacy Specialist

## 2013-07-04 NOTE — Telephone Encounter (Signed)
Overdue INR letter sent 

## 2013-07-18 ENCOUNTER — Telehealth: Payer: Self-pay | Admitting: Pharmacist Clinician (PhC)/ Clinical Pharmacy Specialist

## 2013-07-18 ENCOUNTER — Ambulatory Visit (INDEPENDENT_AMBULATORY_CARE_PROVIDER_SITE_OTHER): Payer: Medicare Other | Admitting: Pharmacist Clinician (PhC)/ Clinical Pharmacy Specialist

## 2013-07-18 DIAGNOSIS — Z7901 Long term (current) use of anticoagulants: Secondary | ICD-10-CM

## 2013-07-18 DIAGNOSIS — I4891 Unspecified atrial fibrillation: Secondary | ICD-10-CM

## 2013-07-18 NOTE — Telephone Encounter (Signed)
Please call-Received a letter saying  He have not been doing his INRS. He is going to have them.whe

## 2013-07-20 ENCOUNTER — Other Ambulatory Visit: Payer: Self-pay | Admitting: Cardiovascular Disease

## 2013-07-20 LAB — PROTIME-INR
INR: 2.46 — ABNORMAL HIGH (ref <1.50)
Prothrombin Time: 25.7 seconds — ABNORMAL HIGH (ref 11.6–15.2)

## 2013-07-28 ENCOUNTER — Encounter: Payer: Self-pay | Admitting: Cardiovascular Disease

## 2013-08-18 ENCOUNTER — Ambulatory Visit (INDEPENDENT_AMBULATORY_CARE_PROVIDER_SITE_OTHER): Payer: Medicare Other | Admitting: *Deleted

## 2013-08-18 DIAGNOSIS — I4891 Unspecified atrial fibrillation: Secondary | ICD-10-CM

## 2013-08-18 DIAGNOSIS — Z7901 Long term (current) use of anticoagulants: Secondary | ICD-10-CM

## 2013-08-18 LAB — POCT INR: INR: 3.1

## 2013-09-12 ENCOUNTER — Ambulatory Visit (INDEPENDENT_AMBULATORY_CARE_PROVIDER_SITE_OTHER): Payer: Medicare Other | Admitting: *Deleted

## 2013-09-12 DIAGNOSIS — I4891 Unspecified atrial fibrillation: Secondary | ICD-10-CM

## 2013-09-12 DIAGNOSIS — Z7901 Long term (current) use of anticoagulants: Secondary | ICD-10-CM

## 2013-09-12 LAB — POCT INR: INR: 2.8

## 2013-10-17 ENCOUNTER — Ambulatory Visit (INDEPENDENT_AMBULATORY_CARE_PROVIDER_SITE_OTHER): Payer: Medicare Other | Admitting: *Deleted

## 2013-10-17 DIAGNOSIS — I4891 Unspecified atrial fibrillation: Secondary | ICD-10-CM

## 2013-10-17 DIAGNOSIS — Z7901 Long term (current) use of anticoagulants: Secondary | ICD-10-CM

## 2013-10-17 LAB — POCT INR: INR: 3.6

## 2013-11-14 ENCOUNTER — Ambulatory Visit (INDEPENDENT_AMBULATORY_CARE_PROVIDER_SITE_OTHER): Payer: Medicare Other | Admitting: *Deleted

## 2013-11-14 ENCOUNTER — Encounter (INDEPENDENT_AMBULATORY_CARE_PROVIDER_SITE_OTHER): Payer: Self-pay

## 2013-11-14 DIAGNOSIS — Z7901 Long term (current) use of anticoagulants: Secondary | ICD-10-CM

## 2013-11-14 DIAGNOSIS — I4891 Unspecified atrial fibrillation: Secondary | ICD-10-CM

## 2013-11-14 DIAGNOSIS — Z5181 Encounter for therapeutic drug level monitoring: Secondary | ICD-10-CM | POA: Insufficient documentation

## 2013-11-14 LAB — POCT INR: INR: 3

## 2013-12-12 ENCOUNTER — Ambulatory Visit (INDEPENDENT_AMBULATORY_CARE_PROVIDER_SITE_OTHER): Payer: Medicare Other | Admitting: *Deleted

## 2013-12-12 DIAGNOSIS — Z5181 Encounter for therapeutic drug level monitoring: Secondary | ICD-10-CM

## 2013-12-12 DIAGNOSIS — I4891 Unspecified atrial fibrillation: Secondary | ICD-10-CM

## 2013-12-12 DIAGNOSIS — Z7901 Long term (current) use of anticoagulants: Secondary | ICD-10-CM

## 2013-12-12 LAB — POCT INR: INR: 2.9

## 2014-01-09 ENCOUNTER — Encounter: Payer: Self-pay | Admitting: *Deleted

## 2014-01-09 ENCOUNTER — Encounter: Payer: Self-pay | Admitting: Cardiovascular Disease

## 2014-01-09 ENCOUNTER — Ambulatory Visit (INDEPENDENT_AMBULATORY_CARE_PROVIDER_SITE_OTHER): Payer: Medicare Other | Admitting: Cardiovascular Disease

## 2014-01-09 ENCOUNTER — Ambulatory Visit (INDEPENDENT_AMBULATORY_CARE_PROVIDER_SITE_OTHER): Payer: Medicare Other | Admitting: Pharmacist Clinician (PhC)/ Clinical Pharmacy Specialist

## 2014-01-09 VITALS — BP 110/68 | HR 65 | Resp 16 | Ht 66.0 in | Wt 216.3 lb

## 2014-01-09 DIAGNOSIS — I10 Essential (primary) hypertension: Secondary | ICD-10-CM

## 2014-01-09 DIAGNOSIS — Z5181 Encounter for therapeutic drug level monitoring: Secondary | ICD-10-CM

## 2014-01-09 DIAGNOSIS — I4891 Unspecified atrial fibrillation: Secondary | ICD-10-CM

## 2014-01-09 DIAGNOSIS — E669 Obesity, unspecified: Secondary | ICD-10-CM

## 2014-01-09 DIAGNOSIS — Z789 Other specified health status: Secondary | ICD-10-CM

## 2014-01-09 DIAGNOSIS — Z951 Presence of aortocoronary bypass graft: Secondary | ICD-10-CM

## 2014-01-09 LAB — POCT INR: INR: 2.9

## 2014-01-09 MED ORDER — WARFARIN SODIUM 5 MG PO TABS: 5.0000 mg | ORAL_TABLET | Freq: Every day | ORAL | Status: DC

## 2014-01-09 NOTE — Patient Instructions (Signed)
Your physician recommends that you schedule a follow-up appointment in: One year with Dr. Sallyanne Kuster.

## 2014-01-13 ENCOUNTER — Encounter: Payer: Self-pay | Admitting: Cardiovascular Disease

## 2014-01-13 DIAGNOSIS — I10 Essential (primary) hypertension: Secondary | ICD-10-CM | POA: Insufficient documentation

## 2014-01-13 DIAGNOSIS — E669 Obesity, unspecified: Secondary | ICD-10-CM | POA: Insufficient documentation

## 2014-01-13 DIAGNOSIS — Z789 Other specified health status: Secondary | ICD-10-CM | POA: Insufficient documentation

## 2014-01-13 NOTE — Assessment & Plan Note (Signed)
No recent symptomatic events. No neurological episodes. No bleeding complications.

## 2014-01-13 NOTE — Progress Notes (Signed)
Patient ID: [name], male   DOB: 03-Jun-1935, 79 y.o.   MRN: [id number]      Reason for office visit CAD status post CABG, paroxysmal atrial fibrillation  [name] continues to feel very well. 8 years have passed since he underwent bypass surgery. Roughly 2 years have passed since he underwent TEE guided cardioversion for symptomatic atrial fibrillation with rapid ventricular response.   He engages in fairly intense physical activity without complaints of shortness of breath or angina. He has recurrent swelling of his right lower extremity where the saphenectomy harvest site is located. It unit for my resolves by the next morning after overnight recumbency. He denies palpitations, syncope, focal neurological deficits or intermittent claudication. He has not had symptoms to suggest stroke, TIA or other embolic events and has not had bleeding complications on warfarin anticoagulation. He is intolerant to statins, having tried both lipid soluble in water soluble agents with severe myalgia each time.   Allergies  Allergen Reactions  . Clonidine Derivatives   . Statins     Myalgias:  Previously on Crestor,Vytorin,Simvastatin,Lipitor & Pravastatin  . Sulfa Antibiotics Swelling    Current Outpatient Prescriptions  Medication Sig Dispense Refill  . ibuprofen (ADVIL,MOTRIN) 200 MG tablet Take 400 mg by mouth every 6 (six) hours as needed. For headache      . lisinopril-hydrochlorothiazide (PRINZIDE,ZESTORETIC) 20-25 MG per tablet Take 1 tablet by mouth daily. Takes 1/2 tablet if systolic BP is 062-694 or above.  Does not take is BP is lower.      . Tamsulosin HCl (FLOMAX) 0.4 MG CAPS Take 0.4 mg by mouth daily after supper.      . warfarin (COUMADIN) 5 MG tablet Take 1 tablet (5 mg total) by mouth daily.  90 tablet  1   No current facility-administered medications for this visit.    Past Medical History  Diagnosis Date  . Hypertension   . Coronary artery disease   . Atrial  fibrillation     Past Surgical History  Procedure Laterality Date  . Coronary artery bypass graft  12/10/2005    LIMA to LAD,SVG to diagonal,SVG to ramus intermediate,SVG to distal RCA.  . Tonsillectomy    . Tee without cardioversion  05/10/2012    Procedure: TRANSESOPHAGEAL ECHOCARDIOGRAM (TEE);  Surgeon: [name], MD;  Location: [facility] ENDOSCOPY;  Service: Cardiovascular;  Laterality: N/A;  . Cardioversion  05/10/2012    Procedure: CARDIOVERSION;  Surgeon: [name], MD;  Location: [facility] ENDOSCOPY;  Service: Cardiovascular;  Laterality: N/A;  . Cardiac catheterization  10/28/2005    significant 3 vessel CAD  . Nm myocar perf wall motion  09/16/2005    evidence of diaphragmatic attenuation & subtle anterolateral ischemia.    Family History  Problem Relation Age of Onset  . Stroke Mother 79  . Stroke Father 52  . Heart attack Father 43  . Heart attack Brother 68  . Cancer Sister 30    History   Social History  . Marital Status: Married    Spouse Name: N/A    Number of Children: N/A  . Years of Education: N/A   Occupational History  . Not on file.   Social History Main Topics  . Smoking status: Never Smoker   . Smokeless tobacco: Not on file  . Alcohol Use: No  . Drug Use: No  . Sexual Activity:    Other Topics Concern  . Not on file   Social History Narrative  . No narrative on  file    Review of systems: The patient specifically denies any chest pain at rest or with exertion, dyspnea at rest or with exertion, orthopnea, paroxysmal nocturnal dyspnea, syncope, palpitations, focal neurological deficits, intermittent claudication, lower extremity edema, unexplained weight gain, cough, hemoptysis or wheezing.  The patient also denies abdominal pain, nausea, vomiting, dysphagia, diarrhea, constipation, polyuria, polydipsia, dysuria, hematuria, frequency, urgency, abnormal bleeding or bruising, fever, chills, unexpected weight changes, mood swings, change in skin or  hair texture, change in voice quality, auditory or visual problems, allergic reactions or rashes, new musculoskeletal complaints other than usual "aches and pains".   PHYSICAL EXAM BP 110/68  Pulse 65  Resp 16  Ht 5\' 6"  (1.676 m)  Wt 98.113 kg (216 lb 4.8 oz)  BMI 34.93 kg/m2  General: Alert, oriented x3, no distress Head: no evidence of trauma, PERRL, EOMI, no exophtalmos or lid lag, no myxedema, no xanthelasma; normal ears, nose and oropharynx Neck: normal jugular venous pulsations and no hepatojugular reflux; brisk carotid pulses without delay and no carotid bruits Chest: clear to auscultation, no signs of consolidation by percussion or palpation, normal fremitus, symmetrical and full respiratory excursions Cardiovascular: normal position and quality of the apical impulse, regular rhythm, normal first and second heart sounds, no murmurs, rubs or gallops Abdomen: no tenderness or distention, no masses by palpation, no abnormal pulsatility or arterial bruits, normal bowel sounds, no hepatosplenomegaly Extremities: no clubbing, cyanosis; right side he has prominent varicose veins and mild ankle edema; 2+ radial, ulnar and brachial pulses bilaterally; 2+ right femoral, posterior tibial and dorsalis pedis pulses; 2+ left femoral, posterior tibial and dorsalis pedis pulses; no subclavian or femoral bruits Neurological: grossly nonfocal   EKG: Normal sinus rhythm, normal tracing  BMET    Component Value Date/Time   NA 144 05/11/2012 0515   K 3.5 05/11/2012 0515   CL 111 05/11/2012 0515   CO2 22 05/11/2012 0515   GLUCOSE 96 05/11/2012 0515   BUN 15 05/11/2012 0515   CREATININE 1.22 05/11/2012 0515   CALCIUM 8.3* 05/11/2012 0515   GFRNONAA 56* 05/11/2012 0515   GFRAA 65* 05/11/2012 0515     ASSESSMENT AND PLAN S/P CABG x 4: in 2007 (LIMA to LAD, SVG to diagonal, SVG to ramus intermediate, SVG to distal RCA) Asymptomatic. Most risk factors are well addressed with the exception of  hyperlipidemia  Atrial fibrillation with controlled ventricular response (NEW) No recent symptomatic events. No neurological episodes. No bleeding complications.  Essential hypertension Well-controlled  Obesity (BMI 35.0-39.9 without comorbidity) Weight loss recommended     Patient Instructions  Your physician recommends that you schedule a follow-up appointment in: One year with Dr. Sallyanne Kuster.     Orders Placed This Encounter  Procedures  . EKG 12-Lead   Meds ordered this encounter  Medications  . lisinopril-hydrochlorothiazide (PRINZIDE,ZESTORETIC) 20-25 MG per tablet    Sig: Take 1 tablet by mouth daily. Takes 1/2 tablet if systolic BP is 440-102 or above.  Does not take is BP is lower.  . warfarin (COUMADIN) 5 MG tablet    Sig: Take 1 tablet (5 mg total) by mouth daily.    Dispense:  90 tablet    Refill:  [place] Kippy Melena, MD, [facility] [phone] office [phone] pager

## 2014-01-13 NOTE — Assessment & Plan Note (Signed)
Asymptomatic. Most risk factors are well addressed with the exception of hyperlipidemia

## 2014-01-13 NOTE — Assessment & Plan Note (Signed)
Well controlled 

## 2014-01-13 NOTE — Assessment & Plan Note (Signed)
Weight loss recommended 

## 2014-02-02 ENCOUNTER — Ambulatory Visit (INDEPENDENT_AMBULATORY_CARE_PROVIDER_SITE_OTHER): Payer: Medicare Other | Admitting: *Deleted

## 2014-02-02 DIAGNOSIS — I4891 Unspecified atrial fibrillation: Secondary | ICD-10-CM

## 2014-02-02 DIAGNOSIS — Z5181 Encounter for therapeutic drug level monitoring: Secondary | ICD-10-CM

## 2014-02-02 LAB — POCT INR: INR: 3

## 2014-02-06 ENCOUNTER — Telehealth: Payer: Self-pay | Admitting: *Deleted

## 2014-02-06 MED ORDER — WARFARIN SODIUM 5 MG PO TABS: ORAL_TABLET | ORAL | Status: DC

## 2014-02-06 NOTE — Telephone Encounter (Signed)
Patient needs Warfarin sent to [facility] in [place] / tgs

## 2014-03-02 ENCOUNTER — Ambulatory Visit (INDEPENDENT_AMBULATORY_CARE_PROVIDER_SITE_OTHER): Payer: Medicare Other | Admitting: *Deleted

## 2014-03-02 DIAGNOSIS — I4891 Unspecified atrial fibrillation: Secondary | ICD-10-CM

## 2014-03-02 DIAGNOSIS — Z5181 Encounter for therapeutic drug level monitoring: Secondary | ICD-10-CM

## 2014-03-02 LAB — POCT INR: INR: 2.2

## 2014-03-29 ENCOUNTER — Ambulatory Visit (INDEPENDENT_AMBULATORY_CARE_PROVIDER_SITE_OTHER): Payer: Medicare Other | Admitting: *Deleted

## 2014-03-29 DIAGNOSIS — Z5181 Encounter for therapeutic drug level monitoring: Secondary | ICD-10-CM

## 2014-03-29 DIAGNOSIS — I4891 Unspecified atrial fibrillation: Secondary | ICD-10-CM

## 2014-03-29 LAB — POCT INR: INR: 2

## 2014-04-26 ENCOUNTER — Ambulatory Visit (INDEPENDENT_AMBULATORY_CARE_PROVIDER_SITE_OTHER): Payer: Medicare Other | Admitting: *Deleted

## 2014-04-26 DIAGNOSIS — I4891 Unspecified atrial fibrillation: Secondary | ICD-10-CM

## 2014-04-26 DIAGNOSIS — Z5181 Encounter for therapeutic drug level monitoring: Secondary | ICD-10-CM

## 2014-04-26 LAB — POCT INR: INR: 2.3

## 2014-05-24 ENCOUNTER — Ambulatory Visit (INDEPENDENT_AMBULATORY_CARE_PROVIDER_SITE_OTHER): Payer: Medicare Other | Admitting: *Deleted

## 2014-05-24 DIAGNOSIS — Z5181 Encounter for therapeutic drug level monitoring: Secondary | ICD-10-CM

## 2014-05-24 DIAGNOSIS — I4891 Unspecified atrial fibrillation: Secondary | ICD-10-CM

## 2014-05-24 LAB — POCT INR: INR: 2.6

## 2014-05-24 MED ORDER — WARFARIN SODIUM 5 MG PO TABS: ORAL_TABLET | ORAL | Status: DC

## 2014-06-18 ENCOUNTER — Emergency Department (HOSPITAL_COMMUNITY): Payer: Medicare Other

## 2014-06-18 ENCOUNTER — Encounter (HOSPITAL_COMMUNITY): Payer: Self-pay | Admitting: Emergency Medicine

## 2014-06-18 ENCOUNTER — Emergency Department (HOSPITAL_COMMUNITY)
Admission: EM | Admit: 2014-06-18 | Discharge: 2014-06-18 | Disposition: A | Discharge status: Home / Self Care | Payer: Medicare Other | Attending: Emergency Medicine | Admitting: Emergency Medicine

## 2014-06-18 DIAGNOSIS — I10 Essential (primary) hypertension: Secondary | ICD-10-CM | POA: Diagnosis not present

## 2014-06-18 DIAGNOSIS — X500XXA Overexertion from strenuous movement or load, initial encounter: Secondary | ICD-10-CM | POA: Diagnosis not present

## 2014-06-18 DIAGNOSIS — Z951 Presence of aortocoronary bypass graft: Secondary | ICD-10-CM | POA: Insufficient documentation

## 2014-06-18 DIAGNOSIS — Z87891 Personal history of nicotine dependence: Secondary | ICD-10-CM | POA: Diagnosis not present

## 2014-06-18 DIAGNOSIS — S39012A Strain of muscle, fascia and tendon of lower back, initial encounter: Secondary | ICD-10-CM

## 2014-06-18 DIAGNOSIS — Y9389 Activity, other specified: Secondary | ICD-10-CM | POA: Diagnosis not present

## 2014-06-18 DIAGNOSIS — Z9889 Other specified postprocedural states: Secondary | ICD-10-CM | POA: Insufficient documentation

## 2014-06-18 DIAGNOSIS — IMO0002 Reserved for concepts with insufficient information to code with codable children: Secondary | ICD-10-CM | POA: Insufficient documentation

## 2014-06-18 DIAGNOSIS — S335XXA Sprain of ligaments of lumbar spine, initial encounter: Secondary | ICD-10-CM | POA: Diagnosis not present

## 2014-06-18 DIAGNOSIS — Y929 Unspecified place or not applicable: Secondary | ICD-10-CM | POA: Diagnosis not present

## 2014-06-18 DIAGNOSIS — I2510 Atherosclerotic heart disease of native coronary artery without angina pectoris: Secondary | ICD-10-CM | POA: Insufficient documentation

## 2014-06-18 DIAGNOSIS — Z7901 Long term (current) use of anticoagulants: Secondary | ICD-10-CM | POA: Diagnosis not present

## 2014-06-18 DIAGNOSIS — I4891 Unspecified atrial fibrillation: Secondary | ICD-10-CM | POA: Insufficient documentation

## 2014-06-18 DIAGNOSIS — Z79899 Other long term (current) drug therapy: Secondary | ICD-10-CM | POA: Insufficient documentation

## 2014-06-18 LAB — URINALYSIS, ROUTINE W REFLEX MICROSCOPIC
BILIRUBIN URINE: NEGATIVE
Glucose, UA: NEGATIVE mg/dL
Hgb urine dipstick: NEGATIVE
KETONES UR: NEGATIVE mg/dL
Leukocytes, UA: NEGATIVE
NITRITE: NEGATIVE
Protein, ur: NEGATIVE mg/dL
Specific Gravity, Urine: 1.025 (ref 1.005–1.030)
Urobilinogen, UA: 0.2 mg/dL (ref 0.0–1.0)
pH: 5.5 (ref 5.0–8.0)

## 2014-06-18 MED ORDER — METHOCARBAMOL 500 MG PO TABS: 500.0000 mg | ORAL_TABLET | Freq: Three times a day (TID) | ORAL | Status: DC

## 2014-06-18 MED ORDER — HYDROCODONE-ACETAMINOPHEN 5-325 MG PO TABS: 1.0000 | ORAL_TABLET | Freq: Four times a day (QID) | ORAL | Status: DC | PRN

## 2014-06-18 MED ORDER — HYDROCODONE-ACETAMINOPHEN 5-325 MG PO TABS
1.0000 | ORAL_TABLET | Freq: Once | ORAL | Status: AC
  Administered 2014-06-18: 1 via ORAL
  Filled 2014-06-18: qty 1

## 2014-06-18 MED ORDER — METHOCARBAMOL 500 MG PO TABS
1000.0000 mg | ORAL_TABLET | Freq: Once | ORAL | Status: AC
  Administered 2014-06-18: 1000 mg via ORAL
  Filled 2014-06-18: qty 2

## 2014-06-18 NOTE — ED Provider Notes (Signed)
CSN: [id number]     Arrival date & time 06/18/14  1134 History   None   This chart was scribed for non-physician practitioner Lily Kocher, PA-C working with Ezequiel Essex, MD, by Thea Alken, ED Scribe. This patient was seen in room APFT22/APFT22 and the patient's care was started at 12:48 PM. Chief Complaint  Patient presents with  . Back Pain   Patient is a 79 y.o. male presenting with back pain. The history is provided by the patient. No language interpreter was used.  Back Pain  [name] is a 79 y.o. male who presents to the Emergency Department complaining of worsening radiating lower back pain x over 1 week. Pt reports he was stooped over when he felt a "garb" in his back. Pt reports "grabbing" pain with stooping forward that last about a minute. Pt reports pain radiates to left hip. Pt states pain is alleviated with walking but worsened this morning while sitting on a hard bench at church. Pt reports h/o back pain in which he was prescribed a muscle relaxer. Pt denies loss of bowels. Pt denies procedures and surgeries.   Pt denies new or recent injury. Pt has h/o CABG.    Past Medical History  Diagnosis Date  . Hypertension   . Coronary artery disease   . Atrial fibrillation    Past Surgical History  Procedure Laterality Date  . Coronary artery bypass graft  12/10/2005    LIMA to LAD,SVG to diagonal,SVG to ramus intermediate,SVG to distal RCA.  . Tonsillectomy    . Tee without cardioversion  05/10/2012    Procedure: TRANSESOPHAGEAL ECHOCARDIOGRAM (TEE);  Surgeon: [name], MD;  Location: [facility] ENDOSCOPY;  Service: Cardiovascular;  Laterality: N/A;  . Cardioversion  05/10/2012    Procedure: CARDIOVERSION;  Surgeon: [name], MD;  Location: [facility] ENDOSCOPY;  Service: Cardiovascular;  Laterality: N/A;  . Cardiac catheterization  10/28/2005    significant 3 vessel CAD  . Nm myocar perf wall motion  09/16/2005    evidence of diaphragmatic attenuation & subtle  anterolateral ischemia.   Family History  Problem Relation Age of Onset  . Stroke Mother 43  . Stroke Father 22  . Heart attack Father 62  . Heart attack Brother 60  . Cancer Sister 4   History  Substance Use Topics  . Smoking status: Former Research scientist (life sciences)  . Smokeless tobacco: Not on file  . Alcohol Use: No    Review of Systems  Musculoskeletal: Positive for back pain.  All other systems reviewed and are negative.  Allergies  Clonidine derivatives; Statins; and Sulfa antibiotics  Home Medications   Prior to Admission medications   Medication Sig Start Date End Date Taking? Authorizing Provider  Aspirin-Acetaminophen-Caffeine (GOODY HEADACHE PO) Take 1 packet by mouth daily as needed (pain).   Yes Historical Provider, MD  lisinopril-hydrochlorothiazide (PRINZIDE,ZESTORETIC) 20-25 MG per tablet Take 1 tablet by mouth daily. Takes 1/2 tablet if systolic BP is 979-892 or above.  Does not take is BP is lower.   Yes Historical Provider, MD  Tamsulosin HCl (FLOMAX) 0.4 MG CAPS Take 0.4 mg by mouth daily after supper.   Yes Historical Provider, MD  warfarin (COUMADIN) 5 MG tablet Take 1 tablet daily except 1/2 tablet on Mondays 05/24/14  Yes [name], MD   BP 157/68  Pulse 61  Temp(Src) 97.7 F (36.5 C) (Oral)  Resp 14  Ht 5\' 6"  (1.676 m)  Wt 198 lb (89.812 kg)  BMI 31.97 kg/m2  SpO2  98% Physical Exam  Nursing note and vitals reviewed. Constitutional: He is oriented to person, place, and time. He appears well-developed and well-nourished. No distress.  HENT:  Head: Normocephalic and atraumatic.  Eyes: Conjunctivae and EOM are normal.  Neck: Neck supple.  Cardiovascular: Normal rate, regular rhythm, normal heart sounds and normal pulses.  Exam reveals no gallop and no friction rub.   No murmur heard. Pulmonary/Chest: Effort normal and breath sounds normal. No respiratory distress. He has no wheezes. He has no rales. He exhibits no tenderness.  Abdominal: He exhibits no mass.  There is no tenderness.  No pulsating mass   Musculoskeletal: Normal range of motion.  Soreness to palpation to right lower back. Mild spasm of right paraspinal area and lumbar region. No palpable step off. Pain with attempted ROM of right lower back. Peripheral pulses 2+ upper and lower extremities. No temperature changes.   Neurological: He is alert and oriented to person, place, and time. No cranial nerve deficit. He exhibits normal muscle tone. Coordination normal.  Skin: Skin is warm and dry.  Psychiatric: He has a normal mood and affect. His behavior is normal.    ED Course  Procedures  DIAGNOSTIC STUDIES: Oxygen Saturation is 98% on RA, normal by my interpretation.    COORDINATION OF CARE: 1:10 PM- Pt advised of plan for treatment including pain medication and pt agrees. Pt has been advised to use a heating pad.  Labs Review Labs Reviewed - No data to display  Imaging Review No results found.   EKG Interpretation None      MDM Vital signs are within normal limits. Pulse oximetry is 98% on room air. Within normal limits by my interpretation. Back pain can easily be reproduced with palpation and range of motion in the lumbar area. Suspect patient has a muscle strain in the lumbar area. Prescriptions for Norco and Robaxin given to the patient. Patient is encouraged to see the primary physician for additional evaluation and management. It is of note that the patient is on Coumadin, but no bruising or swelling noted in the lumbar or paraspinal areas.    Final diagnoses:  Lumbar strain, initial encounter    I personally performed the services described in this documentation, which was scribed in my presence. The recorded information has been reviewed and is accurate.     Lenox Ahr, PA-C 06/20/14 2132

## 2014-06-18 NOTE — Discharge Instructions (Signed)
Please use heat to your lower back. Please use Robaxin 3 times daily for spasm. Use Tylenol for mild pain, use Norco for more severe pain. Norco and Robaxin may cause drowsiness, please use with caution. Back Pain, Adult Low back pain is very common. About 1 in 5 people have back pain.The cause of low back pain is rarely dangerous. The pain often gets better over time.About half of people with a sudden onset of back pain feel better in just 2 weeks. About 8 in 10 people feel better by 6 weeks.  CAUSES Some common causes of back pain include:  Strain of the muscles or ligaments supporting the spine.  Wear and tear (degeneration) of the spinal discs.  Arthritis.  Direct injury to the back. DIAGNOSIS Most of the time, the direct cause of low back pain is not known.However, back pain can be treated effectively even when the exact cause of the pain is unknown.Answering your caregiver's questions about your overall health and symptoms is one of the most accurate ways to make sure the cause of your pain is not dangerous. If your caregiver needs more information, he or she may order lab work or imaging tests (X-rays or MRIs).However, even if imaging tests show changes in your back, this usually does not require surgery. HOME CARE INSTRUCTIONS For many people, back pain returns.Since low back pain is rarely dangerous, it is often a condition that people can learn to [facility] their own.   Remain active. It is stressful on the back to sit or stand in one place. Do not sit, drive, or stand in one place for more than 30 minutes at a time. Take short walks on level surfaces as soon as pain allows.Try to increase the length of time you walk each day.  Do not stay in bed.Resting more than 1 or 2 days can delay your recovery.  Do not avoid exercise or work.Your body is made to move.It is not dangerous to be active, even though your back may hurt.Your back will likely heal faster if you return to  being active before your pain is gone.  Pay attention to your body when you bend and lift. Many people have less discomfortwhen lifting if they bend their knees, keep the load close to their bodies,and avoid twisting. Often, the most comfortable positions are those that put less stress on your recovering back.  Find a comfortable position to sleep. Use a firm mattress and lie on your side with your knees slightly bent. If you lie on your back, put a pillow under your knees.  Only take over-the-counter or prescription medicines as directed by your caregiver. Over-the-counter medicines to reduce pain and inflammation are often the most helpful.Your caregiver may prescribe muscle relaxant drugs.These medicines help dull your pain so you can more quickly return to your normal activities and healthy exercise.  Put ice on the injured area.  Put ice in a plastic bag.  Place a towel between your skin and the bag.  Leave the ice on for 15-20 minutes, 03-04 times a day for the first 2 to 3 days. After that, ice and heat may be alternated to reduce pain and spasms.  Ask your caregiver about trying back exercises and gentle massage. This may be of some benefit.  Avoid feeling anxious or stressed.Stress increases muscle tension and can worsen back pain.It is important to recognize when you are anxious or stressed and learn ways to manage it.Exercise is a great option. SEEK MEDICAL CARE IF:  You have pain that is not relieved with rest or medicine.  You have pain that does not improve in 1 week.  You have new symptoms.  You are generally not feeling well. SEEK IMMEDIATE MEDICAL CARE IF:   You have pain that radiates from your back into your legs.  You develop new bowel or bladder control problems.  You have unusual weakness or numbness in your arms or legs.  You develop nausea or vomiting.  You develop abdominal pain.  You feel faint. Document Released: 10/06/2005 Document Revised:  04/06/2012 Document Reviewed: 02/07/2014 [facility] Patient Information 2015 [place], [place]. This information is not intended to replace advice given to you by your health care provider. Make sure you discuss any questions you have with your health care provider.

## 2014-06-18 NOTE — ED Notes (Signed)
Pt reports lower back pain x1 week radiating to RLE. Pt denies any recent injury. nad noted.

## 2014-06-21 NOTE — ED Provider Notes (Signed)
Medical screening examination/treatment/procedure(s) were conducted as a shared visit with non-physician practitioner(s) and myself.  I personally evaluated the patient during the encounter.  One week of R low back pain.  No weakness, numbness, tingling. No incontinence, fever, or vomiting. Denies trauma but has been moving hay recently. 5/5 strength in bilateral lower extremities. Ankle plantar and dorsiflexion intact. Great toe extension intact bilaterally. +2 DP and PT pulses. +2 patellar reflexes bilaterally. Normal gait.    EKG Interpretation None       Ezequiel Essex, MD 06/21/14 1109

## 2014-06-22 ENCOUNTER — Ambulatory Visit (INDEPENDENT_AMBULATORY_CARE_PROVIDER_SITE_OTHER): Payer: Medicare Other | Admitting: Pharmacist

## 2014-06-22 DIAGNOSIS — I4891 Unspecified atrial fibrillation: Secondary | ICD-10-CM

## 2014-06-22 DIAGNOSIS — Z5181 Encounter for therapeutic drug level monitoring: Secondary | ICD-10-CM

## 2014-06-22 LAB — POCT INR: INR: 3.2

## 2014-07-17 ENCOUNTER — Ambulatory Visit (HOSPITAL_COMMUNITY)
Admission: RE | Admit: 2014-07-17 | Discharge: 2014-07-17 | Disposition: A | Discharge status: Home / Self Care | Payer: Medicare Other | Source: Ambulatory Visit | Attending: Family Medicine | Admitting: Family Medicine

## 2014-07-17 ENCOUNTER — Other Ambulatory Visit (HOSPITAL_COMMUNITY): Payer: Self-pay | Admitting: Family Medicine

## 2014-07-17 DIAGNOSIS — M545 Low back pain, unspecified: Secondary | ICD-10-CM | POA: Insufficient documentation

## 2014-07-19 ENCOUNTER — Ambulatory Visit (INDEPENDENT_AMBULATORY_CARE_PROVIDER_SITE_OTHER): Payer: Medicare Other | Admitting: *Deleted

## 2014-07-19 DIAGNOSIS — Z5181 Encounter for therapeutic drug level monitoring: Secondary | ICD-10-CM

## 2014-07-19 DIAGNOSIS — I4891 Unspecified atrial fibrillation: Secondary | ICD-10-CM

## 2014-07-19 LAB — POCT INR: INR: 2.7

## 2014-08-16 ENCOUNTER — Ambulatory Visit (INDEPENDENT_AMBULATORY_CARE_PROVIDER_SITE_OTHER): Payer: Medicare Other | Admitting: *Deleted

## 2014-08-16 DIAGNOSIS — I4891 Unspecified atrial fibrillation: Secondary | ICD-10-CM

## 2014-08-16 DIAGNOSIS — Z5181 Encounter for therapeutic drug level monitoring: Secondary | ICD-10-CM

## 2014-08-16 LAB — POCT INR: INR: 2.5

## 2014-09-18 ENCOUNTER — Ambulatory Visit (INDEPENDENT_AMBULATORY_CARE_PROVIDER_SITE_OTHER): Payer: Medicare Other | Admitting: *Deleted

## 2014-09-18 DIAGNOSIS — I4891 Unspecified atrial fibrillation: Secondary | ICD-10-CM

## 2014-09-18 DIAGNOSIS — Z5181 Encounter for therapeutic drug level monitoring: Secondary | ICD-10-CM

## 2014-09-18 LAB — POCT INR: INR: 3

## 2014-09-27 ENCOUNTER — Encounter: Payer: Self-pay | Admitting: Cardiovascular Disease

## 2014-10-18 ENCOUNTER — Ambulatory Visit (INDEPENDENT_AMBULATORY_CARE_PROVIDER_SITE_OTHER): Payer: Medicare Other | Admitting: *Deleted

## 2014-10-18 DIAGNOSIS — Z5181 Encounter for therapeutic drug level monitoring: Secondary | ICD-10-CM

## 2014-10-18 DIAGNOSIS — I4891 Unspecified atrial fibrillation: Secondary | ICD-10-CM

## 2014-10-18 LAB — POCT INR: INR: 2.6

## 2014-11-29 ENCOUNTER — Ambulatory Visit (INDEPENDENT_AMBULATORY_CARE_PROVIDER_SITE_OTHER): Payer: Medicare Other | Admitting: *Deleted

## 2014-11-29 DIAGNOSIS — I4891 Unspecified atrial fibrillation: Secondary | ICD-10-CM

## 2014-11-29 DIAGNOSIS — Z5181 Encounter for therapeutic drug level monitoring: Secondary | ICD-10-CM

## 2014-11-29 LAB — POCT INR: INR: 2.2

## 2014-12-20 ENCOUNTER — Telehealth: Payer: Self-pay | Admitting: Cardiovascular Disease

## 2014-12-20 NOTE — Telephone Encounter (Signed)
[name] is calling in wanting see if it is ok to  Change the manufacturer on the warfarin.

## 2014-12-20 NOTE — Telephone Encounter (Signed)
Verbal OK for warfarin manufacturer change.

## 2014-12-26 ENCOUNTER — Inpatient Hospital Stay (HOSPITAL_COMMUNITY)
Admission: EM | Admit: 2014-12-26 | Discharge: 2014-12-29 | DRG: 439 | Disposition: A | Discharge status: Home / Self Care | Payer: Medicare Other | Attending: Internal Medicine | Admitting: Internal Medicine

## 2014-12-26 ENCOUNTER — Encounter (HOSPITAL_COMMUNITY): Payer: Self-pay | Admitting: Emergency Medicine

## 2014-12-26 ENCOUNTER — Emergency Department (HOSPITAL_COMMUNITY): Payer: Medicare Other

## 2014-12-26 DIAGNOSIS — N179 Acute kidney failure, unspecified: Secondary | ICD-10-CM | POA: Diagnosis present

## 2014-12-26 DIAGNOSIS — I10 Essential (primary) hypertension: Secondary | ICD-10-CM | POA: Diagnosis present

## 2014-12-26 DIAGNOSIS — Z951 Presence of aortocoronary bypass graft: Secondary | ICD-10-CM | POA: Diagnosis not present

## 2014-12-26 DIAGNOSIS — I2510 Atherosclerotic heart disease of native coronary artery without angina pectoris: Secondary | ICD-10-CM | POA: Diagnosis present

## 2014-12-26 DIAGNOSIS — Z87891 Personal history of nicotine dependence: Secondary | ICD-10-CM | POA: Diagnosis not present

## 2014-12-26 DIAGNOSIS — Z7901 Long term (current) use of anticoagulants: Secondary | ICD-10-CM

## 2014-12-26 DIAGNOSIS — R1013 Epigastric pain: Secondary | ICD-10-CM

## 2014-12-26 DIAGNOSIS — R1031 Right lower quadrant pain: Secondary | ICD-10-CM | POA: Diagnosis not present

## 2014-12-26 DIAGNOSIS — I4891 Unspecified atrial fibrillation: Secondary | ICD-10-CM | POA: Diagnosis present

## 2014-12-26 DIAGNOSIS — Z888 Allergy status to other drugs, medicaments and biological substances status: Secondary | ICD-10-CM | POA: Diagnosis not present

## 2014-12-26 DIAGNOSIS — Z882 Allergy status to sulfonamides status: Secondary | ICD-10-CM | POA: Diagnosis not present

## 2014-12-26 DIAGNOSIS — K859 Acute pancreatitis without necrosis or infection, unspecified: Secondary | ICD-10-CM | POA: Diagnosis present

## 2014-12-26 DIAGNOSIS — I129 Hypertensive chronic kidney disease with stage 1 through stage 4 chronic kidney disease, or unspecified chronic kidney disease: Secondary | ICD-10-CM | POA: Diagnosis present

## 2014-12-26 DIAGNOSIS — R300 Dysuria: Secondary | ICD-10-CM | POA: Diagnosis present

## 2014-12-26 DIAGNOSIS — R52 Pain, unspecified: Secondary | ICD-10-CM | POA: Diagnosis not present

## 2014-12-26 DIAGNOSIS — Z7982 Long term (current) use of aspirin: Secondary | ICD-10-CM | POA: Diagnosis not present

## 2014-12-26 DIAGNOSIS — E860 Dehydration: Secondary | ICD-10-CM | POA: Diagnosis present

## 2014-12-26 DIAGNOSIS — R109 Unspecified abdominal pain: Secondary | ICD-10-CM | POA: Diagnosis not present

## 2014-12-26 DIAGNOSIS — N289 Disorder of kidney and ureter, unspecified: Secondary | ICD-10-CM

## 2014-12-26 DIAGNOSIS — N183 Chronic kidney disease, stage 3 (moderate): Secondary | ICD-10-CM | POA: Diagnosis present

## 2014-12-26 DIAGNOSIS — K858 Other acute pancreatitis: Secondary | ICD-10-CM | POA: Diagnosis not present

## 2014-12-26 LAB — CBC WITH DIFFERENTIAL/PLATELET
BASOS ABS: 0 10*3/uL (ref 0.0–0.1)
BASOS PCT: 0 % (ref 0–1)
EOS ABS: 0 10*3/uL (ref 0.0–0.7)
EOS PCT: 0 % (ref 0–5)
HCT: 51.8 % (ref 39.0–52.0)
Hemoglobin: 18 g/dL — ABNORMAL HIGH (ref 13.0–17.0)
Lymphocytes Relative: 9 % — ABNORMAL LOW (ref 12–46)
Lymphs Abs: 1.4 10*3/uL (ref 0.7–4.0)
MCH: 29.8 pg (ref 26.0–34.0)
MCHC: 34.7 g/dL (ref 30.0–36.0)
MCV: 85.6 fL (ref 78.0–100.0)
Monocytes Absolute: 0.8 10*3/uL (ref 0.1–1.0)
Monocytes Relative: 5 % (ref 3–12)
Neutro Abs: 13.1 10*3/uL — ABNORMAL HIGH (ref 1.7–7.7)
Neutrophils Relative %: 86 % — ABNORMAL HIGH (ref 43–77)
PLATELETS: 157 10*3/uL (ref 150–400)
RBC: 6.05 MIL/uL — ABNORMAL HIGH (ref 4.22–5.81)
RDW: 13.7 % (ref 11.5–15.5)
WBC: 15.3 10*3/uL — AB (ref 4.0–10.5)

## 2014-12-26 LAB — COMPREHENSIVE METABOLIC PANEL
ALBUMIN: 4.5 g/dL (ref 3.5–5.2)
ALK PHOS: 64 U/L (ref 39–117)
ALT: 13 U/L (ref 0–53)
ANION GAP: 8 (ref 5–15)
AST: 19 U/L (ref 0–37)
BILIRUBIN TOTAL: 0.8 mg/dL (ref 0.3–1.2)
BUN: 25 mg/dL — ABNORMAL HIGH (ref 6–23)
CALCIUM: 9.3 mg/dL (ref 8.4–10.5)
CO2: 24 mmol/L (ref 19–32)
Chloride: 106 mmol/L (ref 96–112)
Creatinine, Ser: 1.55 mg/dL — ABNORMAL HIGH (ref 0.50–1.35)
GFR calc Af Amer: 48 mL/min — ABNORMAL LOW (ref >90)
GFR, EST NON AFRICAN AMERICAN: 41 mL/min — AB (ref >90)
GLUCOSE: 121 mg/dL — AB (ref 70–99)
Potassium: 3.8 mmol/L (ref 3.5–5.1)
Sodium: 138 mmol/L (ref 135–145)
TOTAL PROTEIN: 7.3 g/dL (ref 6.0–8.3)

## 2014-12-26 LAB — LIPASE, BLOOD: LIPASE: 768 U/L — AB (ref 11–59)

## 2014-12-26 LAB — TRIGLYCERIDES: TRIGLYCERIDES: 281 mg/dL — AB (ref <150)

## 2014-12-26 LAB — TROPONIN I

## 2014-12-26 MED ORDER — TAMSULOSIN HCL 0.4 MG PO CAPS
0.4000 mg | ORAL_CAPSULE | Freq: Every day | ORAL | Status: DC
  Administered 2014-12-26 – 2014-12-28 (×3): 0.4 mg via ORAL
  Filled 2014-12-26: qty 1

## 2014-12-26 MED ORDER — ONDANSETRON HCL 4 MG/2ML IJ SOLN: 4.0000 mg | Freq: Three times a day (TID) | INTRAMUSCULAR | Status: AC | PRN

## 2014-12-26 MED ORDER — SIMETHICONE 80 MG PO CHEW
80.0000 mg | CHEWABLE_TABLET | Freq: Once | ORAL | Status: AC
  Administered 2014-12-26: 80 mg via ORAL
  Filled 2014-12-26: qty 1

## 2014-12-26 MED ORDER — SODIUM CHLORIDE 0.9 % IV SOLN
INTRAVENOUS | Status: AC
  Administered 2014-12-26 – 2014-12-27 (×2): via INTRAVENOUS

## 2014-12-26 MED ORDER — IOHEXOL 300 MG/ML  SOLN
50.0000 mL | Freq: Once | INTRAMUSCULAR | Status: AC | PRN
  Administered 2014-12-26: 50 mL via ORAL

## 2014-12-26 MED ORDER — SODIUM CHLORIDE 0.9 % IV SOLN
INTRAVENOUS | Status: DC
  Administered 2014-12-26: 21:00:00 via INTRAVENOUS

## 2014-12-26 MED ORDER — HYDROMORPHONE HCL 1 MG/ML IJ SOLN
1.0000 mg | INTRAMUSCULAR | Status: DC | PRN
  Administered 2014-12-26 – 2014-12-28 (×5): 1 mg via INTRAVENOUS
  Filled 2014-12-26 (×5): qty 1

## 2014-12-26 MED ORDER — ONDANSETRON HCL 4 MG PO TABS: 4.0000 mg | ORAL_TABLET | Freq: Four times a day (QID) | ORAL | Status: DC | PRN

## 2014-12-26 MED ORDER — TAMSULOSIN HCL 0.4 MG PO CAPS: 0.4000 mg | ORAL_CAPSULE | Freq: Once | ORAL | Status: DC

## 2014-12-26 MED ORDER — SODIUM CHLORIDE 0.9 % IV BOLUS (SEPSIS)
500.0000 mL | Freq: Once | INTRAVENOUS | Status: AC
  Administered 2014-12-26: 500 mL via INTRAVENOUS

## 2014-12-26 MED ORDER — FENTANYL CITRATE 0.05 MG/ML IJ SOLN
50.0000 ug | Freq: Once | INTRAMUSCULAR | Status: AC
  Administered 2014-12-26: 50 ug via INTRAVENOUS
  Filled 2014-12-26: qty 2

## 2014-12-26 MED ORDER — METOPROLOL TARTRATE 1 MG/ML IV SOLN
5.0000 mg | Freq: Four times a day (QID) | INTRAVENOUS | Status: DC | PRN
  Administered 2014-12-26: 5 mg via INTRAVENOUS
  Filled 2014-12-26: qty 5

## 2014-12-26 MED ORDER — HYDROMORPHONE HCL 1 MG/ML IJ SOLN
0.5000 mg | INTRAMUSCULAR | Status: AC | PRN
Start: 2014-12-26 — End: 2014-12-27

## 2014-12-26 MED ORDER — ONDANSETRON HCL 4 MG/2ML IJ SOLN
4.0000 mg | Freq: Four times a day (QID) | INTRAMUSCULAR | Status: DC | PRN
  Administered 2014-12-26: 4 mg via INTRAVENOUS
  Filled 2014-12-26: qty 2

## 2014-12-26 MED ORDER — TAMSULOSIN HCL 0.4 MG PO CAPS
0.4000 mg | ORAL_CAPSULE | Freq: Every day | ORAL | Status: DC
  Filled 2014-12-26: qty 1

## 2014-12-26 MED ORDER — SODIUM CHLORIDE 0.9 % IV SOLN: INTRAVENOUS | Status: DC

## 2014-12-26 NOTE — ED Provider Notes (Signed)
CSN: [id number]     Arrival date & time 12/26/14  1433 History  This chart was scribed for Carmin Muskrat, MD by Molli Posey, ED Scribe. This patient was seen in room APA10/APA10 and the patient's care was started 6:28 PM.     Chief Complaint  Patient presents with  . Chest Pain   The history is provided by the patient. No language interpreter was used.   HPI Comments: [name] is a 80 y.o. male with a history of CABG, CAD, A-fib and cardioversion who presents to the Emergency Department complaining of chest pain that started this morning at 2AM. Pt reports associated lower abdoiminal pain, distension, nausea, vomiting and SOB. He says he has not had a BM today which is atypical. Pt states he takes warfarin, HTN medication and flomax. He states that he has taken aleve and pepto bismal PTA. Pt reports no history of abdominal surgery. He denies cough, fever, any urinary symptoms, falls, syncope and unilateral weakness.    Past Medical History  Diagnosis Date  . Hypertension   . Coronary artery disease   . Atrial fibrillation    Past Surgical History  Procedure Laterality Date  . Coronary artery bypass graft  12/10/2005    LIMA to LAD,SVG to diagonal,SVG to ramus intermediate,SVG to distal RCA.  . Tonsillectomy    . Tee without cardioversion  05/10/2012    Procedure: TRANSESOPHAGEAL ECHOCARDIOGRAM (TEE);  Surgeon: [name], MD;  Location: [facility] ENDOSCOPY;  Service: Cardiovascular;  Laterality: N/A;  . Cardioversion  05/10/2012    Procedure: CARDIOVERSION;  Surgeon: [name], MD;  Location: [facility] ENDOSCOPY;  Service: Cardiovascular;  Laterality: N/A;  . Cardiac catheterization  10/28/2005    significant 3 vessel CAD  . Nm myocar perf wall motion  09/16/2005    evidence of diaphragmatic attenuation & subtle anterolateral ischemia.   Family History  Problem Relation Age of Onset  . Stroke Mother 54  . Stroke Father 59  . Heart attack Father 21  . Heart attack Brother  18  . Cancer Sister 62   History  Substance Use Topics  . Smoking status: Former Research scientist (life sciences)  . Smokeless tobacco: Not on file  . Alcohol Use: No    Review of Systems  Constitutional:       Per HPI, otherwise negative  HENT:       Per HPI, otherwise negative  Respiratory:       Per HPI, otherwise negative  Cardiovascular: Positive for chest pain.       Per HPI, otherwise negative  Gastrointestinal: Positive for nausea, vomiting and abdominal pain.  Endocrine:       Negative aside from HPI  Genitourinary: Negative for dysuria.       Neg aside from HPI   Musculoskeletal:       Per HPI, otherwise negative  Skin: Negative.   Neurological: Negative for syncope.      Allergies  Clonidine derivatives; Statins; and Sulfa antibiotics  Home Medications   Prior to Admission medications   Medication Sig Start Date End Date Taking? Authorizing Provider  Aspirin-Acetaminophen-Caffeine (GOODY HEADACHE PO) Take 1 packet by mouth daily as needed (pain).    Historical Provider, MD  HYDROcodone-acetaminophen (NORCO/VICODIN) 5-325 MG per tablet Take 1 tablet by mouth every 6 (six) hours as needed. 06/18/14   Lily Kocher, PA-C  lisinopril-hydrochlorothiazide (PRINZIDE,ZESTORETIC) 20-25 MG per tablet Take 1 tablet by mouth daily. Takes 1/2 tablet if systolic BP is 784-696 or above.  Does  not take is BP is lower.    Historical Provider, MD  methocarbamol (ROBAXIN) 500 MG tablet Take 1 tablet (500 mg total) by mouth 3 (three) times daily. 06/18/14   Lily Kocher, PA-C  Tamsulosin HCl (FLOMAX) 0.4 MG CAPS Take 0.4 mg by mouth daily after supper.    Historical Provider, MD  warfarin (COUMADIN) 5 MG tablet Take 1 tablet daily except 1/2 tablet on Mondays 05/24/14   [name], MD   BP 189/97 mmHg  Pulse 83  Temp(Src) 97.8 F (36.6 C) (Oral)  Resp 16  Ht 5\' 6"  (1.676 m)  Wt 213 lb (96.616 kg)  BMI 34.40 kg/m2  SpO2 96% Physical Exam  Constitutional: He is oriented to person, place, and  time. He appears well-developed. No distress.  HENT:  Head: Normocephalic and atraumatic.  Eyes: Conjunctivae and EOM are normal.  Cardiovascular: Normal rate, regular rhythm and normal heart sounds.   Pulmonary/Chest: Effort normal and breath sounds normal. No stridor. No respiratory distress. He has no wheezes. He has no rales.  Abdominal: He exhibits no distension. There is tenderness. There is guarding.  Audible bowel sounds.   Musculoskeletal: He exhibits no edema.  Neurological: He is alert and oriented to person, place, and time.  Skin: Skin is warm and dry.  Psychiatric: He has a normal mood and affect.  Nursing note and vitals reviewed.   ED Course  Procedures   DIAGNOSTIC STUDIES: Oxygen Saturation is 96% on RA, normal by my interpretation.    COORDINATION OF CARE: 6:32 PM Discussed treatment plan with pt at bedside and pt agreed to plan.   Labs Review Labs Reviewed  CBC WITH DIFFERENTIAL/PLATELET - Abnormal; Notable for the following:    WBC 15.3 (*)    RBC 6.05 (*)    Hemoglobin 18.0 (*)    Neutrophils Relative % 86 (*)    Neutro Abs 13.1 (*)    Lymphocytes Relative 9 (*)    All other components within normal limits  COMPREHENSIVE METABOLIC PANEL - Abnormal; Notable for the following:    Glucose, Bld 121 (*)    BUN 25 (*)    Creatinine, Ser 1.55 (*)    GFR calc non Af Amer 41 (*)    GFR calc Af Amer 48 (*)    All other components within normal limits  LIPASE, BLOOD - Abnormal; Notable for the following:    Lipase 768 (*)    All other components within normal limits  TROPONIN I    Imaging Review Ct Abdomen Pelvis Wo Contrast  12/26/2014   CLINICAL DATA:  Nausea and vomiting. Decreased bowel sounds. Upper abdominal pain. Distention and shortness of breath. No bowel movement today.  EXAM: CT ABDOMEN AND PELVIS WITHOUT CONTRAST  TECHNIQUE: Multidetector CT imaging of the abdomen and pelvis was performed following the standard protocol without IV contrast.   COMPARISON:  06/18/2014  FINDINGS: Atelectasis in the lung bases. Mild cardiac enlargement. Coronary artery and aortic valve calcification. Postoperative changes in the mediastinum.  Evaluation of solid organs and vascular structures is limited without IV contrast material. Low-attenuation lesions are demonstrated in the liver, measuring up to 18 mm diameter. These are unchanged since prior study and probably represent cysts. There is infiltration and edema demonstrated in the region of the head of the pancreas and extending down along the anterior perirenal fascia on the right and into the retroperitoneum and along the iliopsoas muscle region of the pelvis. This likely represents inflammatory process. Consider early acute pancreatitis versus  retroperitoneal inflammatory process. Fat density lesion at the body of the pancreas. This is not changed since prior study. No bile duct dilatation. The unenhanced appearance of the gallbladder, spleen, adrenal glands, inferior vena cava, and retroperitoneal lymph nodes is unremarkable. Lesion in the lower pole right kidney probably representing cyst. No change since prior study. No hydronephrosis in either kidney. Calcification of abdominal aorta and branch vessels. No aneurysm. Stomach, small bowel, and colon are not abnormally distended. No free air in the abdomen.  Pelvis: Prostate gland is enlarged. Bladder wall is not thickened. No pelvic mass or lymphadenopathy. Appendix is normal. No free or loculated pelvic fluid collections. Degenerative changes in the spine. No destructive bone lesions.  IMPRESSION: Inflammatory infiltration around the head of the pancreas, retroperitoneum, and a right ileus psoas region. This probably represents early changes of acute pancreatitis although duodenitis or other retroperitoneal inflammatory process could also have this appearance. Unchanged appearance of hepatic and renal cysts. Fat density lesion in the pancreas probably represents a  lipoma.   Electronically Signed   By: Lucienne Capers M.D.   On: 12/26/2014 20:45   Dg Chest 2 View  12/26/2014   CLINICAL DATA:  Chest pain, lower abdominal pain, shortness of breath.  EXAM: CHEST  2 VIEW  COMPARISON:  08/19/2010  FINDINGS: Prior CABG. Bibasilar linear and platelike densities, most likely atelectasis. Heart is normal size. No effusions. No acute bony abnormality.  IMPRESSION: Bibasilar opacities, likely atelectasis.   Electronically Signed   By: Rolm Baptise M.D.   On: 12/26/2014 15:04    9:30 PM Patient aware of all results.  MDM   Final diagnoses:  Acute pancreatitis, unspecified pancreatitis type     I personally performed the services described in this documentation, which was scribed in my presence. The recorded information has been reviewed and is accurate.   Patient presents with epigastric pain, nausea, anorexia. Patient is than pancreatitis, with no evidence for peritonitis. He has worsening renal disease, but no e/o hepatobiliary dysfunction.  He was admitted for further E/M.   Carmin Muskrat, MD 12/26/14 2131

## 2014-12-26 NOTE — H&P (Signed)
PCP:   Leonides Grills, MD   Chief Complaint:  abd pain  HPI: 80 yo male with acute onset of epigastric abd pain that started around midnight last night.  Pain has been progressively worsening through the day.  Several episodes of bilious vomiting.  Has not eaten or drank anything all day except some milk which seem to make him feel better.  No diarrhea.  No fever.  Still has gallbladder.  No new medications.  Review of Systems:  Positive and negative as per HPI otherwise all other systems are negative  Past Medical History: Past Medical History  Diagnosis Date  . Hypertension   . Coronary artery disease   . Atrial fibrillation    Past Surgical History  Procedure Laterality Date  . Coronary artery bypass graft  12/10/2005    LIMA to LAD,SVG to diagonal,SVG to ramus intermediate,SVG to distal RCA.  . Tonsillectomy    . Tee without cardioversion  05/10/2012    Procedure: TRANSESOPHAGEAL ECHOCARDIOGRAM (TEE);  Surgeon: [name], MD;  Location: [facility] ENDOSCOPY;  Service: Cardiovascular;  Laterality: N/A;  . Cardioversion  05/10/2012    Procedure: CARDIOVERSION;  Surgeon: [name], MD;  Location: [facility] ENDOSCOPY;  Service: Cardiovascular;  Laterality: N/A;  . Cardiac catheterization  10/28/2005    significant 3 vessel CAD  . Nm myocar perf wall motion  09/16/2005    evidence of diaphragmatic attenuation & subtle anterolateral ischemia.    Medications: Prior to Admission medications   Medication Sig Start Date End Date Taking? Authorizing Provider  aspirin EC 81 MG tablet Take 81 mg by mouth every morning.   Yes Historical Provider, MD  lisinopril-hydrochlorothiazide (PRINZIDE,ZESTORETIC) 20-25 MG per tablet Take 0.5 tablets by mouth daily.    Yes Historical Provider, MD  Tamsulosin HCl (FLOMAX) 0.4 MG CAPS Take 0.4 mg by mouth daily after supper.   Yes Historical Provider, MD  warfarin (COUMADIN) 5 MG tablet Take 1 tablet daily except 1/2 tablet on Mondays Patient taking  differently: Take 2.5-5 mg by mouth See admin instructions. Take 1 tablet daily except 1/2 tablet on Mondays 05/24/14  Yes [name], MD  Aspirin-Acetaminophen-Caffeine (GOODY HEADACHE PO) Take 1 packet by mouth daily as needed (pain).    Historical Provider, MD    Allergies:   Allergies  Allergen Reactions  . Clonidine Derivatives   . Statins     Myalgias:  Previously on Crestor,Vytorin,Simvastatin,Lipitor & Pravastatin  . Sulfa Antibiotics Swelling    Social History:  reports that he has quit smoking. He does not have any smokeless tobacco history on file. He reports that he does not drink alcohol or use illicit drugs.  Family History: Family History  Problem Relation Age of Onset  . Stroke Mother 51  . Stroke Father 67  . Heart attack Father 5  . Heart attack Brother 18  . Cancer Sister 39    Physical Exam: Filed Vitals:   12/26/14 1811 12/26/14 1915 12/26/14 2000 12/26/14 2151  BP: 189/97 177/104 171/97 168/87  Pulse: 83 97 92 98  Temp: 97.8 F (36.6 C) 97.8 F (36.6 C)  98.4 F (36.9 C)  TempSrc: Oral Oral  Oral  Resp: 16 16  18   Height:      Weight:      SpO2: 96% 98% 95% 95%   General appearance: alert, cooperative and no distress Head: Normocephalic, without obvious abnormality, atraumatic Eyes: negative Nose: Nares normal. Septum midline. Mucosa normal. No drainage or sinus tenderness. Neck: no JVD and  supple, symmetrical, trachea midline Lungs: clear to auscultation bilaterally Heart: regular rate and rhythm, S1, S2 normal, no murmur, click, rub or gallop Abdomen: soft, ttp epi, dec bs.  no r/g nonacute Extremities: extremities normal, atraumatic, no cyanosis or edema Pulses: 2+ and symmetric Skin: Skin color, texture, turgor normal. No rashes or lesions Neurologic: Grossly normal    Labs on Admission:   Recent Labs  12/26/14 1526  NA 138  K 3.8  CL 106  CO2 24  GLUCOSE 121*  BUN 25*  CREATININE 1.55*  CALCIUM 9.3    Recent Labs   12/26/14 1526  AST 19  ALT 13  ALKPHOS 64  BILITOT 0.8  PROT 7.3  ALBUMIN 4.5    Recent Labs  12/26/14 1526  LIPASE 768*    Recent Labs  12/26/14 1526  WBC 15.3*  NEUTROABS 13.1*  HGB 18.0*  HCT 51.8  MCV 85.6  PLT 157    Recent Labs  12/26/14 1526  TROPONINI <0.03   Radiological Exams on Admission: Ct Abdomen Pelvis Wo Contrast  12/26/2014   CLINICAL DATA:  Nausea and vomiting. Decreased bowel sounds. Upper abdominal pain. Distention and shortness of breath. No bowel movement today.  EXAM: CT ABDOMEN AND PELVIS WITHOUT CONTRAST  TECHNIQUE: Multidetector CT imaging of the abdomen and pelvis was performed following the standard protocol without IV contrast.  COMPARISON:  06/18/2014  FINDINGS: Atelectasis in the lung bases. Mild cardiac enlargement. Coronary artery and aortic valve calcification. Postoperative changes in the mediastinum.  Evaluation of solid organs and vascular structures is limited without IV contrast material. Low-attenuation lesions are demonstrated in the liver, measuring up to 18 mm diameter. These are unchanged since prior study and probably represent cysts. There is infiltration and edema demonstrated in the region of the head of the pancreas and extending down along the anterior perirenal fascia on the right and into the retroperitoneum and along the iliopsoas muscle region of the pelvis. This likely represents inflammatory process. Consider early acute pancreatitis versus retroperitoneal inflammatory process. Fat density lesion at the body of the pancreas. This is not changed since prior study. No bile duct dilatation. The unenhanced appearance of the gallbladder, spleen, adrenal glands, inferior vena cava, and retroperitoneal lymph nodes is unremarkable. Lesion in the lower pole right kidney probably representing cyst. No change since prior study. No hydronephrosis in either kidney. Calcification of abdominal aorta and branch vessels. No aneurysm. Stomach,  small bowel, and colon are not abnormally distended. No free air in the abdomen.  Pelvis: Prostate gland is enlarged. Bladder wall is not thickened. No pelvic mass or lymphadenopathy. Appendix is normal. No free or loculated pelvic fluid collections. Degenerative changes in the spine. No destructive bone lesions.  IMPRESSION: Inflammatory infiltration around the head of the pancreas, retroperitoneum, and a right ileus psoas region. This probably represents early changes of acute pancreatitis although duodenitis or other retroperitoneal inflammatory process could also have this appearance. Unchanged appearance of hepatic and renal cysts. 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   Electronically Signed   By: Lucienne Capers M.D.   On: 12/26/2014 20:45   Dg Chest 2 View  12/26/2014   CLINICAL DATA:  Chest pain, lower abdominal pain, shortness of breath.  EXAM: CHEST  2 VIEW  COMPARISON:  08/19/2010  FINDINGS: Prior CABG. Bibasilar linear and platelike densities, most likely atelectasis. Heart is normal size. No effusions. No acute bony abnormality.  IMPRESSION: Bibasilar opacities, likely atelectasis.   Electronically Signed  By: Rolm Baptise M.D.   On: 12/26/2014 15:04    Assessment/Plan  80 yo male with acute pancreatitis  Principal Problem:   Acute pancreatitis-  Conservative treatment.  May be gallbladder related, check ultrasound in am.  Keep npo, ivf.  Gi consult in am.  Ck trig level.  Hctz??  Active Problems:   Dehydration-  ivf   S/P CABG x 4: in 2007 (LIMA to LAD, SVG to diagonal, SVG to ramus intermediate, SVG to distal RCA)- stable   Atrial fibrillation with controlled ventricular response (NEW)-  Rate controlled   Long term current use of anticoagulant therapy-  Hold coumadin and asa in case need for surgery in next day or so   Essential hypertension-  Prn metoprolol iv ordered   Abdominal pain, acute, epigastric-  As above   Renal insufficiency, mild-   Prerenal, ivf, monitor cr.  Admit to medical.  Full code.  Ashanti Ratti A 12/26/2014, 9:59 PM

## 2014-12-26 NOTE — ED Notes (Signed)
Pt c/o chest pain and lower abd pain, onset this am at 0100. Pt also reports SOB.

## 2014-12-27 ENCOUNTER — Inpatient Hospital Stay (HOSPITAL_COMMUNITY): Payer: Medicare Other

## 2014-12-27 ENCOUNTER — Encounter (HOSPITAL_COMMUNITY): Payer: Self-pay | Admitting: *Deleted

## 2014-12-27 DIAGNOSIS — K859 Acute pancreatitis, unspecified: Principal | ICD-10-CM

## 2014-12-27 DIAGNOSIS — R1031 Right lower quadrant pain: Secondary | ICD-10-CM

## 2014-12-27 DIAGNOSIS — R52 Pain, unspecified: Secondary | ICD-10-CM

## 2014-12-27 LAB — COMPREHENSIVE METABOLIC PANEL
ALK PHOS: 56 U/L (ref 39–117)
ALT: 13 U/L (ref 0–53)
ANION GAP: 8 (ref 5–15)
AST: 17 U/L (ref 0–37)
Albumin: 3.8 g/dL (ref 3.5–5.2)
BILIRUBIN TOTAL: 1.3 mg/dL — AB (ref 0.3–1.2)
BUN: 22 mg/dL (ref 6–23)
CALCIUM: 8.2 mg/dL — AB (ref 8.4–10.5)
CHLORIDE: 106 mmol/L (ref 96–112)
CO2: 25 mmol/L (ref 19–32)
Creatinine, Ser: 1.42 mg/dL — ABNORMAL HIGH (ref 0.50–1.35)
GFR calc Af Amer: 53 mL/min — ABNORMAL LOW (ref >90)
GFR calc non Af Amer: 46 mL/min — ABNORMAL LOW (ref >90)
Glucose, Bld: 108 mg/dL — ABNORMAL HIGH (ref 70–99)
Potassium: 4.1 mmol/L (ref 3.5–5.1)
Sodium: 139 mmol/L (ref 135–145)
TOTAL PROTEIN: 6.6 g/dL (ref 6.0–8.3)

## 2014-12-27 LAB — URINALYSIS, ROUTINE W REFLEX MICROSCOPIC
Bilirubin Urine: NEGATIVE
Glucose, UA: NEGATIVE mg/dL
KETONES UR: NEGATIVE mg/dL
Leukocytes, UA: NEGATIVE
Nitrite: NEGATIVE
PH: 6 (ref 5.0–8.0)
Specific Gravity, Urine: 1.025 (ref 1.005–1.030)
Urobilinogen, UA: 0.2 mg/dL (ref 0.0–1.0)

## 2014-12-27 LAB — CBC
HCT: 49.4 % (ref 39.0–52.0)
Hemoglobin: 16.6 g/dL (ref 13.0–17.0)
MCH: 29.3 pg (ref 26.0–34.0)
MCHC: 33.6 g/dL (ref 30.0–36.0)
MCV: 87.1 fL (ref 78.0–100.0)
PLATELETS: 158 10*3/uL (ref 150–400)
RBC: 5.67 MIL/uL (ref 4.22–5.81)
RDW: 14 % (ref 11.5–15.5)
WBC: 15.9 10*3/uL — ABNORMAL HIGH (ref 4.0–10.5)

## 2014-12-27 LAB — URINE MICROSCOPIC-ADD ON

## 2014-12-27 LAB — PROTIME-INR
INR: 2.7 — ABNORMAL HIGH (ref 0.00–1.49)
Prothrombin Time: 28.9 seconds — ABNORMAL HIGH (ref 11.6–15.2)

## 2014-12-27 LAB — LIPASE, BLOOD: Lipase: 262 U/L — ABNORMAL HIGH (ref 11–59)

## 2014-12-27 MED ORDER — SODIUM CHLORIDE 0.9 % IV SOLN
INTRAVENOUS | Status: DC
  Administered 2014-12-27 – 2014-12-29 (×4): via INTRAVENOUS

## 2014-12-27 MED ORDER — GI COCKTAIL ~~LOC~~
30.0000 mL | Freq: Once | ORAL | Status: AC
  Administered 2014-12-27: 30 mL via ORAL
  Filled 2014-12-27: qty 30

## 2014-12-27 MED ORDER — WARFARIN SODIUM 5 MG PO TABS
5.0000 mg | ORAL_TABLET | Freq: Once | ORAL | Status: AC
  Administered 2014-12-27: 5 mg via ORAL
  Filled 2014-12-27: qty 1

## 2014-12-27 MED ORDER — WARFARIN - PHARMACIST DOSING INPATIENT: Status: DC

## 2014-12-27 NOTE — Progress Notes (Signed)
Patient complaint of heart burn. Paged on-call MD to make them aware, will follow any new orders given.

## 2014-12-27 NOTE — Progress Notes (Signed)
[facility] PROGRESS NOTE  NADIM MALIA KKX:[id number] DOB: [birth year] DOA: 12/26/2014 PCP: Leonides Grills, MD  Assessment/Plan:  Acute pancreatitis: verified CT.  non drinker, triglyceride elevated and he does take HCTZ. Lipase trending downward. Pain improved. Await abdominal US. Provide clear liquid Active Problems:  Renal insufficiency, mild: no hx of same. Some improvement. Urine output fair. Will continue IV fluids and hold nephrotoxins. Monitor.     Essential hypertension: controlled.  Holding AceI. Monitor    Abdominal pain, acute, epigastric: managed with pain med. Related to above    Dehydration: related to decreased po intake. Continue fairly vigorous IV fluids.     S/P CABG x 4: in 2007 (LIMA to LAD, SVG to diagonal, SVG to ramus intermediate, SVG to distal RCA): no chest pain.     Atrial fibrillation with controlled ventricular response (NEW): controlled. On coumadin. INR 2.7. Request coumadin per pharmacy  Dysuria: urinalysis with cast otherwise unremarkable. Will increase IV fluids. On flomax at home  Code Status: full Family Communication: none present Disposition Plan: home    Consultants:  GI  Procedures:  none  Antibiotics:  none  HPI/Subjective: Reports feeling "whole lot better than when i came in" pain controlled  Objective: Filed Vitals:   12/26/14 2303  BP: 202/106  Pulse: 92  Temp: 98.2 F (36.8 C)  Resp: 16    Intake/Output Summary (Last 24 hours) at 12/27/14 1055 Last data filed at 12/27/14 1021  Gross per 24 hour  Intake      0 ml  Output    100 ml  Net   -100 ml   Filed Weights   12/26/14 1448 12/26/14 2303  Weight: 96.616 kg (213 lb) 94.348 kg (208 lb)    Exam:   General:  Obese appears comfortable  Cardiovascular: irregularly irregular no mgr no LE edema  Respiratory: normal effort BS clear bilaterally no wheeze  Abdomen: obese non-distended +BS but distant  Musculoskeletal: no clubbing or cyanosis    Data Reviewed: Basic Metabolic Panel:  Recent Labs Lab 12/26/14 1526 12/27/14 0550  NA 138 139  K 3.8 4.1  CL 106 106  CO2 24 25  GLUCOSE 121* 108*  BUN 25* 22  CREATININE 1.55* 1.42*  CALCIUM 9.3 8.2*   Liver Function Tests:  Recent Labs Lab 12/26/14 1526 12/27/14 0550  AST 19 17  ALT 13 13  ALKPHOS 64 56  BILITOT 0.8 1.3*  PROT 7.3 6.6  ALBUMIN 4.5 3.8    Recent Labs Lab 12/26/14 1526 12/27/14 0550  LIPASE 768* 262*   No results for input(s): AMMONIA in the last 168 hours. CBC:  Recent Labs Lab 12/26/14 1526 12/27/14 0550  WBC 15.3* 15.9*  NEUTROABS 13.1*  --   HGB 18.0* 16.6  HCT 51.8 49.4  MCV 85.6 87.1  PLT 157 158   Cardiac Enzymes:  Recent Labs Lab 12/26/14 1526  TROPONINI <0.03   BNP (last 3 results) No results for input(s): BNP in the last 8760 hours.  ProBNP (last 3 results) No results for input(s): PROBNP in the last 8760 hours.  CBG: No results for input(s): GLUCAP in the last 168 hours.  No results found for this or any previous visit (from the past 240 hour(s)).   Studies: Ct Abdomen Pelvis Wo Contrast  12/26/2014   CLINICAL DATA:  Nausea and vomiting. Decreased bowel sounds. Upper abdominal pain. Distention and shortness of breath. No bowel movement today.  EXAM: CT ABDOMEN AND PELVIS WITHOUT CONTRAST  TECHNIQUE: Multidetector CT imaging  of the abdomen and pelvis was performed following the standard protocol without IV contrast.  COMPARISON:  06/18/2014  FINDINGS: Atelectasis in the lung bases. Mild cardiac enlargement. Coronary artery and aortic valve calcification. Postoperative changes in the mediastinum.  Evaluation of solid organs and vascular structures is limited without IV contrast material. Low-attenuation lesions are demonstrated in the liver, measuring up to 18 mm diameter. These are unchanged since prior study and probably represent cysts. There is infiltration and edema demonstrated in the region of the head of the  pancreas and extending down along the anterior perirenal fascia on the right and into the retroperitoneum and along the iliopsoas muscle region of the pelvis. This likely represents inflammatory process. Consider early acute pancreatitis versus retroperitoneal inflammatory process. Fat density lesion at the body of the pancreas. This is not changed since prior study. No bile duct dilatation. The unenhanced appearance of the gallbladder, spleen, adrenal glands, inferior vena cava, and retroperitoneal lymph nodes is unremarkable. Lesion in the lower pole right kidney probably representing cyst. No change since prior study. No hydronephrosis in either kidney. Calcification of abdominal aorta and branch vessels. No aneurysm. Stomach, small bowel, and colon are not abnormally distended. No free air in the abdomen.  Pelvis: Prostate gland is enlarged. Bladder wall is not thickened. No pelvic mass or lymphadenopathy. Appendix is normal. No free or loculated pelvic fluid collections. Degenerative changes in the spine. No destructive bone lesions.  IMPRESSION: Inflammatory infiltration around the head of the pancreas, retroperitoneum, and a right ileus psoas region. This probably represents early changes of acute pancreatitis although duodenitis or other retroperitoneal inflammatory process could also have this appearance. Unchanged appearance of hepatic and renal cysts. 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   Electronically Signed   By: Lucienne Capers M.D.   On: 12/26/2014 20:45   Dg Chest 2 View  12/26/2014   CLINICAL DATA:  Chest pain, lower abdominal pain, shortness of breath.  EXAM: CHEST  2 VIEW  COMPARISON:  08/19/2010  FINDINGS: Prior CABG. Bibasilar linear and platelike densities, most likely atelectasis. Heart is normal size. No effusions. No acute bony abnormality.  IMPRESSION: Bibasilar opacities, likely atelectasis.   Electronically Signed   By: Rolm Baptise M.D.   On: 12/26/2014  15:04    Scheduled Meds: . sodium chloride   Intravenous STAT  . tamsulosin  0.4 mg Oral QPC supper   Continuous Infusions:        Time spent: 35 minutes    [facility] Pager [phone]. If 7PM-7AM, please contact night-coverage at [email or web address], password [email or web address] 12/27/2014, 10:55 AM  LOS: 1 day

## 2014-12-27 NOTE — Consult Note (Signed)
Reason for Consult: pancreatitis Referring Physician: Hospitalist  [name] is an 80 y.o. male.  HPI: Admitted thru the ED yesterday. C/o [employer] chest and lower abdominal pain. He thought he was having a heart attack. He tells me he had not eaten for 7 days. He had been hurting for a week. Unable to eat. He actually thought he had gas.  Pain became unbearable and he presented to the ED.  Rated pain 10/10. Denied similar type pain in the past. Says CABG was not this bad. He tried drinking milk 2 days ago but could not keep it down. No fever. Noted Lipase elevated 768 on admission. Lipase down to 262 US abdomen results are pending.  No prior hx of etoh Rates pain 8/10.    CT revealed: IMPRESSION: Inflammatory infiltration around the head of the pancreas, retroperitoneum, and a right ileus psoas region. This probably represents early changes of acute pancreatitis although duodenitis or other retroperitoneal inflammatory process could also have this appearance. Unchanged appearance of hepatic and renal cysts. 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  CBC    Component Value Date/Time   WBC 15.9* 12/27/2014 0550   RBC 5.67 12/27/2014 0550   HGB 16.6 12/27/2014 0550   HCT 49.4 12/27/2014 0550   PLT 158 12/27/2014 0550   MCV 87.1 12/27/2014 0550   MCH 29.3 12/27/2014 0550   MCHC 33.6 12/27/2014 0550   RDW 14.0 12/27/2014 0550   LYMPHSABS 1.4 12/26/2014 1526   MONOABS 0.8 12/26/2014 1526   EOSABS 0.0 12/26/2014 1526   BASOSABS 0.0 12/26/2014 1526    CMP     Component Value Date/Time   NA 139 12/27/2014 0550   K 4.1 12/27/2014 0550   CL 106 12/27/2014 0550   CO2 25 12/27/2014 0550   GLUCOSE 108* 12/27/2014 0550   BUN 22 12/27/2014 0550   CREATININE 1.42* 12/27/2014 0550   CALCIUM 8.2* 12/27/2014 0550   PROT 6.6 12/27/2014 0550   ALBUMIN 3.8 12/27/2014 0550   AST 17 12/27/2014 0550   ALT 13 12/27/2014 0550   ALKPHOS 56 12/27/2014 0550    BILITOT 1.3* 12/27/2014 0550   GFRNONAA 46* 12/27/2014 0550   GFRAA 53* 12/27/2014 0550      Past Medical History  Diagnosis Date  . Hypertension   . Coronary artery disease   . Atrial fibrillation     Past Surgical History  Procedure Laterality Date  . Coronary artery bypass graft  12/10/2005    LIMA to LAD,SVG to diagonal,SVG to ramus intermediate,SVG to distal RCA.  . Tonsillectomy    . Tee without cardioversion  05/10/2012    Procedure: TRANSESOPHAGEAL ECHOCARDIOGRAM (TEE);  Surgeon: [name], MD;  Location: [facility] ENDOSCOPY;  Service: Cardiovascular;  Laterality: N/A;  . Cardioversion  05/10/2012    Procedure: CARDIOVERSION;  Surgeon: [name], MD;  Location: [facility] ENDOSCOPY;  Service: Cardiovascular;  Laterality: N/A;  . Cardiac catheterization  10/28/2005    significant 3 vessel CAD  . Nm myocar perf wall motion  09/16/2005    evidence of diaphragmatic attenuation & subtle anterolateral ischemia.    Family History  Problem Relation Age of Onset  . Stroke Mother 63  . Stroke Father 35  . Heart attack Father 57  . Heart attack Brother 57  . Cancer Sister 43    Social History:  reports that he has quit smoking. He does not have any smokeless tobacco history on file. He reports that he does not  drink alcohol or use illicit drugs.  Allergies:  Allergies  Allergen Reactions  . Clonidine Derivatives   . Statins     Myalgias:  Previously on Crestor,Vytorin,Simvastatin,Lipitor & Pravastatin  . Sulfa Antibiotics Swelling    Medications: I have reviewed the patient's current medications.  Results for orders placed or performed during the hospital encounter of 12/26/14 (from the past 48 hour(s))  CBC with Differential     Status: Abnormal   Collection Time: 12/26/14  3:26 PM  Result Value Ref Range   WBC 15.3 (H) 4.0 - 10.5 K/uL   RBC 6.05 (H) 4.22 - 5.81 MIL/uL   Hemoglobin 18.0 (H) 13.0 - 17.0 g/dL   HCT 51.8 39.0 - 52.0 %   MCV 85.6 78.0 - 100.0 fL    MCH 29.8 26.0 - 34.0 pg   MCHC 34.7 30.0 - 36.0 g/dL   RDW 13.7 11.5 - 15.5 %   Platelets 157 150 - 400 K/uL   Neutrophils Relative % 86 (H) 43 - 77 %   Neutro Abs 13.1 (H) 1.7 - 7.7 K/uL   Lymphocytes Relative 9 (L) 12 - 46 %   Lymphs Abs 1.4 0.7 - 4.0 K/uL   Monocytes Relative 5 3 - 12 %   Monocytes Absolute 0.8 0.1 - 1.0 K/uL   Eosinophils Relative 0 0 - 5 %   Eosinophils Absolute 0.0 0.0 - 0.7 K/uL   Basophils Relative 0 0 - 1 %   Basophils Absolute 0.0 0.0 - 0.1 K/uL  Comprehensive metabolic panel     Status: Abnormal   Collection Time: 12/26/14  3:26 PM  Result Value Ref Range   Sodium 138 135 - 145 mmol/L   Potassium 3.8 3.5 - 5.1 mmol/L   Chloride 106 96 - 112 mmol/L   CO2 24 19 - 32 mmol/L   Glucose, Bld 121 (H) 70 - 99 mg/dL   BUN 25 (H) 6 - 23 mg/dL   Creatinine, Ser 1.55 (H) 0.50 - 1.35 mg/dL   Calcium 9.3 8.4 - 10.5 mg/dL   Total Protein 7.3 6.0 - 8.3 g/dL   Albumin 4.5 3.5 - 5.2 g/dL   AST 19 0 - 37 U/L   ALT 13 0 - 53 U/L   Alkaline Phosphatase 64 39 - 117 U/L   Total Bilirubin 0.8 0.3 - 1.2 mg/dL   GFR calc non Af Amer 41 (L) >90 mL/min   GFR calc Af Amer 48 (L) >90 mL/min    Comment: (NOTE) The eGFR has been calculated using the CKD EPI equation. This calculation has not been validated in all clinical situations. eGFR's persistently <90 mL/min signify possible Chronic Kidney Disease.    Anion gap 8 5 - 15  Troponin I     Status: None   Collection Time: 12/26/14  3:26 PM  Result Value Ref Range   Troponin I <0.03 <0.031 ng/mL    Comment:        NO INDICATION OF MYOCARDIAL INJURY.   Lipase, blood     Status: Abnormal   Collection Time: 12/26/14  3:26 PM  Result Value Ref Range   Lipase 768 (H) 11 - 59 U/L    Comment: RESULTS CONFIRMED BY MANUAL DILUTION  Triglycerides     Status: Abnormal   Collection Time: 12/26/14  7:00 PM  Result Value Ref Range   Triglycerides 281 (H) <150 mg/dL  Comprehensive metabolic panel     Status: Abnormal    Collection Time: 12/27/14  5:50 AM  Result Value Ref Range   Sodium 139 135 - 145 mmol/L   Potassium 4.1 3.5 - 5.1 mmol/L   Chloride 106 96 - 112 mmol/L   CO2 25 19 - 32 mmol/L   Glucose, Bld 108 (H) 70 - 99 mg/dL   BUN 22 6 - 23 mg/dL   Creatinine, Ser 1.42 (H) 0.50 - 1.35 mg/dL   Calcium 8.2 (L) 8.4 - 10.5 mg/dL   Total Protein 6.6 6.0 - 8.3 g/dL   Albumin 3.8 3.5 - 5.2 g/dL   AST 17 0 - 37 U/L   ALT 13 0 - 53 U/L   Alkaline Phosphatase 56 39 - 117 U/L   Total Bilirubin 1.3 (H) 0.3 - 1.2 mg/dL   GFR calc non Af Amer 46 (L) >90 mL/min   GFR calc Af Amer 53 (L) >90 mL/min    Comment: (NOTE) The eGFR has been calculated using the CKD EPI equation. This calculation has not been validated in all clinical situations. eGFR's persistently <90 mL/min signify possible Chronic Kidney Disease.    Anion gap 8 5 - 15  CBC     Status: Abnormal   Collection Time: 12/27/14  5:50 AM  Result Value Ref Range   WBC 15.9 (H) 4.0 - 10.5 K/uL   RBC 5.67 4.22 - 5.81 MIL/uL   Hemoglobin 16.6 13.0 - 17.0 g/dL   HCT 49.4 39.0 - 52.0 %   MCV 87.1 78.0 - 100.0 fL   MCH 29.3 26.0 - 34.0 pg   MCHC 33.6 30.0 - 36.0 g/dL   RDW 14.0 11.5 - 15.5 %   Platelets 158 150 - 400 K/uL  Protime-INR     Status: Abnormal   Collection Time: 12/27/14  5:50 AM  Result Value Ref Range   Prothrombin Time 28.9 (H) 11.6 - 15.2 seconds   INR 2.70 (H) 0.00 - 1.49  Lipase, blood     Status: Abnormal   Collection Time: 12/27/14  5:50 AM  Result Value Ref Range   Lipase 262 (H) 11 - 59 U/L  Urinalysis, Routine w reflex microscopic     Status: Abnormal   Collection Time: 12/27/14 10:20 AM  Result Value Ref Range   Color, Urine YELLOW YELLOW   APPearance CLEAR CLEAR   Specific Gravity, Urine 1.025 1.005 - 1.030   pH 6.0 5.0 - 8.0   Glucose, UA NEGATIVE NEGATIVE mg/dL   Hgb urine dipstick SMALL (A) NEGATIVE   Bilirubin Urine NEGATIVE NEGATIVE   Ketones, ur NEGATIVE NEGATIVE mg/dL   Protein, ur TRACE (A) NEGATIVE mg/dL    Urobilinogen, UA 0.2 0.0 - 1.0 mg/dL   Nitrite NEGATIVE NEGATIVE   Leukocytes, UA NEGATIVE NEGATIVE  Urine microscopic-add on     Status: Abnormal   Collection Time: 12/27/14 10:20 AM  Result Value Ref Range   Squamous Epithelial / LPF RARE RARE   RBC / HPF 0-2 <3 RBC/hpf   Casts GRANULAR CAST (A) NEGATIVE    Ct Abdomen Pelvis Wo Contrast  12/26/2014   CLINICAL DATA:  Nausea and vomiting. Decreased bowel sounds. Upper abdominal pain. Distention and shortness of breath. No bowel movement today.  EXAM: CT ABDOMEN AND PELVIS WITHOUT CONTRAST  TECHNIQUE: Multidetector CT imaging of the abdomen and pelvis was performed following the standard protocol without IV contrast.  COMPARISON:  06/18/2014  FINDINGS: Atelectasis in the lung bases. Mild cardiac enlargement. Coronary artery and aortic valve calcification. Postoperative changes in the mediastinum.  Evaluation of solid organs and vascular structures is  limited without IV contrast material. Low-attenuation lesions are demonstrated in the liver, measuring up to 18 mm diameter. These are unchanged since prior study and probably represent cysts. There is infiltration and edema demonstrated in the region of the head of the pancreas and extending down along the anterior perirenal fascia on the right and into the retroperitoneum and along the iliopsoas muscle region of the pelvis. This likely represents inflammatory process. Consider early acute pancreatitis versus retroperitoneal inflammatory process. Fat density lesion at the body of the pancreas. This is not changed since prior study. No bile duct dilatation. The unenhanced appearance of the gallbladder, spleen, adrenal glands, inferior vena cava, and retroperitoneal lymph nodes is unremarkable. Lesion in the lower pole right kidney probably representing cyst. No change since prior study. No hydronephrosis in either kidney. Calcification of abdominal aorta and branch vessels. No aneurysm. Stomach, small  bowel, and colon are not abnormally distended. No free air in the abdomen.  Pelvis: Prostate gland is enlarged. Bladder wall is not thickened. No pelvic mass or lymphadenopathy. Appendix is normal. No free or loculated pelvic fluid collections. Degenerative changes in the spine. No destructive bone lesions.  IMPRESSION: Inflammatory infiltration around the head of the pancreas, retroperitoneum, and a right ileus psoas region. This probably represents early changes of acute pancreatitis although duodenitis or other retroperitoneal inflammatory process could also have this appearance. Unchanged appearance of hepatic and renal cysts. 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   Electronically Signed   By: Lucienne Capers M.D.   On: 12/26/2014 20:45   Dg Chest 2 View  12/26/2014   CLINICAL DATA:  Chest pain, lower abdominal pain, shortness of breath.  EXAM: CHEST  2 VIEW  COMPARISON:  08/19/2010  FINDINGS: Prior CABG. Bibasilar linear and platelike densities, most likely atelectasis. Heart is normal size. No effusions. No acute bony abnormality.  IMPRESSION: Bibasilar opacities, likely atelectasis.   Electronically Signed   By: Rolm Baptise M.D.   On: 12/26/2014 15:04    ROS Blood pressure 139/68, pulse 92, temperature 99.1 F (37.3 C), temperature source Oral, resp. rate 16, height $RemoveBe'5\' 6"'wlurkYoti$  (1.676 m), weight 208 lb (94.348 kg), SpO2 94 %. Physical Exam Alert and oriented. Skin warm and dry. Oral mucosa is moist.   . Sclera anicteric, conjunctivae is pink. Thyroid not enlarged. No cervical lymphadenopathy. Lungs clear. Heart regular rate and rhythm.  Abdomen is soft. Bowel sounds are positive. No hepatomegaly. No abdominal masses felt. Diffuse abdominal  tenderness. Abdomen distended  No edema to lower extremities..  Assessment/Plan: Pancreatitis. BS+. Abdomen is distended. Lipase is coming down. Patient needs to remain NPO for now.  Will discuss with Dr. Laural Golden. Patient encouraged to ask  for pain meds when he needs it.  SETZER,TERRI W 12/27/2014, 11:52 AM    GI attending note; Patient interviewed and examined. Abdominopelvic CT and ultrasound reviewed. Briefly patient is 80 year old Caucasian male who has acute pancreatitis. He does not have cholelithiasis and he does not drink alcohol and he also does not have hypercalcemia. His triglyceride is elevated at 281 but not high enough to cause pancreatitis. He is on HCTZ which can cause pancreatitis but he has been on this drug for several years. Similarly he has been on warfarin for 10 years and therefore it is unlikely to be source of his pancreatitis.  Therefore etiology of his pancreatitis remains unclear. He does not have any symptoms to suggest peptic ulcer disease and he takes Advil no more than couple of  times a year. Patient is not ready for oral feeding. He has pancreatic lesion towards tail meeting criterion for lipoma which would appear to be an incidental finding.  Recommendations; Continue nothing by mouth status except by mouth medications. Will monitor urine output closely. May consider pancreas protocol CT once he has recovered from acute illness if renal function would allow. For repeat lab in a.m.

## 2014-12-27 NOTE — Care Management Note (Addendum)
    Page 1 of 1   12/29/2014     3:26:01 PM CARE MANAGEMENT NOTE 12/29/2014  Patient:  [name], [name]   Account Number:  [id number]  Date Initiated:  12/27/2014  Documentation initiated by:  CHILDRESS,JESSICA  Subjective/Objective Assessment:   Pt admitted with pancreatitis. Pt is form home, lives with wife. Pt is independent with ADL's, has no HH services or DME's prior to hopsitalization. Pt plans to discharge home with self care. No CM needs identified.     Action/Plan:   Anticipated DC Date:  12/30/2014   Anticipated DC Plan:  HOME/SELF CARE      DC Planning Services  CM consult      Choice offered to / List presented to:             Status of service:  Completed, signed off Medicare Important Message given?  YES (If response is "NO", the following Medicare IM given date fields will be blank) Date Medicare IM given:  12/29/2014 Medicare IM given by:  Jolene Provost Date Additional Medicare IM given:   Additional Medicare IM given by:    Discharge Disposition:  HOME/SELF CARE  Per UR Regulation:  Reviewed for med. necessity/level of care/duration of stay  If discussed at [place] of Stay Meetings, dates discussed:    Comments:  12/29/2014 [place], RN, MSN, CM 12/27/2014 [place], RN, MSN, CM

## 2014-12-27 NOTE — Clinical Documentation Improvement (Signed)
Supporting Information: Renal insufficiency, mild, pre-renal; IVFs, monitor creatinine, per 3/08 progress notes.   Labs:   Bun/  Creat/  GFR 3/09:  22/  1.42/  46 3/08:  25/  1.55/  41    . Document the stage of CKD --Chronic kidney disease, stage 1- GFR > OR = 90 --Chronic kidney disease, stage 2 (mild) - GFR 60-89 --Chronic kidney disease, stage 3 (moderate) - GFR 30-59 --Chronic kidney disease, stage 4 (severe) - GFR 15-29 --Chronic kidney disease, stage 5- GFR < 15 --End-stage renal disease (ESRD) . Document any underlying cause of CKD such as Diabetes or Hypertension . Document if the patient is dependent on Dialysis . Chronic renal failure without a documented stage will be assigned to Chronic kidney disease, unspecified . Document any associated diagnoses/conditions     Thank Sherian Maroon Documentation Specialist [phone] Takera [email or web address]

## 2014-12-27 NOTE — Care Management Utilization Note (Signed)
UR completed 

## 2014-12-27 NOTE — Progress Notes (Signed)
ANTICOAGULATION CONSULT NOTE - Initial Consult  Pharmacy Consult for Coumadin (chronic Rx PTA) Indication: atrial fibrillation  Allergies  Allergen Reactions  . Clonidine Derivatives   . Statins     Myalgias:  Previously on Crestor,Vytorin,Simvastatin,Lipitor & Pravastatin  . Sulfa Antibiotics Swelling   Patient Measurements: Height: 5\' 6"  (167.6 cm) Weight: 208 lb (94.348 kg) IBW/kg (Calculated) : 63.8  Vital Signs: Temp: 99.1 F (37.3 C) (03/09 1105) Temp Source: Oral (03/09 1105) BP: 139/68 mmHg (03/09 1105) Pulse Rate: 92 (03/09 1105)  Labs:  Recent Labs  12/26/14 1526 12/27/14 0550  HGB 18.0* 16.6  HCT 51.8 49.4  PLT 157 158  LABPROT  --  28.9*  INR  --  2.70*  CREATININE 1.55* 1.42*  TROPONINI <0.03  --    Estimated Creatinine Clearance: 46.1 mL/min (by C-G formula based on Cr of 1.42).  Medical History: Past Medical History  Diagnosis Date  . Hypertension   . Coronary artery disease   . Atrial fibrillation    Medications:  Prescriptions prior to admission  Medication Sig Dispense Refill Last Dose  . aspirin EC 81 MG tablet Take 81 mg by mouth every morning.   12/26/2014 at Unknown time  . lisinopril-hydrochlorothiazide (PRINZIDE,ZESTORETIC) 20-25 MG per tablet Take 0.5 tablets by mouth daily.    12/26/2014 at Unknown time  . Tamsulosin HCl (FLOMAX) 0.4 MG CAPS Take 0.4 mg by mouth daily after supper.   12/25/2014 at Unknown time  . warfarin (COUMADIN) 5 MG tablet Take 1 tablet daily except 1/2 tablet on Mondays (Patient taking differently: Take 2.5-5 mg by mouth See admin instructions. Take 1 tablet daily except 1/2 tablet on Mondays) 90 tablet 3 12/25/2014 at Unknown time  . Aspirin-Acetaminophen-Caffeine (GOODY HEADACHE PO) Take 1 packet by mouth daily as needed (pain).   06/18/2014 at Unknown time    Assessment: 80yo male on chronic Coumadin PTA.  Home dose listed above.  INR is therapeutic on admission.  Previous anticoag records reviewed.  Goal of  Therapy:  INR 2-3 Monitor platelets by anticoagulation protocol: Yes   Plan:  Coumadin 5mg  po today x 1 (home dose) INR daily for now  Hart Robinsons A 12/27/2014,11:14 AM

## 2014-12-28 DIAGNOSIS — K858 Other acute pancreatitis: Secondary | ICD-10-CM

## 2014-12-28 LAB — BASIC METABOLIC PANEL
ANION GAP: 7 (ref 5–15)
BUN: 21 mg/dL (ref 6–23)
CO2: 23 mmol/L (ref 19–32)
Calcium: 7.8 mg/dL — ABNORMAL LOW (ref 8.4–10.5)
Chloride: 107 mmol/L (ref 96–112)
Creatinine, Ser: 1.33 mg/dL (ref 0.50–1.35)
GFR calc Af Amer: 57 mL/min — ABNORMAL LOW (ref >90)
GFR calc non Af Amer: 50 mL/min — ABNORMAL LOW (ref >90)
GLUCOSE: 102 mg/dL — AB (ref 70–99)
POTASSIUM: 3.5 mmol/L (ref 3.5–5.1)
Sodium: 137 mmol/L (ref 135–145)

## 2014-12-28 LAB — URINE CULTURE

## 2014-12-28 LAB — CBC
HCT: 45.2 % (ref 39.0–52.0)
HEMOGLOBIN: 14.8 g/dL (ref 13.0–17.0)
MCH: 28.7 pg (ref 26.0–34.0)
MCHC: 32.7 g/dL (ref 30.0–36.0)
MCV: 87.8 fL (ref 78.0–100.0)
PLATELETS: 124 10*3/uL — AB (ref 150–400)
RBC: 5.15 MIL/uL (ref 4.22–5.81)
RDW: 14 % (ref 11.5–15.5)
WBC: 13.6 10*3/uL — ABNORMAL HIGH (ref 4.0–10.5)

## 2014-12-28 LAB — PROTIME-INR
INR: 2.45 — AB (ref 0.00–1.49)
Prothrombin Time: 26.8 seconds — ABNORMAL HIGH (ref 11.6–15.2)

## 2014-12-28 LAB — LIPASE, BLOOD: Lipase: 149 U/L — ABNORMAL HIGH (ref 11–59)

## 2014-12-28 MED ORDER — SIMETHICONE 80 MG PO CHEW
80.0000 mg | CHEWABLE_TABLET | Freq: Four times a day (QID) | ORAL | Status: DC | PRN
  Administered 2014-12-28 – 2014-12-29 (×2): 80 mg via ORAL
  Filled 2014-12-28 (×2): qty 1

## 2014-12-28 MED ORDER — WARFARIN SODIUM 5 MG PO TABS
5.0000 mg | ORAL_TABLET | Freq: Once | ORAL | Status: AC
  Administered 2014-12-28: 5 mg via ORAL
  Filled 2014-12-28: qty 1

## 2014-12-28 MED ORDER — POTASSIUM CHLORIDE CRYS ER 20 MEQ PO TBCR
20.0000 meq | EXTENDED_RELEASE_TABLET | Freq: Every day | ORAL | Status: DC
  Administered 2014-12-28 – 2014-12-29 (×2): 20 meq via ORAL
  Filled 2014-12-28 (×2): qty 1

## 2014-12-28 MED ORDER — FLEET ENEMA 7-19 GM/118ML RE ENEM: 1.0000 | ENEMA | Freq: Every day | RECTAL | Status: DC | PRN

## 2014-12-28 MED ORDER — HYDROMORPHONE HCL 1 MG/ML IJ SOLN
1.0000 mg | INTRAMUSCULAR | Status: DC | PRN
  Administered 2014-12-28 – 2014-12-29 (×3): 1 mg via INTRAVENOUS
  Filled 2014-12-28 (×3): qty 1

## 2014-12-28 MED ORDER — FLEET ENEMA 7-19 GM/118ML RE ENEM
1.0000 | ENEMA | Freq: Once | RECTAL | Status: AC
  Administered 2014-12-28: 1 via RECTAL

## 2014-12-28 NOTE — Progress Notes (Addendum)
Patient ID: [name], male   DOB: Dec 03, 1934, 80 y.o.   MRN: [id number] Feels better today. Rates pain 5-6/10. Slept well last night. C/o lower abdominal pain.  BUN/creatine normal. K. 3.5. No BM x 10 days. Is passing flatus.   CBC Latest Ref Rng 12/28/2014 12/27/2014 12/26/2014  WBC 4.0 - 10.5 K/uL 13.6(H) 15.9(H) 15.3(H)  Hemoglobin 13.0 - 17.0 g/dL 14.8 16.6 18.0(H)  Hematocrit 39.0 - 52.0 % 45.2 49.4 51.8  Platelets 150 - 400 K/uL 124(L) 158 157    I/O last 3 completed shifts: In: 2433.3 [P.O.:465; I.V.:1968.3] Out: 900 [Urine:900]   BMET    Component Value Date/Time   NA 137 12/28/2014 0541   K 3.5 12/28/2014 0541   CL 107 12/28/2014 0541   CO2 23 12/28/2014 0541   GLUCOSE 102* 12/28/2014 0541   BUN 21 12/28/2014 0541   CREATININE 1.33 12/28/2014 0541   CALCIUM 7.8* 12/28/2014 0541   GFRNONAA 50* 12/28/2014 0541   GFRAA 57* 12/28/2014 0541   Pancreatitis.  abdomen continues to be distended. BS+. No BM in 10 days. Is passing flatus. Fleets enema x 1. Will add potassium 11meq daily. Continue NPO status. Will continue to monitor.   GI attending note; Patient feels much better; he passed large amount of flatus and some stool with Fleet enema and Dr. Deniece Ree has ordered another one to be given this evening On exam he has protuberant abdomen with normal bowel sounds and mild midepigastric tenderness. Agree with starting him in on clear liquids. Will check LFTs in a.m.

## 2014-12-28 NOTE — Progress Notes (Signed)
Patient ambulated around the unit x2 with standby assistance.  Patient tolerated ambulation well and is independent.  Patient told he can ambulate around unit on his own, without assistance, asked not to leave unit.

## 2014-12-28 NOTE — Progress Notes (Signed)
ANTICOAGULATION CONSULT NOTE - follow up  Pharmacy Consult for Coumadin (chronic Rx PTA) Indication: atrial fibrillation  Allergies  Allergen Reactions  . Clonidine Derivatives   . Statins     Myalgias:  Previously on Crestor,Vytorin,Simvastatin,Lipitor & Pravastatin  . Sulfa Antibiotics Swelling   Patient Measurements: Height: 5\' 6"  (167.6 cm) Weight: 220 lb 7.4 oz (100 kg) IBW/kg (Calculated) : 63.8  Vital Signs: Temp: 98.9 F (37.2 C) (03/10 0444) Temp Source: Oral (03/10 0444) BP: 124/54 mmHg (03/10 0444) Pulse Rate: 93 (03/10 0444)  Labs:  Recent Labs  12/26/14 1526 12/27/14 0550 12/28/14 0541  HGB 18.0* 16.6 14.8  HCT 51.8 49.4 45.2  PLT 157 158 124*  LABPROT  --  28.9* 26.8*  INR  --  2.70* 2.45*  CREATININE 1.55* 1.42* 1.33  TROPONINI <0.03  --   --    Estimated Creatinine Clearance: 50.7 mL/min (by C-G formula based on Cr of 1.33).  Medical History: Past Medical History  Diagnosis Date  . Hypertension   . Coronary artery disease   . Atrial fibrillation    Medications:  Prescriptions prior to admission  Medication Sig Dispense Refill Last Dose  . aspirin EC 81 MG tablet Take 81 mg by mouth every morning.   12/26/2014 at Unknown time  . lisinopril-hydrochlorothiazide (PRINZIDE,ZESTORETIC) 20-25 MG per tablet Take 0.5 tablets by mouth daily.    12/26/2014 at Unknown time  . Tamsulosin HCl (FLOMAX) 0.4 MG CAPS Take 0.4 mg by mouth daily after supper.   12/25/2014 at Unknown time  . warfarin (COUMADIN) 5 MG tablet Take 1 tablet daily except 1/2 tablet on Mondays (Patient taking differently: Take 2.5-5 mg by mouth See admin instructions. Take 1 tablet daily except 1/2 tablet on Mondays) 90 tablet 3 12/25/2014 at Unknown time  . Aspirin-Acetaminophen-Caffeine (GOODY HEADACHE PO) Take 1 packet by mouth daily as needed (pain).   06/18/2014 at Unknown time   Assessment: 80yo male on chronic Coumadin PTA.  Home dose listed above.  INR is therapeutic on admission and  appears to be stable.  Previous anticoag records reviewed.  No bleeding reported.  Goal of Therapy:  INR 2-3 Monitor platelets by anticoagulation protocol: Yes   Plan:  Coumadin 5mg  po today x 1 (home dose) INR daily for now  Hart Robinsons A 12/28/2014,9:00 AM

## 2014-12-28 NOTE — Progress Notes (Signed)
[facility] PROGRESS NOTE  [name] QPY:[id number] DOB: 12-25-1934 DOA: 12/26/2014 PCP: Leonides Grills, MD  Assessment/Plan: Acute Pancreatitis -Would like to advance to clears. -Pain improved today. -He feels like he has a lot of "gas" that improved after enema was given and is requesting a repeat enema.  ARF on CKD Stage III -improving with IVF. -Last known Cr is 1.22 in 04/2012.  HTN -Fair control. -Continue current regimen.  CAD -s/p CABG. -Stable, no CP.  A Fib -Rate controlled. -Continue coumadin.  Code Status: Full Code Family Communication: Wife at bedside updated on plan of care  Disposition Plan: Home when ready   Consultants:  GI, Dr. Laural Golden   Antibiotics:  None   Subjective: Feels better after enema. Feels he has a lot of gas.  Objective: Filed Vitals:   12/28/14 0444 12/28/14 0658 12/28/14 1003 12/28/14 1438  BP: 124/54  113/54 157/71  Pulse: 93  86 97  Temp: 98.9 F (37.2 C)  98.4 F (36.9 C) 97.7 F (36.5 C)  TempSrc: Oral  Oral Oral  Resp: 20  20 20   Height:      Weight:  100 kg (220 lb 7.4 oz)    SpO2: 93%  95% 97%    Intake/Output Summary (Last 24 hours) at 12/28/14 1455 Last data filed at 12/28/14 1438  Gross per 24 hour  Intake 1968.33 ml  Output   1125 ml  Net 843.33 ml   Filed Weights   12/26/14 1448 12/26/14 2303 12/28/14 0658  Weight: 96.616 kg (213 lb) 94.348 kg (208 lb) 100 kg (220 lb 7.4 oz)    Exam:   General:  AA Ox3  Cardiovascular: RRR  Respiratory: CTA B  Abdomen: distended/+BS  Extremities: no C/C/E   Neurologic:  Non-focal  Data Reviewed: Basic Metabolic Panel:  Recent Labs Lab 12/26/14 1526 12/27/14 0550 12/28/14 0541  NA 138 139 137  K 3.8 4.1 3.5  CL 106 106 107  CO2 24 25 23   GLUCOSE 121* 108* 102*  BUN 25* 22 21  CREATININE 1.55* 1.42* 1.33  CALCIUM 9.3 8.2* 7.8*   Liver Function Tests:  Recent Labs Lab 12/26/14 1526 12/27/14 0550  AST 19 17  ALT 13  13  ALKPHOS 64 56  BILITOT 0.8 1.3*  PROT 7.3 6.6  ALBUMIN 4.5 3.8    Recent Labs Lab 12/26/14 1526 12/27/14 0550 12/28/14 0541  LIPASE 768* 262* 149*   No results for input(s): AMMONIA in the last 168 hours. CBC:  Recent Labs Lab 12/26/14 1526 12/27/14 0550 12/28/14 0541  WBC 15.3* 15.9* 13.6*  NEUTROABS 13.1*  --   --   HGB 18.0* 16.6 14.8  HCT 51.8 49.4 45.2  MCV 85.6 87.1 87.8  PLT 157 158 124*   Cardiac Enzymes:  Recent Labs Lab 12/26/14 1526  TROPONINI <0.03   BNP (last 3 results) No results for input(s): BNP in the last 8760 hours.  ProBNP (last 3 results) No results for input(s): PROBNP in the last 8760 hours.  CBG: No results for input(s): GLUCAP in the last 168 hours.  Recent Results (from the past 240 hour(s))  Culture, Urine     Status: None   Collection Time: 12/27/14  8:25 AM  Result Value Ref Range Status   Specimen Description URINE, CLEAN CATCH  Final   Special Requests NONE  Final   Colony Count   Final    6,000 COLONIES/ML Performed at [employer]  Final    INSIGNIFICANT GROWTH Performed at [facility]    Report Status 12/28/2014 FINAL  Final     Studies: Ct Abdomen Pelvis Wo Contrast  12/26/2014   CLINICAL DATA:  Nausea and vomiting. Decreased bowel sounds. Upper abdominal pain. Distention and shortness of breath. No bowel movement today.  EXAM: CT ABDOMEN AND PELVIS WITHOUT CONTRAST  TECHNIQUE: Multidetector CT imaging of the abdomen and pelvis was performed following the standard protocol without IV contrast.  COMPARISON:  06/18/2014  FINDINGS: Atelectasis in the lung bases. Mild cardiac enlargement. Coronary artery and aortic valve calcification. Postoperative changes in the mediastinum.  Evaluation of solid organs and vascular structures is limited without IV contrast material. Low-attenuation lesions are demonstrated in the liver, measuring up to 18 mm diameter. These are unchanged since prior study  and probably represent cysts. There is infiltration and edema demonstrated in the region of the head of the pancreas and extending down along the anterior perirenal fascia on the right and into the retroperitoneum and along the iliopsoas muscle region of the pelvis. This likely represents inflammatory process. Consider early acute pancreatitis versus retroperitoneal inflammatory process. Fat density lesion at the body of the pancreas. This is not changed since prior study. No bile duct dilatation. The unenhanced appearance of the gallbladder, spleen, adrenal glands, inferior vena cava, and retroperitoneal lymph nodes is unremarkable. Lesion in the lower pole right kidney probably representing cyst. No change since prior study. No hydronephrosis in either kidney. Calcification of abdominal aorta and branch vessels. No aneurysm. Stomach, small bowel, and colon are not abnormally distended. No free air in the abdomen.  Pelvis: Prostate gland is enlarged. Bladder wall is not thickened. No pelvic mass or lymphadenopathy. Appendix is normal. No free or loculated pelvic fluid collections. Degenerative changes in the spine. No destructive bone lesions.  IMPRESSION: Inflammatory infiltration around the head of the pancreas, retroperitoneum, and a right ileus psoas region. This probably represents early changes of acute pancreatitis although duodenitis or other retroperitoneal inflammatory process could also have this appearance. Unchanged appearance of hepatic and renal cysts. 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   Electronically Signed   By: Lucienne Capers M.D.   On: 12/26/2014 20:45   Dg Chest 2 View  12/26/2014   CLINICAL DATA:  Chest pain, lower abdominal pain, shortness of breath.  EXAM: CHEST  2 VIEW  COMPARISON:  08/19/2010  FINDINGS: Prior CABG. Bibasilar linear and platelike densities, most likely atelectasis. Heart is normal size. No effusions. No acute bony abnormality.  IMPRESSION:  Bibasilar opacities, likely atelectasis.   Electronically Signed   By: Rolm Baptise M.D.   On: 12/26/2014 15:04   US Abdomen Complete  12/27/2014   CLINICAL DATA:  Epigastric and mid abdominal pain for 1 week.  EXAM: ULTRASOUND ABDOMEN COMPLETE  COMPARISON:  CT 12/26/2014 and renal ultrasound 01/23/2009  FINDINGS: Gallbladder: No evidence for gallstones or wall thickening. There is a small echogenic focus along the anterior gallbladder wall that could represent a small polyp or calcification. This area measures less than 1 cm in size.  Common bile duct: Diameter: 0.5 cm.  Liver: Normal appearance of the liver parenchyma. Liver size is slightly prominent measuring up to 20.4 cm in length. There is a lobulated hypoechoic structure in the central aspect of the liver measuring up to 2.7 cm. Findings are compatible with a cyst and correspond with the recent CT findings. Main portal vein is patent with normal hepatopetal flow.  IVC: No abnormality visualized.  Pancreas: Not visualized due to bowel gas.  Spleen: Size and appearance within normal limits.  Right Kidney: Length: 11.3 cm. Mild cortical thinning without hydronephrosis. There is a small hypoechoic structure along the lateral right kidney measuring up to 0.9 cm and most compatible with a cyst.  Left Kidney: Length: 13.4 cm. Echogenicity within normal limits. No mass or hydronephrosis visualized.  Abdominal aorta: Proximal abdominal aorta measures up to 3.0 cm. The mid and distal aorta are not visualized due to bowel gas.  Other findings: None.  IMPRESSION: Small echogenic focus in the gallbladder could represent a polyp. No definite gallstones.  Evidence for a hepatic cyst and right renal cyst.  Proximal abdominal aorta is mildly dilated.   Electronically Signed   By: Markus Daft M.D.   On: 12/27/2014 17:47    Scheduled Meds: . potassium chloride  20 mEq Oral Daily  . tamsulosin  0.4 mg Oral QPC supper  . warfarin  5 mg Oral Once  . Warfarin - Pharmacist  Dosing Inpatient   Does not apply Q24H   Continuous Infusions: . sodium chloride 100 mL/hr at 12/28/14 1452    Principal Problem:   Acute pancreatitis Active Problems:   Dehydration   S/P CABG x 4: in 2007 (LIMA to LAD, SVG to diagonal, SVG to ramus intermediate, SVG to distal RCA)   Atrial fibrillation with controlled ventricular response (NEW)   Long term current use of anticoagulant therapy   Essential hypertension   Abdominal pain, acute, epigastric   Renal insufficiency, mild    Time spent: 35 minutes. Greater than 50% of this time was spent in direct contact with the patient coordinating care.    Lelon Frohlich  [facility] Pager [phone]  If 7PM-7AM, please contact night-coverage at [email or web address], password [facility] 12/28/2014, 2:55 PM  LOS: 2 days

## 2014-12-29 DIAGNOSIS — R1031 Right lower quadrant pain: Secondary | ICD-10-CM

## 2014-12-29 DIAGNOSIS — R52 Pain, unspecified: Secondary | ICD-10-CM

## 2014-12-29 DIAGNOSIS — K859 Acute pancreatitis, unspecified: Secondary | ICD-10-CM

## 2014-12-29 LAB — BASIC METABOLIC PANEL
ANION GAP: 8 (ref 5–15)
BUN: 20 mg/dL (ref 6–23)
CHLORIDE: 107 mmol/L (ref 96–112)
CO2: 23 mmol/L (ref 19–32)
CREATININE: 1.22 mg/dL (ref 0.50–1.35)
Calcium: 7.9 mg/dL — ABNORMAL LOW (ref 8.4–10.5)
GFR calc Af Amer: 64 mL/min — ABNORMAL LOW (ref >90)
GFR, EST NON AFRICAN AMERICAN: 55 mL/min — AB (ref >90)
Glucose, Bld: 131 mg/dL — ABNORMAL HIGH (ref 70–99)
Potassium: 3.8 mmol/L (ref 3.5–5.1)
Sodium: 138 mmol/L (ref 135–145)

## 2014-12-29 LAB — HEPATIC FUNCTION PANEL
ALK PHOS: 50 U/L (ref 39–117)
ALT: 15 U/L (ref 0–53)
AST: 20 U/L (ref 0–37)
Albumin: 3.3 g/dL — ABNORMAL LOW (ref 3.5–5.2)
BILIRUBIN DIRECT: 0.5 mg/dL (ref 0.0–0.5)
Indirect Bilirubin: 1 mg/dL — ABNORMAL HIGH (ref 0.3–0.9)
Total Bilirubin: 1.5 mg/dL — ABNORMAL HIGH (ref 0.3–1.2)
Total Protein: 6.1 g/dL (ref 6.0–8.3)

## 2014-12-29 LAB — PROTIME-INR
INR: 2.58 — ABNORMAL HIGH (ref 0.00–1.49)
PROTHROMBIN TIME: 27.9 s — AB (ref 11.6–15.2)

## 2014-12-29 LAB — LIPASE, BLOOD: Lipase: 76 U/L — ABNORMAL HIGH (ref 11–59)

## 2014-12-29 MED ORDER — WARFARIN SODIUM 5 MG PO TABS: 5.0000 mg | ORAL_TABLET | Freq: Once | ORAL | Status: DC

## 2014-12-29 NOTE — Progress Notes (Signed)
Pt discharged home today per Dr. Hernandez. Pt's IV site D/C'd and WDL. Pt's VSS. Pt provided with home medication list, discharge instructions and prescriptions. Verbalized understanding. Pt left floor via WC in stable condition accompanied by NT. 

## 2014-12-29 NOTE — Discharge Summary (Signed)
Physician Discharge Summary  KELVON GIANNINI ERX:[id number] DOB: Sep 06, 1935 DOA: 12/26/2014  PCP: Leonides Grills, MD  Admit date: 12/26/2014 Discharge date: 12/29/2014  Time spent: 45 minutes  Recommendations for Outpatient Follow-up:  -Will be discharged home today. -Advised to follow up with PCP in 2 weeks.   Discharge Diagnoses:  Principal Problem:   Acute pancreatitis Active Problems:   Dehydration   S/P CABG x 4: in 2007 (LIMA to LAD, SVG to diagonal, SVG to ramus intermediate, SVG to distal RCA)   Atrial fibrillation with controlled ventricular response (NEW)   Long term current use of anticoagulant therapy   Essential hypertension   Abdominal pain, acute, epigastric   Renal insufficiency, mild   Discharge Condition: Stable and improved  Filed Weights   12/26/14 2303 12/28/14 0658 12/29/14 0534  Weight: 94.348 kg (208 lb) 100 kg (220 lb 7.4 oz) 100.8 kg (222 lb 3.6 oz)    History of present illness:  80 yo male with acute onset of epigastric abd pain that started around midnight last night. Pain has been progressively worsening through the day. Several episodes of bilious vomiting. Has not eaten or drank anything all day except some milk which seem to make him feel better. No diarrhea. No fever. Still has gallbladder. No new medications.  Hospital Course:   Acute Pancreatitis -Tolerating full quid diet without issue. -Pain improved today. -Advised to downgrade diet to liquids and soft solids for the next few days.  ARF on CKD Stage III -Back to baseline with IVF. -Last known Cr is 1.22 in 04/2012.  HTN -Fair control. -Continue current regimen.  CAD -s/p CABG. -Stable, no CP.  A Fib -Rate controlled. -Continue coumadin.   Procedures:  None   Consultations:  GI, Dr. Laural Golden  Discharge Instructions  Discharge Instructions    Increase activity slowly    Complete by:  As directed             Medication List    STOP taking these  medications        GOODY HEADACHE PO      TAKE these medications        aspirin EC 81 MG tablet  Take 81 mg by mouth every morning.     lisinopril-hydrochlorothiazide 20-25 MG per tablet  Commonly known as:  PRINZIDE,ZESTORETIC  Take 0.5 tablets by mouth daily.     tamsulosin 0.4 MG Caps capsule  Commonly known as:  FLOMAX  Take 0.4 mg by mouth daily after supper.     warfarin 5 MG tablet  Commonly known as:  COUMADIN  Take 1 tablet daily except 1/2 tablet on Mondays       Allergies  Allergen Reactions  . Clonidine Derivatives   . Statins     Myalgias:  Previously on Crestor,Vytorin,Simvastatin,Lipitor & Pravastatin  . Sulfa Antibiotics Swelling       Follow-up Information    Follow up with Leonides Grills, MD. Schedule an appointment as soon as possible for a visit in 2 weeks.   Specialty:  Family Medicine   Contact information:   [address] [place] [address] [phone]        The results of significant diagnostics from this hospitalization (including imaging, microbiology, ancillary and laboratory) are listed below for reference.    Significant Diagnostic Studies: Ct Abdomen Pelvis Wo Contrast  12/26/2014   CLINICAL DATA:  Nausea and vomiting. Decreased bowel sounds. Upper abdominal pain. Distention and shortness of breath. No bowel movement today.  EXAM: CT ABDOMEN AND PELVIS WITHOUT CONTRAST  TECHNIQUE: Multidetector CT imaging of the abdomen and pelvis was performed following the standard protocol without IV contrast.  COMPARISON:  06/18/2014  FINDINGS: Atelectasis in the lung bases. Mild cardiac enlargement. Coronary artery and aortic valve calcification. Postoperative changes in the mediastinum.  Evaluation of solid organs and vascular structures is limited without IV contrast material. Low-attenuation lesions are demonstrated in the liver, measuring up to 18 mm diameter. These are unchanged since prior study and probably represent cysts. There  is infiltration and edema demonstrated in the region of the head of the pancreas and extending down along the anterior perirenal fascia on the right and into the retroperitoneum and along the iliopsoas muscle region of the pelvis. This likely represents inflammatory process. Consider early acute pancreatitis versus retroperitoneal inflammatory process. Fat density lesion at the body of the pancreas. This is not changed since prior study. No bile duct dilatation. The unenhanced appearance of the gallbladder, spleen, adrenal glands, inferior vena cava, and retroperitoneal lymph nodes is unremarkable. Lesion in the lower pole right kidney probably representing cyst. No change since prior study. No hydronephrosis in either kidney. Calcification of abdominal aorta and branch vessels. No aneurysm. Stomach, small bowel, and colon are not abnormally distended. No free air in the abdomen.  Pelvis: Prostate gland is enlarged. Bladder wall is not thickened. No pelvic mass or lymphadenopathy. Appendix is normal. No free or loculated pelvic fluid collections. Degenerative changes in the spine. No destructive bone lesions.  IMPRESSION: Inflammatory infiltration around the head of the pancreas, retroperitoneum, and a right ileus psoas region. This probably represents early changes of acute pancreatitis although duodenitis or other retroperitoneal inflammatory process could also have this appearance. Unchanged appearance of hepatic and renal cysts. 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   Electronically Signed   By: Lucienne Capers M.D.   On: 12/26/2014 20:45   Dg Chest 2 View  12/26/2014   CLINICAL DATA:  Chest pain, lower abdominal pain, shortness of breath.  EXAM: CHEST  2 VIEW  COMPARISON:  08/19/2010  FINDINGS: Prior CABG. Bibasilar linear and platelike densities, most likely atelectasis. Heart is normal size. No effusions. No acute bony abnormality.  IMPRESSION: Bibasilar opacities, likely  atelectasis.   Electronically Signed   By: Rolm Baptise M.D.   On: 12/26/2014 15:04   US Abdomen Complete  12/27/2014   CLINICAL DATA:  Epigastric and mid abdominal pain for 1 week.  EXAM: ULTRASOUND ABDOMEN COMPLETE  COMPARISON:  CT 12/26/2014 and renal ultrasound 01/23/2009  FINDINGS: Gallbladder: No evidence for gallstones or wall thickening. There is a small echogenic focus along the anterior gallbladder wall that could represent a small polyp or calcification. This area measures less than 1 cm in size.  Common bile duct: Diameter: 0.5 cm.  Liver: Normal appearance of the liver parenchyma. Liver size is slightly prominent measuring up to 20.4 cm in length. There is a lobulated hypoechoic structure in the central aspect of the liver measuring up to 2.7 cm. Findings are compatible with a cyst and correspond with the recent CT findings. Main portal vein is patent with normal hepatopetal flow.  IVC: No abnormality visualized.  Pancreas: Not visualized due to bowel gas.  Spleen: Size and appearance within normal limits.  Right Kidney: Length: 11.3 cm. Mild cortical thinning without hydronephrosis. There is a small hypoechoic structure along the lateral right kidney measuring up to 0.9 cm and most compatible with a cyst.  Left Kidney:  Length: 13.4 cm. Echogenicity within normal limits. No mass or hydronephrosis visualized.  Abdominal aorta: Proximal abdominal aorta measures up to 3.0 cm. The mid and distal aorta are not visualized due to bowel gas.  Other findings: None.  IMPRESSION: Small echogenic focus in the gallbladder could represent a polyp. No definite gallstones.  Evidence for a hepatic cyst and right renal cyst.  Proximal abdominal aorta is mildly dilated.   Electronically Signed   By: Markus Daft M.D.   On: 12/27/2014 17:47    Microbiology: Recent Results (from the past 240 hour(s))  Culture, Urine     Status: None   Collection Time: 12/27/14  8:25 AM  Result Value Ref Range Status   Specimen  Description URINE, CLEAN CATCH  Final   Special Requests NONE  Final   Colony Count   Final    6,000 COLONIES/ML Performed at [facility]    Culture   Final    INSIGNIFICANT GROWTH Performed at [facility]    Report Status 12/28/2014 FINAL  Final     Labs: Basic Metabolic Panel:  Recent Labs Lab 12/26/14 1526 12/27/14 0550 12/28/14 0541 12/29/14 0537  NA 138 139 137 138  K 3.8 4.1 3.5 3.8  CL 106 106 107 107  CO2 24 25 23 23   GLUCOSE 121* 108* 102* 131*  BUN 25* 22 21 20   CREATININE 1.55* 1.42* 1.33 1.22  CALCIUM 9.3 8.2* 7.8* 7.9*   Liver Function Tests:  Recent Labs Lab 12/26/14 1526 12/27/14 0550 12/29/14 0537  AST 19 17 20   ALT 13 13 15   ALKPHOS 64 56 50  BILITOT 0.8 1.3* 1.5*  PROT 7.3 6.6 6.1  ALBUMIN 4.5 3.8 3.3*    Recent Labs Lab 12/26/14 1526 12/27/14 0550 12/28/14 0541 12/29/14 0537  LIPASE 768* 262* 149* 76*   No results for input(s): AMMONIA in the last 168 hours. CBC:  Recent Labs Lab 12/26/14 1526 12/27/14 0550 12/28/14 0541  WBC 15.3* 15.9* 13.6*  NEUTROABS 13.1*  --   --   HGB 18.0* 16.6 14.8  HCT 51.8 49.4 45.2  MCV 85.6 87.1 87.8  PLT 157 158 124*   Cardiac Enzymes:  Recent Labs Lab 12/26/14 1526  TROPONINI <0.03   BNP: BNP (last 3 results) No results for input(s): BNP in the last 8760 hours.  ProBNP (last 3 results) No results for input(s): PROBNP in the last 8760 hours.  CBG: No results for input(s): GLUCAP in the last 168 hours.     SignedLelon Frohlich  [facility] Pager: [phone] 12/29/2014, 4:11 PM

## 2014-12-29 NOTE — Discharge Instructions (Signed)

## 2014-12-29 NOTE — Progress Notes (Signed)
ANTICOAGULATION CONSULT NOTE - follow up  Pharmacy Consult for Coumadin (chronic Rx PTA) Indication: atrial fibrillation  Allergies  Allergen Reactions  . Clonidine Derivatives   . Statins     Myalgias:  Previously on Crestor,Vytorin,Simvastatin,Lipitor & Pravastatin  . Sulfa Antibiotics Swelling   Patient Measurements: Height: 5\' 6"  (167.6 cm) Weight: 222 lb 3.6 oz (100.8 kg) IBW/kg (Calculated) : 63.8  Vital Signs: Temp: 98.5 F (36.9 C) (03/11 0534) Temp Source: Oral (03/11 0534) BP: 148/77 mmHg (03/11 0534) Pulse Rate: 98 (03/11 0534)  Labs:  Recent Labs  12/26/14 1526 12/27/14 0550 12/28/14 0541 12/29/14 0537  HGB 18.0* 16.6 14.8  --   HCT 51.8 49.4 45.2  --   PLT 157 158 124*  --   LABPROT  --  28.9* 26.8* 27.9*  INR  --  2.70* 2.45* 2.58*  CREATININE 1.55* 1.42* 1.33 1.22  TROPONINI <0.03  --   --   --    Estimated Creatinine Clearance: 55.5 mL/min (by C-G formula based on Cr of 1.22).  Medical History: Past Medical History  Diagnosis Date  . Hypertension   . Coronary artery disease   . Atrial fibrillation    Medications:  Prescriptions prior to admission  Medication Sig Dispense Refill Last Dose  . aspirin EC 81 MG tablet Take 81 mg by mouth every morning.   12/26/2014 at Unknown time  . lisinopril-hydrochlorothiazide (PRINZIDE,ZESTORETIC) 20-25 MG per tablet Take 0.5 tablets by mouth daily.    12/26/2014 at Unknown time  . Tamsulosin HCl (FLOMAX) 0.4 MG CAPS Take 0.4 mg by mouth daily after supper.   12/25/2014 at Unknown time  . warfarin (COUMADIN) 5 MG tablet Take 1 tablet daily except 1/2 tablet on Mondays (Patient taking differently: Take 2.5-5 mg by mouth See admin instructions. Take 1 tablet daily except 1/2 tablet on Mondays) 90 tablet 3 12/25/2014 at Unknown time  . Aspirin-Acetaminophen-Caffeine (GOODY HEADACHE PO) Take 1 packet by mouth daily as needed (pain).   06/18/2014 at Unknown time   Assessment: 80yo male on chronic Coumadin PTA.  Home dose  listed above.  INR is therapeutic on admission and appears to be stable.  Previous anticoag records reviewed.  No bleeding reported.  Goal of Therapy:  INR 2-3 Monitor platelets by anticoagulation protocol: Yes   Plan:  Coumadin 5mg  po today x 1 (home dose) INR daily for now  Hart Robinsons A 12/29/2014,1:04 PM

## 2014-12-29 NOTE — Progress Notes (Signed)
  Subjective:  Patient states he feels a lot better. He has mild pain. He denies nausea or vomiting. He is hungry. He's been passing flatus. He still feels bloated but not as bad. He did not experience pain with full liquid diet.   Objective: Blood pressure 148/77, pulse 98, temperature 98.5 F (36.9 C), temperature source Oral, resp. rate 20, height 5\' 6"  (1.676 m), weight 222 lb 3.6 oz (100.8 kg), SpO2 94 %. Patient is alert and in no acute distress. Abdomenis full. Bowel sounds are normal. On palpation abdomen is soft with mild midepigastric tenderness. No organomegaly or masses. No LE edema or clubbing noted.  Labs/studies Results:   Recent Labs  12/27/14 0550 12/28/14 0541  WBC 15.9* 13.6*  HGB 16.6 14.8  HCT 49.4 45.2  PLT 158 124*    BMET   Recent Labs  12/27/14 0550 12/28/14 0541 12/29/14 0537  NA 139 137 138  K 4.1 3.5 3.8  CL 106 107 107  CO2 25 23 23   GLUCOSE 108* 102* 131*  BUN 22 21 20   CREATININE 1.42* 1.33 1.22  CALCIUM 8.2* 7.8* 7.9*    LFT   Recent Labs  12/27/14 0550 12/29/14 0537  PROT 6.6 6.1  ALBUMIN 3.8 3.3*  AST 17 20  ALT 13 15  ALKPHOS 56 50  BILITOT 1.3* 1.5*  BILIDIR  --  0.5  IBILI  --  1.0*    PT/INR   Recent Labs  12/28/14 0541 12/29/14 0537  LABPROT 26.8* 27.9*  INR 2.45* 2.58*     Assessment:   Acute pancreatitis. Etiology unclear. Transaminases remain normal. Bilirubin is mildly elevated but it's primarily indirect. He is tolerating full liquid diet.  Recommendations;  Agree with discharge planning. Patient advised to continue full liquids for another 24 hours and then transition to low-fat diet. If symptoms relapse he will need further workup to consist of EGD and EUS. Will plan to see him in the office in 3-4 weeks and will to pancreas protocol CT when acute symptoms have completely resolved.

## 2015-01-10 ENCOUNTER — Ambulatory Visit (INDEPENDENT_AMBULATORY_CARE_PROVIDER_SITE_OTHER): Payer: Medicare Other | Admitting: *Deleted

## 2015-01-10 DIAGNOSIS — Z5181 Encounter for therapeutic drug level monitoring: Secondary | ICD-10-CM | POA: Diagnosis not present

## 2015-01-10 DIAGNOSIS — I4891 Unspecified atrial fibrillation: Secondary | ICD-10-CM | POA: Diagnosis not present

## 2015-01-10 LAB — POCT INR: INR: 2.4

## 2015-01-12 ENCOUNTER — Telehealth (INDEPENDENT_AMBULATORY_CARE_PROVIDER_SITE_OTHER): Payer: Self-pay | Admitting: Internal Medicine

## 2015-01-12 NOTE — Telephone Encounter (Signed)
I called to check on patient. He states he has fully recovered from bout of acute pancreatitis. He will need office visit in 4 weeks and possibly repeat CT with contrast to make sure he does not have small occult lesion not seen on unenhanced study during hospitalization.

## 2015-02-02 NOTE — Telephone Encounter (Signed)
Dr.Rehman states that the patient will need to contact us for further treatment.

## 2015-02-02 NOTE — Telephone Encounter (Signed)
[name] refused to schedule an apt at the time. He said his wife was in [place] and when she gets out, he will think about it.

## 2015-02-07 ENCOUNTER — Ambulatory Visit (INDEPENDENT_AMBULATORY_CARE_PROVIDER_SITE_OTHER): Payer: Medicare Other | Admitting: *Deleted

## 2015-02-07 DIAGNOSIS — Z5181 Encounter for therapeutic drug level monitoring: Secondary | ICD-10-CM | POA: Diagnosis not present

## 2015-02-07 DIAGNOSIS — I4891 Unspecified atrial fibrillation: Secondary | ICD-10-CM

## 2015-02-07 LAB — POCT INR: INR: 2.8

## 2015-03-18 ENCOUNTER — Encounter (HOSPITAL_COMMUNITY): Payer: Self-pay | Admitting: Emergency Medicine

## 2015-03-18 ENCOUNTER — Emergency Department (HOSPITAL_COMMUNITY): Payer: Medicare Other

## 2015-03-18 ENCOUNTER — Emergency Department (HOSPITAL_COMMUNITY)
Admission: EM | Admit: 2015-03-18 | Discharge: 2015-03-18 | Disposition: A | Discharge status: Home / Self Care | Payer: Medicare Other | Attending: Emergency Medicine | Admitting: Emergency Medicine

## 2015-03-18 DIAGNOSIS — I10 Essential (primary) hypertension: Secondary | ICD-10-CM | POA: Diagnosis not present

## 2015-03-18 DIAGNOSIS — Z951 Presence of aortocoronary bypass graft: Secondary | ICD-10-CM | POA: Insufficient documentation

## 2015-03-18 DIAGNOSIS — Y998 Other external cause status: Secondary | ICD-10-CM | POA: Diagnosis not present

## 2015-03-18 DIAGNOSIS — I2510 Atherosclerotic heart disease of native coronary artery without angina pectoris: Secondary | ICD-10-CM | POA: Diagnosis not present

## 2015-03-18 DIAGNOSIS — Z7901 Long term (current) use of anticoagulants: Secondary | ICD-10-CM | POA: Insufficient documentation

## 2015-03-18 DIAGNOSIS — Z9889 Other specified postprocedural states: Secondary | ICD-10-CM | POA: Diagnosis not present

## 2015-03-18 DIAGNOSIS — Z87891 Personal history of nicotine dependence: Secondary | ICD-10-CM | POA: Diagnosis not present

## 2015-03-18 DIAGNOSIS — T1592XA Foreign body on external eye, part unspecified, left eye, initial encounter: Secondary | ICD-10-CM | POA: Diagnosis not present

## 2015-03-18 DIAGNOSIS — Y9289 Other specified places as the place of occurrence of the external cause: Secondary | ICD-10-CM | POA: Insufficient documentation

## 2015-03-18 DIAGNOSIS — Z79899 Other long term (current) drug therapy: Secondary | ICD-10-CM | POA: Insufficient documentation

## 2015-03-18 DIAGNOSIS — Y9389 Activity, other specified: Secondary | ICD-10-CM | POA: Insufficient documentation

## 2015-03-18 DIAGNOSIS — X58XXXA Exposure to other specified factors, initial encounter: Secondary | ICD-10-CM | POA: Insufficient documentation

## 2015-03-18 DIAGNOSIS — R002 Palpitations: Secondary | ICD-10-CM | POA: Diagnosis not present

## 2015-03-18 DIAGNOSIS — S0592XA Unspecified injury of left eye and orbit, initial encounter: Secondary | ICD-10-CM | POA: Diagnosis present

## 2015-03-18 DIAGNOSIS — Z23 Encounter for immunization: Secondary | ICD-10-CM | POA: Diagnosis not present

## 2015-03-18 MED ORDER — TETRACAINE HCL 0.5 % OP SOLN
1.0000 [drp] | Freq: Once | OPHTHALMIC | Status: AC
  Administered 2015-03-18: 2 [drp] via OPHTHALMIC

## 2015-03-18 MED ORDER — TETANUS-DIPHTH-ACELL PERTUSSIS 5-2.5-18.5 LF-MCG/0.5 IM SUSP
0.5000 mL | Freq: Once | INTRAMUSCULAR | Status: AC
  Administered 2015-03-18: 0.5 mL via INTRAMUSCULAR
  Filled 2015-03-18: qty 0.5

## 2015-03-18 MED ORDER — TOBRAMYCIN 0.3 % OP SOLN
2.0000 [drp] | Freq: Once | OPHTHALMIC | Status: AC
  Administered 2015-03-18: 2 [drp] via OPHTHALMIC
  Filled 2015-03-18: qty 5

## 2015-03-18 MED ORDER — FLUORESCEIN SODIUM 1 MG OP STRP
1.0000 | ORAL_STRIP | Freq: Once | OPHTHALMIC | Status: AC
  Administered 2015-03-18: 1 via OPHTHALMIC

## 2015-03-18 MED ORDER — TETRACAINE HCL 0.5 % OP SOLN
OPHTHALMIC | Status: AC
  Administered 2015-03-18: 2 [drp] via OPHTHALMIC
  Filled 2015-03-18: qty 2

## 2015-03-18 MED ORDER — FLUORESCEIN SODIUM 1 MG OP STRP
ORAL_STRIP | OPHTHALMIC | Status: AC
  Administered 2015-03-18: 1 via OPHTHALMIC
  Filled 2015-03-18: qty 1

## 2015-03-18 NOTE — Discharge Instructions (Signed)
The foreign body to your left eye has been removed. The CT scan of the orbits is negative for any additional foreign body, and negative for any bleeding about her eye. Please use 2 drops of tobramycin to your left eye every 4 hours for the next 5 days to prevent infections. Please see your eye specialist or return to the emergency department if any changes, problems, or concerns. Eye, Foreign Body The term foreign body refers to any object near, on the surface of or in the eye that should not be there. A foreign body may be a small speck of dirt or dust, a hair or eyelash, a splinter or any object. CAUSES  Foreign bodies can get in the eye by:  Flying pieces of something that was broken or destroyed (debris).  A sudden injury (trauma) to the eye. SYMPTOMS  Symptoms depend on what the foreign body is and where it is in the eye. The most common locations are:  On the inner surface of the upper or lower eyelids or on the covering of the white part of the eye (conjunctiva). Symptoms in this location are:  Irritating and painful, especially when blinking.  Feeling like something is in the eye.  On the surface of the clear covering on the front of the eye (cornea). A corneal foreign body has symptoms that:  Are painful and irritating since the cornea is very sensitive.  Form small "rust rings" around a metallic foreign body. Metallic foreign bodies stick more firmly to the surface of the cornea.  Inside the eyeball. Infection can happen fast and can be hard to treat with antibiotics. This is an extremely dangerous situation. Foreign bodies inside the eye can threaten vision. A person may even loose their eye. Foreign bodies inside the eye may cause:  Great pain.  Immediate loss of vision. DIAGNOSIS  Foreign bodies are found during an exam by an eye specialist. Those that are on the eyelids, conjunctiva or cornea are usually (but not always) easily found. When a foreign body is inside the  eyeball, a cataract may form almost right away. This makes it hard for an ophthalmologist to find the foreign body. Special tests may be needed, including ultrasound testing, X-rays and CT scans. TREATMENT   Foreign bodies that are on the eyelids, conjunctiva or cornea are often removed easily and painlessly.  If the foreign body has caused a scratch or abrasion of the cornea, antibiotic drops, ointments and/or a tight patch called a "pressure patch" may be needed. Follow-up exams will be needed for several days until the abrasion heals.  Surgery is needed right away if the foreign body is inside the eyeball. This is a medical emergency. An antibiotic therapy will likely be given to stop an infection. HOME CARE INSTRUCTIONS  The use of eye patches is not universal. Their use varies from state to state and from caregiver to caregiver. If an eye patch was applied:  Keep the eye patch on for as long as directed by your caregiver until the follow-up appointment.  Do not remove the patch to put in medications unless instructed to do so. When replacing the patch, retape it as it was before. Follow the same procedure if the patch becomes loose.  WARNING: Do not drive or operate machinery while the eye is patched. The ability to judge distances will be impaired.  Only take over-the-counter or prescription medicines for pain, discomfort or fever as directed by the caregiver. If no eye patch was applied:  Keep the eye closed as much as possible. Do not rub the eye.  Wear dark glasses as needed to protect the eyes from bright light.  Do not wear contact lenses until the eye feels normal again, or as instructed.  Wear protective eye covering if there is a risk of eye injury. This is important when working with high speed tools.  Only take over-the-counter or prescription medicines for pain, discomfort or fever as directed by the caregiver. SEEK IMMEDIATE MEDICAL CARE IF:   Pain increases in the  eye or the vision changes.  You or your child has problems with the eye patch.  The injury to the eye appears to be getting larger.  There is discharge from the injured eye.  Swelling and/or soreness (inflammation) develops around the affected eye.  You or your child has an oral temperature above 102 F (38.9 C), not controlled by medicine.  Your baby is older than 3 months with a rectal temperature of 102 F (38.9 C) or higher.  Your baby is 18 months old or younger with a rectal temperature of 100.4 F (38 C) or higher. MAKE SURE YOU:   Understand these instructions.  Will watch your condition.  Will get help right away if you are not doing well or get worse. Document Released: 10/06/2005 Document Revised: 12/29/2011 Document Reviewed: 03/03/2013 [facility] Patient Information 2015 [place], [place]. This information is not intended to replace advice given to you by your health care provider. Make sure you discuss any questions you have with your health care provider.

## 2015-03-18 NOTE — ED Provider Notes (Signed)
CSN: [id number]     Arrival date & time 03/18/15  3086 History  This chart was scribed for non-physician practitioner Lily Kocher, PA-C working with Francine Graven, DO by Lora Havens, ED Scribe. This patient was seen in APA16A/APA16A and the patient's care was started at 11:08 AM.    Chief Complaint  Patient presents with  . Eye Injury   Patient is a 80 y.o. male presenting with eye injury. The history is provided by the patient. No language interpreter was used.  Eye Injury This is a recurrent problem. The current episode started more than 2 days ago. The problem occurs rarely. The problem has not changed since onset.Nothing aggravates the symptoms. Nothing relieves the symptoms. He has tried water for the symptoms. The treatment provided no relief.    HPI Comments: [name] is a 80 y.o. male who presents to the Emergency Department complaining of a left eye injury, acute onset three days ago. He was grinding breaks on a tractor and believes a piece of metal has gotten in his eye. He has tried to get the foreign object out by running water on his eye  but he was unsuccessful. This has happened before He is unsure when his last tetanus was. Pt is on coumadin for Atrial Fibrillation. He denies vision changes currently.  Past Medical History  Diagnosis Date  . Hypertension   . Coronary artery disease   . Atrial fibrillation    Past Surgical History  Procedure Laterality Date  . Coronary artery bypass graft  12/10/2005    LIMA to LAD,SVG to diagonal,SVG to ramus intermediate,SVG to distal RCA.  . Tonsillectomy    . Tee without cardioversion  05/10/2012    Procedure: TRANSESOPHAGEAL ECHOCARDIOGRAM (TEE);  Surgeon: [name], MD;  Location: [facility] ENDOSCOPY;  Service: Cardiovascular;  Laterality: N/A;  . Cardioversion  05/10/2012    Procedure: CARDIOVERSION;  Surgeon: [name], MD;  Location: [facility] ENDOSCOPY;  Service: Cardiovascular;  Laterality: N/A;  . Cardiac  catheterization  10/28/2005    significant 3 vessel CAD  . Nm myocar perf wall motion  09/16/2005    evidence of diaphragmatic attenuation & subtle anterolateral ischemia.   Family History  Problem Relation Age of Onset  . Stroke Mother 68  . Stroke Father 93  . Heart attack Father 68  . Heart attack Brother 12  . Cancer Sister 13   History  Substance Use Topics  . Smoking status: Former Research scientist (life sciences)  . Smokeless tobacco: Not on file  . Alcohol Use: No  Review of Systems  Eyes: Positive for pain and redness. Negative for visual disturbance.  Cardiovascular: Positive for palpitations.  All other systems reviewed and are negative.  Allergies  Clonidine derivatives; Statins; and Sulfa antibiotics  Home Medications   Prior to Admission medications   Medication Sig Start Date End Date Taking? Authorizing Provider  lisinopril-hydrochlorothiazide (PRINZIDE,ZESTORETIC) 20-25 MG per tablet Take 0.5 tablets by mouth daily.    Yes Historical Provider, MD  Tamsulosin HCl (FLOMAX) 0.4 MG CAPS Take 0.4 mg by mouth daily after supper.   Yes Historical Provider, MD  warfarin (COUMADIN) 4 MG tablet Take 4 mg by mouth daily at 6 PM.   Yes Historical Provider, MD  warfarin (COUMADIN) 5 MG tablet Take 1 tablet daily except 1/2 tablet on Mondays Patient not taking: Reported on 03/18/2015 05/24/14   [name], MD   BP 130/74 mmHg  Pulse 92  Temp(Src) 97.7 F (36.5 C) (Oral)  Resp 18  Ht '5\' 6"'$  (1.676 m)  Wt 207 lb (93.895 kg)  BMI 33.43 kg/m2  SpO2 98% Physical Exam  Constitutional: He is oriented to person, place, and time. He appears well-developed and well-nourished.  HENT:  Head: Normocephalic and atraumatic.  Eyes: EOM are normal. No scleral icterus.  Fundoscopic exam:      The right eye shows no exudate, no hemorrhage and no papilledema.       The left eye shows no exudate, no hemorrhage and no papilledema.  Slit lamp exam:      The right eye shows fluorescein uptake. The right eye  shows no foreign body and no hyphema.       The left eye shows foreign body. The left eye shows no corneal flare, no corneal ulcer and no hyphema.  No foreign body under the upper or lower eye lid. increased redness of conjunctiva  A foreign body was visualized at the 3 o'clock position of the left eye No periorbital tenderness or redness. Anterior chamber is clear.  Neck: Normal range of motion. Neck supple.  Cardiovascular: Normal rate.   Pulmonary/Chest: Effort normal.  Abdominal: Soft. Bowel sounds are normal.  Musculoskeletal: Normal range of motion. He exhibits no tenderness.  Neurological: He is alert and oriented to person, place, and time. No cranial nerve deficit. He exhibits normal muscle tone. Coordination normal.  Skin: Skin is warm and dry.  Psychiatric: He has a normal mood and affect. His behavior is normal.  Nursing note and vitals reviewed.   ED Course  FOREIGN BODY REMOVAL Date/Time: 03/20/2015 9:25 PM Performed by: Lily Kocher Authorized by: Lily Kocher Consent: Verbal consent obtained. Risks and benefits: risks, benefits and alternatives were discussed Consent given by: patient Patient understanding: patient states understanding of the procedure being performed Patient identity confirmed: arm band Time out: Immediately prior to procedure a "time out" was called to verify the correct patient, procedure, equipment, support staff and site/side marked as required. Body area: eye Location details: left cornea Local anesthetic: proparacaine drops Patient sedated: no Patient restrained: no Patient cooperative: yes Localization method: slit lamp and visualized Removal mechanism: ophthalmic burr Eye examined with fluorescein. Fluorescein uptake. Residual rust ring present. Residual rust ring removed. Dressing: antibiotic drops Depth: embedded Complexity: simple 1 objects recovered. Objects recovered: metal shaving Post-procedure assessment: foreign body  removed Patient tolerance: Patient tolerated the procedure well with no immediate complications Comments: CT orbits neg for any additional fb.    DIAGNOSTIC STUDIES: Oxygen Saturation is 98% on room air, normal by my interpretation.    COORDINATION OF CARE: 11:27 AM CT of the orbit to rule out any more foreign bodies. Pt agrees to plan.   Labs Review  Labs Reviewed - No data to display  Imaging Review No results found.   EKG Interpretation None      MDM  Vital signs were within normal limits. Pulse oximetry is 98% on room air.  Patient has a foreign body at the 3:00 position of the left eye.  Foreign body removed with sterile eye burr without problem. CT scan of the orbits reveals no other foreign bodies present. The globe is intact. No acute problems appreciated. The patient states he feels much better. I have observed the patient in the emergency department after the removal of the foreign body. There is no bleeding from the site (patient is on Coumadin).  Patient is placed on tobramycin ophthalmic drops for the next 5 days. He is to see his primary ophthalmologist, or return  to the emergency department if any changes or problems.    Final diagnoses:  None    **I personally performed the services described in this documentation, which was scribed in my presence. The recorded information has been reviewed and is accurate.Lily Kocher, PA-C 03/20/15 2130  Francine Graven, DO 03/22/15 2003

## 2015-03-18 NOTE — ED Notes (Addendum)
Pt reports he believe he got something in his eye this past Thursday while working on some brakes. Pt states eye has been bothering him since but became much more irritated last night. Eye is noted to red, pt denies any changes to vision. FB visible in left eye. Pt states he attempted to flush eye with water hose with no relief.

## 2015-03-21 ENCOUNTER — Ambulatory Visit (INDEPENDENT_AMBULATORY_CARE_PROVIDER_SITE_OTHER): Payer: Medicare Other | Admitting: *Deleted

## 2015-03-21 DIAGNOSIS — I4891 Unspecified atrial fibrillation: Secondary | ICD-10-CM | POA: Diagnosis not present

## 2015-03-21 DIAGNOSIS — Z5181 Encounter for therapeutic drug level monitoring: Secondary | ICD-10-CM

## 2015-03-21 LAB — POCT INR: INR: 3.2

## 2015-04-18 ENCOUNTER — Ambulatory Visit (INDEPENDENT_AMBULATORY_CARE_PROVIDER_SITE_OTHER): Payer: Medicare Other | Admitting: *Deleted

## 2015-04-18 DIAGNOSIS — I4891 Unspecified atrial fibrillation: Secondary | ICD-10-CM

## 2015-04-18 DIAGNOSIS — Z5181 Encounter for therapeutic drug level monitoring: Secondary | ICD-10-CM | POA: Diagnosis not present

## 2015-04-18 LAB — POCT INR: INR: 2.7

## 2015-05-23 ENCOUNTER — Ambulatory Visit (INDEPENDENT_AMBULATORY_CARE_PROVIDER_SITE_OTHER): Payer: Medicare Other | Admitting: *Deleted

## 2015-05-23 DIAGNOSIS — Z5181 Encounter for therapeutic drug level monitoring: Secondary | ICD-10-CM

## 2015-05-23 DIAGNOSIS — I4891 Unspecified atrial fibrillation: Secondary | ICD-10-CM | POA: Diagnosis not present

## 2015-05-23 LAB — POCT INR: INR: 2.5

## 2015-06-09 ENCOUNTER — Other Ambulatory Visit: Payer: Self-pay | Admitting: Cardiovascular Disease

## 2015-07-02 ENCOUNTER — Ambulatory Visit (INDEPENDENT_AMBULATORY_CARE_PROVIDER_SITE_OTHER): Payer: Medicare Other | Admitting: *Deleted

## 2015-07-02 DIAGNOSIS — Z5181 Encounter for therapeutic drug level monitoring: Secondary | ICD-10-CM | POA: Diagnosis not present

## 2015-07-02 DIAGNOSIS — I4891 Unspecified atrial fibrillation: Secondary | ICD-10-CM | POA: Diagnosis not present

## 2015-07-02 LAB — POCT INR: INR: 5.1

## 2015-07-18 ENCOUNTER — Ambulatory Visit (INDEPENDENT_AMBULATORY_CARE_PROVIDER_SITE_OTHER): Payer: Medicare Other | Admitting: *Deleted

## 2015-07-18 DIAGNOSIS — I4891 Unspecified atrial fibrillation: Secondary | ICD-10-CM

## 2015-07-18 DIAGNOSIS — Z5181 Encounter for therapeutic drug level monitoring: Secondary | ICD-10-CM | POA: Diagnosis not present

## 2015-07-18 LAB — POCT INR: INR: 2.9

## 2015-07-21 DIAGNOSIS — I4891 Unspecified atrial fibrillation: Secondary | ICD-10-CM

## 2015-07-21 HISTORY — DX: Unspecified atrial fibrillation: I48.91

## 2015-07-29 ENCOUNTER — Emergency Department (HOSPITAL_COMMUNITY): Payer: Medicare Other

## 2015-07-29 ENCOUNTER — Inpatient Hospital Stay (HOSPITAL_COMMUNITY)
Admission: EM | Admit: 2015-07-29 | Discharge: 2015-07-31 | DRG: 310 | Disposition: A | Discharge status: Home / Self Care | Payer: Medicare Other | Attending: Internal Medicine | Admitting: Internal Medicine

## 2015-07-29 ENCOUNTER — Encounter (HOSPITAL_COMMUNITY): Payer: Self-pay | Admitting: Emergency Medicine

## 2015-07-29 DIAGNOSIS — Z7982 Long term (current) use of aspirin: Secondary | ICD-10-CM | POA: Diagnosis not present

## 2015-07-29 DIAGNOSIS — J069 Acute upper respiratory infection, unspecified: Secondary | ICD-10-CM

## 2015-07-29 DIAGNOSIS — Z823 Family history of stroke: Secondary | ICD-10-CM

## 2015-07-29 DIAGNOSIS — R7989 Other specified abnormal findings of blood chemistry: Secondary | ICD-10-CM

## 2015-07-29 DIAGNOSIS — J028 Acute pharyngitis due to other specified organisms: Secondary | ICD-10-CM | POA: Diagnosis present

## 2015-07-29 DIAGNOSIS — Z8249 Family history of ischemic heart disease and other diseases of the circulatory system: Secondary | ICD-10-CM

## 2015-07-29 DIAGNOSIS — Z951 Presence of aortocoronary bypass graft: Secondary | ICD-10-CM

## 2015-07-29 DIAGNOSIS — I480 Paroxysmal atrial fibrillation: Principal | ICD-10-CM | POA: Diagnosis present

## 2015-07-29 DIAGNOSIS — I10 Essential (primary) hypertension: Secondary | ICD-10-CM | POA: Diagnosis present

## 2015-07-29 DIAGNOSIS — R600 Localized edema: Secondary | ICD-10-CM | POA: Diagnosis not present

## 2015-07-29 DIAGNOSIS — R778 Other specified abnormalities of plasma proteins: Secondary | ICD-10-CM | POA: Diagnosis present

## 2015-07-29 DIAGNOSIS — I2510 Atherosclerotic heart disease of native coronary artery without angina pectoris: Secondary | ICD-10-CM | POA: Diagnosis present

## 2015-07-29 DIAGNOSIS — I214 Non-ST elevation (NSTEMI) myocardial infarction: Secondary | ICD-10-CM

## 2015-07-29 DIAGNOSIS — I4891 Unspecified atrial fibrillation: Secondary | ICD-10-CM | POA: Diagnosis present

## 2015-07-29 DIAGNOSIS — Z7901 Long term (current) use of anticoagulants: Secondary | ICD-10-CM | POA: Diagnosis not present

## 2015-07-29 DIAGNOSIS — B9789 Other viral agents as the cause of diseases classified elsewhere: Secondary | ICD-10-CM

## 2015-07-29 DIAGNOSIS — R079 Chest pain, unspecified: Secondary | ICD-10-CM

## 2015-07-29 LAB — CBC
HCT: 49.8 % (ref 39.0–52.0)
HEMOGLOBIN: 17.6 g/dL — AB (ref 13.0–17.0)
MCH: 30.1 pg (ref 26.0–34.0)
MCHC: 35.3 g/dL (ref 30.0–36.0)
MCV: 85.1 fL (ref 78.0–100.0)
Platelets: 153 10*3/uL (ref 150–400)
RBC: 5.85 MIL/uL — ABNORMAL HIGH (ref 4.22–5.81)
RDW: 14 % (ref 11.5–15.5)
WBC: 12.8 10*3/uL — ABNORMAL HIGH (ref 4.0–10.5)

## 2015-07-29 LAB — COMPREHENSIVE METABOLIC PANEL
ALBUMIN: 4.1 g/dL (ref 3.5–5.0)
ALT: 15 U/L — ABNORMAL LOW (ref 17–63)
AST: 21 U/L (ref 15–41)
Alkaline Phosphatase: 66 U/L (ref 38–126)
Anion gap: 9 (ref 5–15)
BILIRUBIN TOTAL: 1.1 mg/dL (ref 0.3–1.2)
BUN: 21 mg/dL — AB (ref 6–20)
CO2: 22 mmol/L (ref 22–32)
Calcium: 8.7 mg/dL — ABNORMAL LOW (ref 8.9–10.3)
Chloride: 107 mmol/L (ref 101–111)
Creatinine, Ser: 1.44 mg/dL — ABNORMAL HIGH (ref 0.61–1.24)
GFR calc Af Amer: 52 mL/min — ABNORMAL LOW (ref >60)
GFR calc non Af Amer: 45 mL/min — ABNORMAL LOW (ref >60)
Glucose, Bld: 132 mg/dL — ABNORMAL HIGH (ref 65–99)
Potassium: 4.1 mmol/L (ref 3.5–5.1)
Sodium: 138 mmol/L (ref 135–145)
TOTAL PROTEIN: 7.5 g/dL (ref 6.5–8.1)

## 2015-07-29 LAB — INFLUENZA PANEL BY PCR (TYPE A & B)
H1N1FLUPCR: NOT DETECTED
Influenza A By PCR: NEGATIVE
Influenza B By PCR: NEGATIVE

## 2015-07-29 LAB — MRSA PCR SCREENING: MRSA BY PCR: NEGATIVE

## 2015-07-29 LAB — URINALYSIS, ROUTINE W REFLEX MICROSCOPIC
Bilirubin Urine: NEGATIVE
GLUCOSE, UA: NEGATIVE mg/dL
Ketones, ur: NEGATIVE mg/dL
LEUKOCYTES UA: NEGATIVE
Nitrite: NEGATIVE
PH: 5.5 (ref 5.0–8.0)
Protein, ur: NEGATIVE mg/dL
SPECIFIC GRAVITY, URINE: 1.019 (ref 1.005–1.030)
Urobilinogen, UA: 0.2 mg/dL (ref 0.0–1.0)

## 2015-07-29 LAB — CBC WITH DIFFERENTIAL/PLATELET
Basophils Absolute: 0.1 10*3/uL (ref 0.0–0.1)
Basophils Relative: 0 %
EOS ABS: 0.1 10*3/uL (ref 0.0–0.7)
Eosinophils Relative: 1 %
HEMATOCRIT: 48.5 % (ref 39.0–52.0)
HEMOGLOBIN: 16.3 g/dL (ref 13.0–17.0)
LYMPHS ABS: 1 10*3/uL (ref 0.7–4.0)
LYMPHS PCT: 8 %
MCH: 28.7 pg (ref 26.0–34.0)
MCHC: 33.6 g/dL (ref 30.0–36.0)
MCV: 85.5 fL (ref 78.0–100.0)
Monocytes Absolute: 0.9 10*3/uL (ref 0.1–1.0)
Monocytes Relative: 7 %
NEUTROS PCT: 84 %
Neutro Abs: 10.8 10*3/uL — ABNORMAL HIGH (ref 1.7–7.7)
Platelets: 140 10*3/uL — ABNORMAL LOW (ref 150–400)
RBC: 5.67 MIL/uL (ref 4.22–5.81)
RDW: 14.4 % (ref 11.5–15.5)
WBC: 12.9 10*3/uL — ABNORMAL HIGH (ref 4.0–10.5)

## 2015-07-29 LAB — PROTIME-INR
INR: 2.77 — ABNORMAL HIGH (ref 0.00–1.49)
PROTHROMBIN TIME: 28.8 s — AB (ref 11.6–15.2)

## 2015-07-29 LAB — URINE MICROSCOPIC-ADD ON

## 2015-07-29 LAB — TROPONIN I
Troponin I: 0.11 ng/mL — ABNORMAL HIGH (ref <0.031)
Troponin I: 0.19 ng/mL — ABNORMAL HIGH (ref <0.031)
Troponin I: 0.13 ng/mL — ABNORMAL HIGH (ref <0.031)

## 2015-07-29 LAB — TSH: TSH: 1.37 u[IU]/mL (ref 0.350–4.500)

## 2015-07-29 LAB — BRAIN NATRIURETIC PEPTIDE: B Natriuretic Peptide: 158 pg/mL — ABNORMAL HIGH (ref 0.0–100.0)

## 2015-07-29 LAB — LIPASE, BLOOD: LIPASE: 45 U/L (ref 22–51)

## 2015-07-29 MED ORDER — IPRATROPIUM BROMIDE 0.02 % IN SOLN
0.5000 mg | Freq: Four times a day (QID) | RESPIRATORY_TRACT | Status: DC
  Administered 2015-07-29: 0.5 mg via RESPIRATORY_TRACT
  Filled 2015-07-29: qty 2.5

## 2015-07-29 MED ORDER — ASPIRIN 81 MG PO CHEW
81.0000 mg | CHEWABLE_TABLET | Freq: Every day | ORAL | Status: DC
  Administered 2015-07-29: 81 mg via ORAL
  Filled 2015-07-29: qty 1

## 2015-07-29 MED ORDER — ALBUTEROL SULFATE (2.5 MG/3ML) 0.083% IN NEBU
INHALATION_SOLUTION | RESPIRATORY_TRACT | Status: AC
  Filled 2015-07-29: qty 3

## 2015-07-29 MED ORDER — DILTIAZEM HCL 30 MG PO TABS
30.0000 mg | ORAL_TABLET | Freq: Four times a day (QID) | ORAL | Status: DC
  Administered 2015-07-29: 30 mg via ORAL
  Filled 2015-07-29 (×2): qty 1

## 2015-07-29 MED ORDER — DM-GUAIFENESIN ER 30-600 MG PO TB12
1.0000 | ORAL_TABLET | Freq: Once | ORAL | Status: AC
  Administered 2015-07-29: 1 via ORAL
  Filled 2015-07-29: qty 1

## 2015-07-29 MED ORDER — WARFARIN - PHARMACIST DOSING INPATIENT: Freq: Every day | Status: DC

## 2015-07-29 MED ORDER — DILTIAZEM LOAD VIA INFUSION
10.0000 mg | Freq: Once | INTRAVENOUS | Status: AC
  Administered 2015-07-29: 10 mg via INTRAVENOUS
  Filled 2015-07-29: qty 10

## 2015-07-29 MED ORDER — ASPIRIN 81 MG PO CHEW
324.0000 mg | CHEWABLE_TABLET | Freq: Once | ORAL | Status: AC
  Administered 2015-07-29: 324 mg via ORAL
  Filled 2015-07-29: qty 4

## 2015-07-29 MED ORDER — FUROSEMIDE 10 MG/ML IJ SOLN
40.0000 mg | Freq: Two times a day (BID) | INTRAMUSCULAR | Status: AC
  Administered 2015-07-29: 40 mg via INTRAVENOUS
  Filled 2015-07-29: qty 4

## 2015-07-29 MED ORDER — GUAIFENESIN-DM 100-10 MG/5ML PO SYRP
5.0000 mL | ORAL_SOLUTION | ORAL | Status: DC | PRN
  Administered 2015-07-29 – 2015-07-30 (×4): 5 mL via ORAL
  Filled 2015-07-29 (×4): qty 5

## 2015-07-29 MED ORDER — NITROGLYCERIN IN D5W 200-5 MCG/ML-% IV SOLN
5.0000 ug/min | Freq: Once | INTRAVENOUS | Status: AC
Start: 2015-07-29 — End: 2015-07-29
  Administered 2015-07-29: 5 ug/min via INTRAVENOUS
  Filled 2015-07-29: qty 250

## 2015-07-29 MED ORDER — WARFARIN SODIUM 5 MG PO TABS
5.0000 mg | ORAL_TABLET | Freq: Once | ORAL | Status: AC
  Administered 2015-07-29: 5 mg via ORAL
  Filled 2015-07-29: qty 1

## 2015-07-29 MED ORDER — TAMSULOSIN HCL 0.4 MG PO CAPS
0.4000 mg | ORAL_CAPSULE | Freq: Every day | ORAL | Status: DC
  Administered 2015-07-29 – 2015-07-30 (×2): 0.4 mg via ORAL
  Filled 2015-07-29 (×2): qty 1

## 2015-07-29 MED ORDER — LISINOPRIL-HYDROCHLOROTHIAZIDE 20-25 MG PO TABS: 0.5000 | ORAL_TABLET | Freq: Every day | ORAL | Status: DC

## 2015-07-29 MED ORDER — LISINOPRIL 10 MG PO TABS: 10.0000 mg | ORAL_TABLET | Freq: Every day | ORAL | Status: DC

## 2015-07-29 MED ORDER — IPRATROPIUM-ALBUTEROL 0.5-2.5 (3) MG/3ML IN SOLN
3.0000 mL | Freq: Three times a day (TID) | RESPIRATORY_TRACT | Status: DC
  Administered 2015-07-29 – 2015-07-31 (×6): 3 mL via RESPIRATORY_TRACT
  Filled 2015-07-29 (×7): qty 3

## 2015-07-29 MED ORDER — ACETAMINOPHEN 325 MG PO TABS
650.0000 mg | ORAL_TABLET | ORAL | Status: DC | PRN
  Administered 2015-07-29: 650 mg via ORAL

## 2015-07-29 MED ORDER — ALBUTEROL SULFATE (2.5 MG/3ML) 0.083% IN NEBU
2.5000 mg | INHALATION_SOLUTION | RESPIRATORY_TRACT | Status: DC | PRN
  Administered 2015-07-29: 2.5 mg via RESPIRATORY_TRACT

## 2015-07-29 MED ORDER — DILTIAZEM HCL 60 MG PO TABS
60.0000 mg | ORAL_TABLET | Freq: Three times a day (TID) | ORAL | Status: DC
  Administered 2015-07-29 – 2015-07-30 (×4): 60 mg via ORAL
  Filled 2015-07-29 (×4): qty 1

## 2015-07-29 MED ORDER — ASPIRIN 81 MG PO TABS: 81.0000 mg | ORAL_TABLET | Freq: Every day | ORAL | Status: DC

## 2015-07-29 MED ORDER — ACETAMINOPHEN 325 MG PO TABS
650.0000 mg | ORAL_TABLET | Freq: Once | ORAL | Status: AC
  Administered 2015-07-29: 650 mg via ORAL
  Filled 2015-07-29: qty 2

## 2015-07-29 MED ORDER — HYDROCHLOROTHIAZIDE 12.5 MG PO CAPS: 12.5000 mg | ORAL_CAPSULE | Freq: Every day | ORAL | Status: DC

## 2015-07-29 MED ORDER — ONDANSETRON HCL 4 MG/2ML IJ SOLN: 4.0000 mg | Freq: Four times a day (QID) | INTRAMUSCULAR | Status: DC | PRN

## 2015-07-29 MED ORDER — DILTIAZEM HCL 100 MG IV SOLR
5.0000 mg/h | INTRAVENOUS | Status: DC
  Administered 2015-07-29 (×2): 5 mg/h via INTRAVENOUS
  Filled 2015-07-29: qty 100

## 2015-07-29 NOTE — ED Notes (Signed)
Pt states pain started 2 days ago but has remained persistent and gotten worse, so he came to the hospital.

## 2015-07-29 NOTE — H&P (Signed)
History and Physical  Patient ID: [name] MRN: [id number], DOB: 1935-02-13 Date of Encounter: 07/29/2015, 6:19 AM Primary Physician: Leonides Grills, MD Primary Cardiologist: Dr. Sallyanne Kuster  Chief Complaint: chest pain Reason for Admission: Atrial fibrillation with rapid ventricular response and elevated troponin  HPI: [name] is a 80 year old man with history of coronary artery disease status post CABG in 11/2005 (LIMA to LAD, SVG to diagonal, SVG to ramus, and SVG to RCA) paroxysmal atrial fibrillation, and hypertension, who has been transferred to the cardiology service from [facility] due to chest pain and the setting of atrial fibrillation with rapid ventricular response.  The patient reports that he has experienced a nonproductive cough for the last 2 days with accompanying orthopnea that has made it difficult for him to sleep.  He endorses chronic right leg edema , which is stable and unchanged since his saphenous vein harvest at the time of CABG.  Earlier this evening, around 9 PM while watching television, the patient began having substernal chest pressure rate with radiation to the left arm (5/10 in intensity) with accompanying chills.  He proceeded to the [facility] emergency department, where he was found to be in atrial fibrillation with rapid ventricular response.  He was started on a diltiazem infusion with gradual improvement in his heart rate.  He also required a nitroglycerin infusion for resolution of his chest pain.  At this time, [name] is asymptomatic, denying chest pain, palpitations, lightheadedness, and shortness of breath.  In addition to his aforementioned cough, he also endorses a sore throat earlier in the week.  His granddaughter was recently ill with a cold, which the patient is concerned he may have also caught.  He took an over-the-counter "sinus pill" last night and felt that this may have contributed to his palpitations.  He also noted that his blood  pressure was markedly elevated (SBP 190 mmHg) shortly after taking this cold medication.  He reports being compliant with his prescribed medications, including warfarin and lisinopril/HCTZ.  Past Medical History  Diagnosis Date  . Hypertension   . Coronary artery disease   . Atrial fibrillation ([place])      Most Recent Cardiac Studies: Transthoracic echocardiogram (05/07/12): Normal LV size and wall thickness.  LVEF 55-60% with normal diastolic function.  There is mild biatrial enlargement.  Normal RV size and function.  Mild tricuspid regurgitation noted as well as aortic sclerosis.    Left heart catheterization (03/05/11): LMCA without significant disease.  Mid LAD with 50-60% stenosis .  D1 with a 95% ostial/proximal lesion.  Ramus intermedius with a 60% mid stenosis followed by 50% lesion and an upper branch.  LCx with a 90% branch lesion.  RCA with 80% distal stenosis as well as 60% lesion at the distal bifurcation.     Surgical History:  Past Surgical History  Procedure Laterality Date  . Coronary artery bypass graft  12/10/2005    LIMA to LAD,SVG to diagonal,SVG to ramus intermediate,SVG to distal RCA.  . Tonsillectomy    . Tee without cardioversion  05/10/2012    Procedure: TRANSESOPHAGEAL ECHOCARDIOGRAM (TEE);  Surgeon: [name], MD;  Location: [facility] ENDOSCOPY;  Service: Cardiovascular;  Laterality: N/A;  . Cardioversion  05/10/2012    Procedure: CARDIOVERSION;  Surgeon: [name], MD;  Location: [facility] ENDOSCOPY;  Service: Cardiovascular;  Laterality: N/A;  . Cardiac catheterization  10/28/2005    significant 3 vessel CAD  . Nm myocar perf wall motion  09/16/2005    evidence  of diaphragmatic attenuation & subtle anterolateral ischemia.     Home Meds: Prior to Admission medications   Medication Sig Start Date Dimitri Dsouza Date Taking? Authorizing Provider  aspirin 81 MG tablet Take 81 mg by mouth daily.   Yes Historical Provider, MD  lisinopril-hydrochlorothiazide  (PRINZIDE,ZESTORETIC) 20-25 MG per tablet Take 0.5 tablets by mouth daily.    Yes Historical Provider, MD  Tamsulosin HCl (FLOMAX) 0.4 MG CAPS Take 0.4 mg by mouth daily after supper.   Yes Historical Provider, MD  warfarin (COUMADIN) 4 MG tablet Take 4 mg by mouth daily at 6 PM.   Yes Historical Provider, MD  warfarin (COUMADIN) 5 MG tablet Take 1 tablet by mouth daily or as directed by coumadin clinic.  Need MD appt for further refills 06/11/15  Yes Sanda Klein, MD    Allergies:  Allergies  Allergen Reactions  . Clonidine Derivatives   . Statins     Myalgias:  Previously on Crestor,Vytorin,Simvastatin,Lipitor & Pravastatin  . Sulfa Antibiotics Swelling    Social History   Social History  . Marital Status: Married    Spouse Name: N/A  . Number of Children: N/A  . Years of Education: N/A   Occupational History  . Not on file.   Social History Main Topics  . Smoking status: Former Research scientist (life sciences)  . Smokeless tobacco: Not on file  . Alcohol Use: No  . Drug Use: No  . Sexual Activity: Not on file   Other Topics Concern  . Not on file   Social History Narrative     Family History  Problem Relation Age of Onset  . Stroke Mother 28  . Stroke Father 52  . Heart attack Father 87  . Heart attack Brother 22  . Cancer Sister 73    Review of Systems: A 12 system review of systems was performed and was negative, except as noted in the history of present illness.  Labs:   Lab Results  Component Value Date   WBC 12.8* 07/29/2015   HGB 17.6* 07/29/2015   HCT 49.8 07/29/2015   MCV 85.1 07/29/2015   PLT 153 07/29/2015    Recent Labs Lab 07/29/15 0051  NA 138  K 4.1  CL 107  CO2 22  BUN 21*  CREATININE 1.44*  CALCIUM 8.7*  PROT 7.5  BILITOT 1.1  ALKPHOS 66  ALT 15*  AST 21  GLUCOSE 132*    Recent Labs  07/29/15 0051  TROPONINI 0.11*   BNP: 158  Lab Results  Component Value Date   TRIG 281* 12/26/2014    Radiology/Studies:  Dg Chest 2  View  07/29/2015   CLINICAL DATA:  Acute onset of generalized chest pain and nonproductive cough. Shortness of breath. Initial encounter.  EXAM: CHEST  2 VIEW  COMPARISON:  Chest radiograph performed 12/26/2014  FINDINGS: The lungs are well-aerated. Vascular congestion is noted. Mild bibasilar atelectasis is seen. There is no evidence of pleural effusion or pneumothorax.  The heart is normal in size; the patient is status post median sternotomy, with evidence of prior CABG. No acute osseous abnormalities are seen.  IMPRESSION: Vascular congestion noted.  Mild bibasilar atelectasis noted.   Electronically Signed   By: Garald Balding M.D.   On: 07/29/2015 01:36   Wt Readings from Last 3 Encounters:  07/29/15 95.89 kg (211 lb 6.4 oz)  03/18/15 93.895 kg (207 lb)  12/29/14 100.8 kg (222 lb 3.6 oz)    EKG: Atrial fibrillation with rapid ventricular response and nonspecific ST  segment changes.  Physical Exam: Blood pressure 117/75, pulse 89, temperature 97.9 F (36.6 C), temperature source Oral, resp. rate 18, height '5\' 6"'$  (1.676 m), weight 95.89 kg (211 lb 6.4 oz), SpO2 96 %. General: Well developed, well nourished, elderly man in no acute distress. Head: Normocephalic, atraumatic, sclera non-icteric, no xanthomas, nares are without discharge.  Neck: Negative for carotid bruits.  JVP approximately 8-10 cm with positive HJR. Lungs: Bilateral inspiratory and expiratory wheezes with fair air movement.  No crackles area Heart: Irregularly irregular without murmurs or rubs. Abdomen: Soft, non-tender, non-distended with normoactive bowel sounds. No hepatomegaly. No rebound/guarding. No obvious abdominal masses. Msk:  Strength and tone appear normal for age. Extremities: No clubbing or cyanosis.  1+ right and trace left pretibial edema.  Distal pedal pulses are 2+ and equal bilaterally. Neuro: Alert and oriented X 3. No focal deficit. No facial asymmetry. Moves all extremities spontaneously. Psych:   Responds to questions appropriately with a normal affect.    ASSESSMENT AND PLAN:  80 year old man with history of coronary artery disease and paroxysmal atrial fibrillation, admitted with atrial fibrillation with rapid ventricular response and accompanying chest pain in the setting of possible URI.  Atrial fibrillation with rapid ventricular response:  Patient remains in atrial fibrillation, albeit with adequate rate control on diltiazem infusion at 5 mg per hour.  He has a history of preserved LV function, though he appears mildly volume overloaded on exam.  Given his recent orthopnea and shortness of breath, we will not hydrate further at this point and try to transition him from diltiazem infusion to an oral regimen.  Patient initially made step down status but will transition to telemetry status with interval resolution of chest pain and discontinuation of nitroglycerin infusion.  Discontinue diltiazem infusion.  Initiate diltiazem 30 mg every 6 hours, which can be titrated as needed to achieve a resting heart rate less than 100-110 bpm.  Hold warfarin for time being in case invasive procedures such as coronary angiography is needed during this hospitalization.  We will plan to initiate heparin infusion when INR has fallen below 2.0.  Check TSH and transthoracic echocardiogram.  Chest pain and elevated troponin: Patient's symptoms began in the setting of atrial fibrillation with rapid ventricular response.  Given his history of coronary artery disease with remote CABG, I suspect that this reflects myocardial supply-demand mismatch (type II NSTEMI).  Nonspecific ST segment changes are evident on the patient's EKG.  However, with rate control, he is now chest pain-free despite discontinuation of nitroglycerin infusion.  Other etiologies such as pulmonary embolism is felt less likely.  Pneumonia is a consideration given recent cough and mild leukocytosis.  However, I favor a viral etiology given  patient's prodrome and lack of infiltrate on chest radiograph.  Continue to trend troponin every 6 hours until it has peaked.  Continue aspirin.  Will not initiate heparin infusion at this time given therapeutic INR (see details above).  Obtain transthoracic echocardiogram.  Defer initiation of a statin given history of intolerance.  Viral respiratory panel and repeat CBC with differential.  I will keep the patient nothing by mouth pending repeat troponin to ensure that it is not rising rapidly with the need for invasive procedure.  Hypertension: Blood pressure currently normal albeit with diltiazem infusion currently running in nitroglycerin infusion recently weaned off.  At home, patient is on lisinopril/HCTZ for management of hypertension.  Will hold lisinopril/HCTZ in the setting of titration of other agents such as diltiazem.  We'll try  to restart home regimen before discharge.  CODE STATUS: Full code  Signed, Nelva Bush MD  07/29/2015, 6:19 AM

## 2015-07-29 NOTE — Progress Notes (Signed)
        Patient Name: [name]      SUBJECTIVE: Admitted 10/8 with Fib RVR with + TnI  pk 0.19 Has had cough and sore throat   No chest pain   Hx of normal LV function and CAth 2007; TEE normal EF 2013  Coumadin held on admission   Past Medical History  Diagnosis Date  . Hypertension   . Coronary artery disease   . Atrial fibrillation (HCC)     Scheduled Meds:  Scheduled Meds: . aspirin  81 mg Oral Daily  . diltiazem  30 mg Oral 4 times per day  . ipratropium-albuterol  3 mL Nebulization TID  . tamsulosin  0.4 mg Oral QPC supper   Continuous Infusions:  acetaminophen, albuterol, guaiFENesin-dextromethorphan, ondansetron (ZOFRAN) IV    PHYSICAL EXAM Filed Vitals:   07/29/15 0514 07/29/15 0719 07/29/15 0934 07/29/15 0948  BP: 117/75 129/67    Pulse: 89 83    Temp: 97.9 F (36.6 C) 99.7 F (37.6 C)    TempSrc: Oral Oral    Resp: 18 20    Height:      Weight:      SpO2: 96% 95% 92% 94%   Well developed and nourished in no acute distress HENT normal Neck supple with JVP-flat Clear Irregularly irregular rate and rhythm, no murmurs or gallops Abd-soft with active BS No Clubbing cyanosis tr edema Skin-warm and dry A & Oriented  Grossly normal sensory and motor function  TELEMETRY: Reviewed telemetry pt in  sfib modest HR about 100 or so    No intake or output data in the 24 hours ending 07/29/15 1153  LABS: Basic Metabolic Panel:  Recent Labs Lab 07/29/15 0051  NA 138  K 4.1  CL 107  CO2 22  GLUCOSE 132*  BUN 21*  CREATININE 1.44*  CALCIUM 8.7*   Cardiac Enzymes:  Recent Labs  07/29/15 0051 07/29/15 0940  TROPONINI 0.11* 0.19*   CBC:  Recent Labs Lab 07/29/15 0051 07/29/15 0940  WBC 12.8* 12.9*  NEUTROABS  --  10.8*  HGB 17.6* 16.3  HCT 49.8 48.5  MCV 85.1 85.5  PLT 153 140*   PROTIME:  Recent Labs  07/29/15 0051  LABPROT 28.8*  INR 2.77*   Liver Function Tests:  Recent Labs  07/29/15 0051  AST 21    ALT 15*  ALKPHOS 66  BILITOT 1.1  PROT 7.5  ALBUMIN 4.1    Recent Labs  07/29/15 0051  LIPASE 45   BNP: BNP (last 3 results)  Recent Labs  07/29/15 0051  BNP 158.0*    CXR  vsas congestion and atelecatsis  ASSESSMENT AND PLAN:  Active Problems:   S/P CABG x 4: in 2007 (LIMA to LAD, SVG to diagonal, SVG to ramus intermediate, SVG to distal RCA)   Atrial fibrillation with tachycardic ventricular rate (HCC)   Viral URI with cough   Elevated troponin will d/c ASA Resume coumadin  Anticpate DCCV but would probably wait for acouple of days or week so as to allow the bronchitic process and cough to abate  Will give dose of diuretic today Ambulate  Will check flu panel  IV lasix        Signed, Virl Axe MD  07/29/2015

## 2015-07-29 NOTE — ED Provider Notes (Signed)
CSN: [id number]     Arrival date & time 07/29/15  0032 History  By signing my name below, I, [name], attest that this documentation has been prepared under the direction and in the presence of Kenneth Porter, MD started at 00:45. Electronically Signed: Meriel Cabrera, ED Scribe. 07/29/2015. 1:06 AM.   Chief Complaint  Patient presents with  . Chest Pain   The history is provided by the patient. No language interpreter was used.   HPI Comments: [name] is a 80 y.o. male, with a PMhx of HTN, CAD, and Afib, who presents to the Emergency Department complaining of constant, non-radiating, centralized chest pain that he describes as a tightness onset 2 hours ago, around 11 pm  with associated SOB and chills. Pt states it feels like his stomach is gassy and he notes a feeling like something is stuck in his throat. Also notes a non-productive cough onset 1 day ago. Pt states a history of similar CP but he is unable to say what the etiology of the CP was before. He has not taken any alleviating medication PTA. He is followed by a cardiologist in [place]. Pt lives at home with his wife. Denies diaphoresis, nausea, wheezing, new or worsening BLE edema. He describes chronic swelling of his right lower extremity because of donating a vein for his bypass surgery. Please note patient initially stated his pain started at 11 PM the night before.  PCP [name] Cardiology Dr [name]  Past Medical History  Diagnosis Date  . Hypertension   . Coronary artery disease   . Atrial fibrillation [facility])    Past Surgical History  Procedure Laterality Date  . Coronary artery bypass graft  12/10/2005    LIMA to LAD,SVG to diagonal,SVG to ramus intermediate,SVG to distal RCA.  . Tonsillectomy    . Tee without cardioversion  05/10/2012    Procedure: TRANSESOPHAGEAL ECHOCARDIOGRAM (TEE);  Surgeon: Kenneth Casino, MD;  Location: [facility] ENDOSCOPY;  Service: Cardiovascular;  Laterality: N/A;  . Cardioversion   05/10/2012    Procedure: CARDIOVERSION;  Surgeon: Kenneth Casino, MD;  Location: [facility] ENDOSCOPY;  Service: Cardiovascular;  Laterality: N/A;  . Cardiac catheterization  10/28/2005    significant 3 vessel CAD  . Nm myocar perf wall motion  09/16/2005    evidence of diaphragmatic attenuation & subtle anterolateral ischemia.   Family History  Problem Relation Age of Onset  . Stroke Mother 44  . Stroke Father 55  . Heart attack Father 77  . Heart attack Brother 72  . Cancer Sister 77   Social History  Substance Use Topics  . Smoking status: Former Research scientist (life sciences)  . Smokeless tobacco: None  . Alcohol Use: No  lives at home Lives with spouse Wears hearing aides but didn't bring to hospital  Review of Systems  Constitutional: Positive for chills. Negative for diaphoresis.  Respiratory: Positive for cough and shortness of breath. Negative for wheezing.   Cardiovascular: Positive for chest pain. Negative for leg swelling.  Gastrointestinal: Negative for nausea.  All other systems reviewed and are negative.  Allergies  Clonidine derivatives; Statins; and Sulfa antibiotics  Home Medications   Prior to Admission medications   Medication Sig Start Date End Date Taking? Authorizing Provider  lisinopril-hydrochlorothiazide (PRINZIDE,ZESTORETIC) 20-25 MG per tablet Take 0.5 tablets by mouth daily.     Historical Provider, MD  Tamsulosin HCl (FLOMAX) 0.4 MG CAPS Take 0.4 mg by mouth daily after supper.    Historical Provider, MD  warfarin (COUMADIN) 4  MG tablet Take 4 mg by mouth daily at 6 PM.    Historical Provider, MD  warfarin (COUMADIN) 5 MG tablet Take 1 tablet by mouth daily or as directed by coumadin clinic.  Need MD appt for further refills 06/11/15   Kenneth Croitoru, MD   BP 139/76 mmHg  Pulse 105  Temp(Src) 98.7 F (37.1 C) (Oral)  Resp 24  Ht '5\' 6"'$  (1.676 m)  Wt 220 lb (99.791 kg)  BMI 35.53 kg/m2  SpO2 94%  Vital signs normal except for tachycardia  Physical Exam   Constitutional: He is oriented to person, place, and time. He appears well-developed and well-nourished.  Non-toxic appearance. He does not appear ill. No distress.  HENT:  Head: Normocephalic and atraumatic.  Right Ear: External ear normal.  Left Ear: External ear normal.  Nose: Nose normal. No mucosal edema or rhinorrhea.  Mouth/Throat: Oropharynx is clear and moist and mucous membranes are normal. No dental abscesses or uvula swelling.  Eyes: Conjunctivae and EOM are normal. Pupils are equal, round, and reactive to light.  Neck: Normal range of motion and full passive range of motion without pain. Neck supple.  Cardiovascular: Normal heart sounds.  An irregular rhythm present. Tachycardia present.  Exam reveals no gallop and no friction rub.   No murmur heard. Pulmonary/Chest: Effort normal and breath sounds normal. No respiratory distress. He has no wheezes. He has no rhonchi. He has no rales. He exhibits no tenderness and no crepitus.    Area of chest pain noted  Abdominal: Soft. Normal appearance and bowel sounds are normal. He exhibits no distension. There is no tenderness. There is no rebound and no guarding.  Musculoskeletal: Normal range of motion. He exhibits no edema or tenderness.  Moves all extremities well.   Neurological: He is alert and oriented to person, place, and time. He has normal strength. No cranial nerve deficit.  Skin: Skin is warm, dry and intact. No rash noted. No erythema. No pallor.  Psychiatric: He has a normal mood and affect. His speech is normal and behavior is normal. His mood appears not anxious.  Nursing note and vitals reviewed.   ED Course  Procedures   Medications  diltiazem (CARDIZEM) 1 mg/mL load via infusion 10 mg (10 mg Intravenous Given 07/29/15 0147)    And  diltiazem (CARDIZEM) 100 mg in dextrose 5 % 100 mL (1 mg/mL) infusion (5 mg/hr Intravenous New Bag/Given 07/29/15 0149)  dextromethorphan-guaiFENesin (MUCINEX DM) 30-600 MG per 12 hr  tablet 1 tablet (not administered)  aspirin chewable tablet 324 mg (324 mg Oral Given 07/29/15 0118)  nitroGLYCERIN 50 mg in dextrose 5 % 250 mL (0.2 mg/mL) infusion (10 mcg/min Intravenous Rate/Dose Change 07/29/15 0144)  acetaminophen (TYLENOL) tablet 650 mg (650 mg Oral Given 07/29/15 0155)    DIAGNOSTIC STUDIES: Oxygen Saturation is 94% on RA, adequate by my interpretation.    COORDINATION OF CARE: 12:53 AM Discussed treatment plan with pt at bedside and pt agreed to plan. Will order cardiac workup.   01:40 Pt is on diltizem drip and nitroglycerin drip. Reports his pain is "almost gone". He is c/o headache from the NTG, given acetaminophen.   1:55 AM patient discussed with Dr. Saunders Revel cardiologist, states patient should come to [facility] since there is no cardiology coverage here until October 10. However he wants to wait for the lipase. If the lipase is elevated he would like medicine to admit. If the lipase is normal he will have him admitted to the cardiology  service with Dr. Caryl Comes as attending.  02:33 Dr End, given results of his lipase, accepts in transfer to [facility] to step-down, Dr Caryl Comes attending.   Recheck 03:00 pt HR 86 jumped up to 97 when talking to me. Informed of transfer and reason for transfer. Pt is agreeable to transfer. Pt has still been coughing, given mucinex DM.   Labs Review Results for orders placed or performed during the hospital encounter of 07/29/15  CBC  Result Value Ref Range   WBC 12.8 (H) 4.0 - 10.5 K/uL   RBC 5.85 (H) 4.22 - 5.81 MIL/uL   Hemoglobin 17.6 (H) 13.0 - 17.0 g/dL   HCT 49.8 39.0 - 52.0 %   MCV 85.1 78.0 - 100.0 fL   MCH 30.1 26.0 - 34.0 pg   MCHC 35.3 30.0 - 36.0 g/dL   RDW 14.0 11.5 - 15.5 %   Platelets 153 150 - 400 K/uL  Protime-INR - (order if Patient is taking Coumadin / Warfarin)  Result Value Ref Range   Prothrombin Time 28.8 (H) 11.6 - 15.2 seconds   INR 2.77 (H) 0.00 - 1.49  Comprehensive metabolic panel  Result Value Ref Range    Sodium 138 135 - 145 mmol/L   Potassium 4.1 3.5 - 5.1 mmol/L   Chloride 107 101 - 111 mmol/L   CO2 22 22 - 32 mmol/L   Glucose, Bld 132 (H) 65 - 99 mg/dL   BUN 21 (H) 6 - 20 mg/dL   Creatinine, Ser 1.44 (H) 0.61 - 1.24 mg/dL   Calcium 8.7 (L) 8.9 - 10.3 mg/dL   Total Protein 7.5 6.5 - 8.1 g/dL   Albumin 4.1 3.5 - 5.0 g/dL   AST 21 15 - 41 U/L   ALT 15 (L) 17 - 63 U/L   Alkaline Phosphatase 66 38 - 126 U/L   Total Bilirubin 1.1 0.3 - 1.2 mg/dL   GFR calc non Af Amer 45 (L) >60 mL/min   GFR calc Af Amer 52 (L) >60 mL/min   Anion gap 9 5 - 15  Troponin I  Result Value Ref Range   Troponin I 0.11 (H) <0.031 ng/mL  Brain natriuretic peptide  Result Value Ref Range   B Natriuretic Peptide 158.0 (H) 0.0 - 100.0 pg/mL  Lipase, blood  Result Value Ref Range   Lipase 45 22 - 51 U/L   Laboratory interpretation all normal except elevated troponin, renal insufficiency, therapeutic INR, elevated Hb     Imaging Review Dg Chest 2 View  07/29/2015   CLINICAL DATA:  Acute onset of generalized chest pain and nonproductive cough. Shortness of breath. Initial encounter.  EXAM: CHEST  2 VIEW  COMPARISON:  Chest radiograph performed 12/26/2014  FINDINGS: The lungs are well-aerated. Vascular congestion is noted. Mild bibasilar atelectasis is seen. There is no evidence of pleural effusion or pneumothorax.  The heart is normal in size; the patient is status post median sternotomy, with evidence of prior CABG. No acute osseous abnormalities are seen.  IMPRESSION: Vascular congestion noted.  Mild bibasilar atelectasis noted.   Electronically Signed   By: Garald Balding M.D.   On: 07/29/2015 01:36   I have personally reviewed and evaluated these images and lab results as part of my medical decision-making.   EKG Interpretation None      ED ECG REPORT   Date: 07/29/2015  Rate: 112  Rhythm: atrial fibrillation  QRS Axis: right  Intervals: normal  ST/T Wave abnormalities: normal  Conduction  Disutrbances:none  Narrative  Interpretation:   Old EKG Reviewed: none available  I have personally reviewed the EKG tracing and agree with the computerized printout as noted.    MDM   Final diagnoses:  NSTEMI (non-ST elevated myocardial infarction) ([place])  Atrial fibrillation with rapid ventricular response ([place])    Plan transfer to [facility] for admission   CRITICAL CARE Performed by: [name] L Total critical care time: 38 min Critical care time was exclusive of separately billable procedures and treating other patients. Critical care was necessary to treat or prevent imminent or life-threatening deterioration. Critical care was time spent personally by me on the following activities: development of treatment plan with patient and/or surrogate as well as nursing, discussions with consultants, evaluation of patient's response to treatment, examination of patient, obtaining history from patient or surrogate, ordering and performing treatments and interventions, ordering and review of laboratory studies, ordering and review of radiographic studies, pulse oximetry and re-evaluation of patient's condition.   I personally performed the services described in this documentation, which was scribed in my presence. The recorded information has been reviewed and considered.  Kenneth Porter, MD, Barbette Or, MD 07/29/15 [phone]

## 2015-07-29 NOTE — Progress Notes (Signed)
Utilization Review Completed.Donne Anon T10/06/2015

## 2015-07-29 NOTE — ED Notes (Signed)
Pain in chest 0 Pain in head decreased from a 4 to a 2

## 2015-07-29 NOTE — Progress Notes (Signed)
ANTICOAGULATION CONSULT NOTE - Initial Consult  Pharmacy Consult for warfarin Indication: atrial fibrillation  Allergies  Allergen Reactions  . Clonidine Derivatives   . Statins     Myalgias:  Previously on Crestor,Vytorin,Simvastatin,Lipitor & Pravastatin  . Sulfa Antibiotics Swelling    Patient Measurements: Height: '5\' 6"'$  (167.6 cm) Weight: 211 lb 6.4 oz (95.89 kg) IBW/kg (Calculated) : 63.8   Vital Signs: Temp: 99.7 F (37.6 C) (10/09 0719) Temp Source: Oral (10/09 0719) BP: 129/67 mmHg (10/09 0719) Pulse Rate: 83 (10/09 0719)  Labs:  Recent Labs  07/29/15 0051 07/29/15 0940  HGB 17.6* 16.3  HCT 49.8 48.5  PLT 153 140*  LABPROT 28.8*  --   INR 2.77*  --   CREATININE 1.44*  --   TROPONINI 0.11* 0.19*    Estimated Creatinine Clearance: 45.8 mL/min (by C-G formula based on Cr of 1.44).   Medical History: Past Medical History  Diagnosis Date  . Hypertension   . Coronary artery disease   . Atrial fibrillation (HCC)     Medications:  Prescriptions prior to admission  Medication Sig Dispense Refill Last Dose  . aspirin 81 MG tablet Take 81 mg by mouth daily.   07/28/2015 at Unknown time  . lisinopril-hydrochlorothiazide (PRINZIDE,ZESTORETIC) 20-25 MG per tablet Take 0.5 tablets by mouth daily.    07/28/2015 at Unknown time  . Tamsulosin HCl (FLOMAX) 0.4 MG CAPS Take 0.4 mg by mouth daily after supper.   07/28/2015 at Unknown time  . warfarin (COUMADIN) 4 MG tablet Take 4 mg by mouth daily at 6 PM.   07/28/2015 at Unknown time  . warfarin (COUMADIN) 5 MG tablet Take 1 tablet by mouth daily or as directed by coumadin clinic.  Need MD appt for further refills 90 tablet 0 07/28/2015 at Unknown time    Assessment: 80 year old man with hx of CAD status post CABG in 11/2005 (LIMA to LAD, SVG to diagonal, SVG to ramus, and SVG to RCA) pAF, and HTN. Transferred from [facility] due to chest pain in the setting of afib w/ RVR.  On warfarin PTA (5 mg S/T/Th/Sa and 2.5 mg  M/W/F per [facility] clinic note 9/28- INR on 9/28 = 2.9).  INR today = 2.77 Hgb 16.3, Plt 140  Goal of Therapy:  INR 2-3 Monitor platelets by anticoagulation protocol: Yes   Plan:  -Will restart home dose of warfarin (5 mg po x1 today) -Daily INR, CBC q72h -Monitor for s/sx of bleeding  Governor Specking, PharmD Clinical Pharmacy Resident Pager: [phone] 07/29/2015,12:11 PM

## 2015-07-30 ENCOUNTER — Inpatient Hospital Stay (HOSPITAL_COMMUNITY): Payer: Medicare Other

## 2015-07-30 DIAGNOSIS — R7989 Other specified abnormal findings of blood chemistry: Secondary | ICD-10-CM

## 2015-07-30 DIAGNOSIS — I4891 Unspecified atrial fibrillation: Secondary | ICD-10-CM

## 2015-07-30 DIAGNOSIS — I10 Essential (primary) hypertension: Secondary | ICD-10-CM

## 2015-07-30 LAB — PROTIME-INR
INR: 2.41 — AB (ref 0.00–1.49)
PROTHROMBIN TIME: 26 s — AB (ref 11.6–15.2)

## 2015-07-30 MED ORDER — SODIUM CHLORIDE 0.9 % IJ SOLN
3.0000 mL | Freq: Two times a day (BID) | INTRAMUSCULAR | Status: DC
  Administered 2015-07-30 – 2015-07-31 (×2): 3 mL via INTRAVENOUS

## 2015-07-30 MED ORDER — DILTIAZEM HCL ER COATED BEADS 180 MG PO CP24
180.0000 mg | ORAL_CAPSULE | Freq: Every day | ORAL | Status: DC
  Administered 2015-07-30: 180 mg via ORAL
  Filled 2015-07-30 (×2): qty 1

## 2015-07-30 MED ORDER — WARFARIN SODIUM 2.5 MG PO TABS
2.5000 mg | ORAL_TABLET | ORAL | Status: DC
  Administered 2015-07-30: 2.5 mg via ORAL
  Filled 2015-07-30: qty 1

## 2015-07-30 MED ORDER — POLYETHYLENE GLYCOL 3350 17 G PO PACK: 17.0000 g | PACK | Freq: Every day | ORAL | Status: DC | PRN

## 2015-07-30 MED ORDER — WARFARIN SODIUM 5 MG PO TABS: 5.0000 mg | ORAL_TABLET | ORAL | Status: DC

## 2015-07-30 MED ORDER — SENNOSIDES-DOCUSATE SODIUM 8.6-50 MG PO TABS
1.0000 | ORAL_TABLET | Freq: Every evening | ORAL | Status: DC | PRN
  Administered 2015-07-31: 1 via ORAL
  Filled 2015-07-30: qty 1

## 2015-07-30 MED ORDER — SODIUM CHLORIDE 0.9 % IJ SOLN: 3.0000 mL | INTRAMUSCULAR | Status: DC | PRN

## 2015-07-30 MED ORDER — DILTIAZEM HCL 60 MG PO TABS
60.0000 mg | ORAL_TABLET | Freq: Three times a day (TID) | ORAL | Status: DC
  Administered 2015-07-30: 60 mg via ORAL

## 2015-07-30 MED ORDER — SODIUM CHLORIDE 0.9 % IV SOLN
250.0000 mL | INTRAVENOUS | Status: DC
  Administered 2015-07-31: 12:00:00 via INTRAVENOUS

## 2015-07-30 NOTE — Progress Notes (Addendum)
     Subjective:  Feels better with improved BP & HR. No further CP Rates on TELE ~80s-90s. Still has cough - but improved.  Objective:  Vital Signs in the last 24 hours: Temp:  [98.3 F (36.8 C)-99.7 F (37.6 C)] 98.3 F (36.8 C) (10/10 0909) Pulse Rate:  [82-105] 91 (10/10 0909) Resp:  [16-20] 16 (10/10 0500) BP: (105-132)/(51-64) 132/63 mmHg (10/10 1343) SpO2:  [90 %-98 %] 98 % (10/10 1526) Weight:  [209 lb 9.6 oz (95.074 kg)] 209 lb 9.6 oz (95.074 kg) (10/10 0500)  Intake/Output from previous day: 10/09 0701 - 10/10 0700 In: 780 [P.O.:780] Out: 1800 [Urine:1800] Intake/Output from this shift:    Physical Exam: General appearance: alert, cooperative, appears stated age, no distress and pleasant mood & affect. Neck: no adenopathy, no carotid bruit, no JVD and supple, symmetrical, trachea midline Lungs: clear to auscultation bilaterally, normal percussion bilaterally and only mild  basal crackles Heart: Irreg-Irreg with normal rate.  No M/R/G. Abdomen: soft, non-tender; bowel sounds normal; no masses,  no organomegaly Extremities: extremities normal, atraumatic, no cyanosis or edema Pulses: 2+ and symmetric Neurologic: Grossly normal  Lab Results:  Recent Labs  07/29/15 0051 07/29/15 0940  WBC 12.8* 12.9*  HGB 17.6* 16.3  PLT 153 140*    Recent Labs  07/29/15 0051  NA 138  K 4.1  CL 107  CO2 22  GLUCOSE 132*  BUN 21*  CREATININE 1.44*    Recent Labs  07/29/15 0940 07/29/15 1658  TROPONINI 0.19* 0.13*   Hepatic Function Panel  Recent Labs  07/29/15 0051  PROT 7.5  ALBUMIN 4.1  AST 21  ALT 15*  ALKPHOS 66  BILITOT 1.1   No results for input(s): CHOL in the last 72 hours. No results for input(s): PROTIME in the last 72 hours. Flu test negative.  Imaging: Imaging results have been reviewed  Cardiac Studies:  Assessment/Plan:  Active Problems:   S/P CABG x 4: in 2007 (LIMA to LAD, SVG to diagonal, SVG to ramus intermediate, SVG to  distal RCA)   Atrial fibrillation with tachycardic ventricular rate (HCC)   Essential hypertension   Viral URI with cough   Elevated troponin  Notable improvement of Sx with rate control & IV diureis. Mild Troponin + most consistent with Demand Ischemia given HTN & Afib RVR. (Not a new Afib patient - had prior DCCV last year).  Will provide Afib educational materials.   I suspect this current episode could be the result of OTC Cold Rx (not sure of med) -- HTN & RVR occurred within a few hrs of meds.   No F/C - r/u for flu - can d/c precautions.  Rx with Tessalon.  INR is therapeutic - he feels better --> plan DCCV tomorrow & anticipate potential d/c post-procedure if successful. Convert to long acting CCB - holding ACE-I for now until BP stable.  May need standing furosemide dose.   Would plan OP Myoview to assess for ischemia given + Troponin & CP with  recurrent Afib RVR.  Has not had ischemic eval since CABG.  Not on statin or BB @ home - can defer to primary cardiologist (pt very reluctant to "try new meds")     LOS: 1 day    Dandrea Medders W 07/30/2015, 4:40 PM

## 2015-07-30 NOTE — Progress Notes (Signed)
Walked with patient, 6 laps, patient tolerated well, and heart rate stayed in 90s, although a. Fib.

## 2015-07-30 NOTE — Discharge Instructions (Addendum)
Information on my medicine - Coumadin   (Warfarin)  This medication education was reviewed with me or my healthcare representative as part of my discharge preparation.  The pharmacist that spoke with me during my hospital stay was:  Linda Hedges, [facility]  Why was Coumadin prescribed for you? Coumadin was prescribed for you because you have a blood clot or a medical condition that can cause an increased risk of forming blood clots. Blood clots can cause serious health problems by blocking the flow of blood to the heart, lung, or brain. Coumadin can prevent harmful blood clots from forming. As a reminder your indication for Coumadin is:   Stroke Prevention Because Of Atrial Fibrillation  What test will check on my response to Coumadin? While on Coumadin (warfarin) you will need to have an INR test regularly to ensure that your dose is keeping you in the desired range. The INR (international normalized ratio) number is calculated from the result of the laboratory test called prothrombin time (PT).  If an INR APPOINTMENT HAS NOT ALREADY BEEN MADE FOR YOU please schedule an appointment to have this lab work done by your health care provider within 7 days. Your INR goal is usually a number between:  2 to 3 or your provider may give you a more narrow range like 2-2.5.  Ask your health care provider during an office visit what your goal INR is.  What  do you need to  know  About  COUMADIN? Take Coumadin (warfarin) exactly as prescribed by your healthcare provider about the same time each day.  DO NOT stop taking without talking to the doctor who prescribed the medication.  Stopping without other blood clot prevention medication to take the place of Coumadin may increase your risk of developing a new clot or stroke.  Get refills before you run out.  What do you do if you miss a dose? If you miss a dose, take it as soon as you remember on the same day then continue your regularly scheduled regimen the next  day.  Do not take two doses of Coumadin at the same time.  Important Safety Information A possible side effect of Coumadin (Warfarin) is an increased risk of bleeding. You should call your healthcare provider right away if you experience any of the following: ? Bleeding from an injury or your nose that does not stop. ? Unusual colored urine (red or dark brown) or unusual colored stools (red or black). ? Unusual bruising for unknown reasons. ? A serious fall or if you hit your head (even if there is no bleeding).  Some foods or medicines interact with Coumadin (warfarin) and might alter your response to warfarin. To help avoid this: ? Eat a balanced diet, maintaining a consistent amount of Vitamin K. ? Notify your provider about major diet changes you plan to make. ? Avoid alcohol or limit your intake to 1 drink for women and 2 drinks for men per day. (1 drink is 5 oz. wine, 12 oz. beer, or 1.5 oz. liquor.)  Make sure that ANY health care provider who prescribes medication for you knows that you are taking Coumadin (warfarin).  Also make sure the healthcare provider who is monitoring your Coumadin knows when you have started a new medication including herbals and non-prescription products.  Coumadin (Warfarin)  Major Drug Interactions  Increased Warfarin Effect Decreased Warfarin Effect  Alcohol (large quantities) Antibiotics (esp. Septra/Bactrim, Flagyl, Cipro) Amiodarone (Cordarone) Aspirin (ASA) Cimetidine (Tagamet) Megestrol (Megace) NSAIDs (ibuprofen,  naproxen, etc.) Piroxicam (Feldene) Propafenone (Rythmol SR) Propranolol (Inderal) Isoniazid (INH) Posaconazole (Noxafil) Barbiturates (Phenobarbital) Carbamazepine (Tegretol) Chlordiazepoxide (Librium) Cholestyramine (Questran) Griseofulvin Oral Contraceptives Rifampin Sucralfate (Carafate) Vitamin K   Coumadin (Warfarin) Major Herbal Interactions  Increased Warfarin Effect Decreased Warfarin Effect   Garlic Ginseng Ginkgo biloba Coenzyme Q10 Green tea St. Johns wort    Coumadin (Warfarin) FOOD Interactions  Eat a consistent number of servings per week of foods HIGH in Vitamin K (1 serving =  cup)  Collards (cooked, or boiled & drained) Kale (cooked, or boiled & drained) Mustard greens (cooked, or boiled & drained) Parsley *serving size only =  cup Spinach (cooked, or boiled & drained) Swiss chard (cooked, or boiled & drained) Turnip greens (cooked, or boiled & drained)  Eat a consistent number of servings per week of foods MEDIUM-HIGH in Vitamin K (1 serving = 1 cup)  Asparagus (cooked, or boiled & drained) Broccoli (cooked, boiled & drained, or raw & chopped) Brussel sprouts (cooked, or boiled & drained) *serving size only =  cup Lettuce, raw (green leaf, endive, romaine) Spinach, raw Turnip greens, raw & chopped   These websites have more information on Coumadin (warfarin):  FailFactory.se; VeganReport.com.au;   Atrial Fibrillation Atrial fibrillation is a type of irregular or rapid heartbeat (arrhythmia). In atrial fibrillation, the heart quivers continuously in a chaotic pattern. This occurs when parts of the heart receive disorganized signals that make the heart unable to pump blood normally. This can increase the risk for stroke, heart failure, and other heart-related conditions. There are different types of atrial fibrillation, including:  Paroxysmal atrial fibrillation. This type starts suddenly, and it usually stops on its own shortly after it starts.  Persistent atrial fibrillation. This type often lasts longer than a week. It may stop on its own or with treatment.  Long-lasting persistent atrial fibrillation. This type lasts longer than 12 months.  Permanent atrial fibrillation. This type does not go away. Talk with your health care provider to learn about the type of atrial fibrillation that you have. CAUSES This condition is caused by  some heart-related conditions or procedures, including:  A heart attack.  Coronary artery disease.  Heart failure.  Heart valve conditions.  High blood pressure.  Inflammation of the sac that surrounds the heart (pericarditis).  Heart surgery.  Certain heart rhythm disorders, such as Wolf-Parkinson-White syndrome. Other causes include:  Pneumonia.  Obstructive sleep apnea.  Blockage of an artery in the lungs (pulmonary embolism, or PE).  Lung cancer.  Chronic lung disease.  Thyroid problems, especially if the thyroid is overactive (hyperthyroidism).  Caffeine.  Excessive alcohol use or illegal drug use.  Use of some medicines, including certain decongestants and diet pills. Sometimes, the cause cannot be found. RISK FACTORS This condition is more likely to develop in:  People who are older in age.  People who smoke.  People who have diabetes mellitus.  People who are overweight (obese).  Athletes who exercise vigorously. SYMPTOMS Symptoms of this condition include:  A feeling that your heart is beating rapidly or irregularly.  A feeling of discomfort or pain in your chest.  Shortness of breath.  Sudden light-headedness or weakness.  Getting tired easily during exercise. In some cases, there are no symptoms. DIAGNOSIS Your health care provider may be able to detect atrial fibrillation when taking your pulse. If detected, this condition may be diagnosed with:  An electrocardiogram (ECG).  A Holter monitor test that records your heartbeat patterns over a 24-hour period.  Transthoracic  echocardiogram (TTE) to evaluate how blood flows through your heart.  Transesophageal echocardiogram (TEE) to view more detailed images of your heart.  A stress test.  Imaging tests, such as a CT scan or chest X-ray.  Blood tests. TREATMENT The main goals of treatment are to prevent blood clots from forming and to keep your heart beating at a normal rate and  rhythm. The type of treatment that you receive depends on many factors, such as your underlying medical conditions and how you feel when you are experiencing atrial fibrillation. This condition may be treated with:  Medicine to slow down the heart rate, bring the heart's rhythm back to normal, or prevent clots from forming.  Electrical cardioversion. This is a procedure that resets your heart's rhythm by delivering a controlled, low-energy shock to the heart through your skin.  Different types of ablation, such as catheter ablation, catheter ablation with pacemaker, or surgical ablation. These procedures destroy the heart tissues that send abnormal signals. When the pacemaker is used, it is placed under your skin to help your heart beat in a regular rhythm. HOME CARE INSTRUCTIONS  Take over-the counter and prescription medicines only as told by your health care provider.  If your health care provider prescribed a blood-thinning medicine (anticoagulant), take it exactly as told. Taking too much blood-thinning medicine can cause bleeding. If you do not take enough blood-thinning medicine, you will not have the protection that you need against stroke and other problems.  Do not use tobacco products, including cigarettes, chewing tobacco, and e-cigarettes. If you need help quitting, ask your health care provider.  If you have obstructive sleep apnea, manage your condition as told by your health care provider.  Do not drink alcohol.  Do not drink beverages that contain caffeine, such as coffee, soda, and tea.  Maintain a healthy weight. Do not use diet pills unless your health care provider approves. Diet pills may make heart problems worse.  Follow diet instructions as told by your health care provider.  Exercise regularly as told by your health care provider.  Keep all follow-up visits as told by your health care provider. This is important. PREVENTION  Avoid drinking beverages that  contain caffeine or alcohol.  Avoid certain medicines, especially medicines that are used for breathing problems.  Avoid certain herbs and herbal medicines, such as those that contain ephedra or ginseng.  Do not use illegal drugs, such as cocaine and amphetamines.  Do not smoke.  Manage your high blood pressure. SEEK MEDICAL CARE IF:  You notice a change in the rate, rhythm, or strength of your heartbeat.  You are taking an anticoagulant and you notice increased bruising.  You tire more easily when you exercise or exert yourself. SEEK IMMEDIATE MEDICAL CARE IF:  You have chest pain, abdominal pain, sweating, or weakness.  You feel nauseous.  You notice blood in your vomit, bowel movement, or urine.  You have shortness of breath.  You suddenly have swollen feet and ankles.  You feel dizzy.  You have sudden weakness or numbness of the face, arm, or leg, especially on one side of the body.  You have trouble speaking, trouble understanding, or both (aphasia).  Your face or your eyelid droops on one side. These symptoms may represent a serious problem that is an emergency. Do not wait to see if the symptoms will go away. Get medical help right away. Call your local emergency services (911 in the [place] Do not drive yourself to the hospital.  This information is not intended to replace advice given to you by your health care provider. Make sure you discuss any questions you have with your health care provider. °  °Document Released: 10/06/2005 Document Revised: 06/27/2015 Document Reviewed: 01/31/2015 °Elsevier Interactive Patient Education ©2016 Elsevier Inc. ° °

## 2015-07-30 NOTE — Progress Notes (Signed)
ANTICOAGULATION CONSULT NOTE - Follow Up Consult  Pharmacy Consult for coumadin Indication: atrial fibrillation  Allergies  Allergen Reactions  . Clonidine Derivatives   . Statins     Myalgias:  Previously on Crestor,Vytorin,Simvastatin,Lipitor & Pravastatin  . Sulfa Antibiotics Swelling    Patient Measurements: Height: '5\' 6"'$  (167.6 cm) Weight: 209 lb 9.6 oz (95.074 kg) IBW/kg (Calculated) : 63.8  Vital Signs: Temp: 98.3 F (36.8 C) (10/10 0909) Temp Source: Oral (10/10 0909) BP: 115/61 mmHg (10/10 0909) Pulse Rate: 91 (10/10 0909)  Labs:  Recent Labs  07/29/15 0051 07/29/15 0940 07/29/15 1658 07/30/15 0358  HGB 17.6* 16.3  --   --   HCT 49.8 48.5  --   --   PLT 153 140*  --   --   LABPROT 28.8*  --   --  26.0*  INR 2.77*  --   --  2.41*  CREATININE 1.44*  --   --   --   TROPONINI 0.11* 0.19* 0.13*  --     Estimated Creatinine Clearance: 45.6 mL/min (by C-G formula based on Cr of 1.44).   Medications:  Scheduled:  . diltiazem  60 mg Oral 3 times per day  . ipratropium-albuterol  3 mL Nebulization TID  . tamsulosin  0.4 mg Oral QPC supper  . warfarin  2.5 mg Oral Once per day on Mon Wed Fri  . [START ON 07/31/2015] warfarin  5 mg Oral Once per day on Sun Tue Thu Sat  . Warfarin - Pharmacist Dosing Inpatient   Does not apply q1800    Assessment: 80 year old man with hx of CAD status post CABG in 11/2005 (LIMA to LAD, SVG to diagonal, SVG to ramus, and SVG to RCA) PAF, and HTN. Transferred from [facility] due to chest pain in the setting of afib w/ RVR. He is on coumadin PTA and pharmacy has been consulted to dose. -INR is 2.41 and at goal  Home coumadin regimen: 5 mg S/T/Th/Sa and 2.5 mg M/W/F  Goal of Therapy:  INR 2-3 Monitor platelets by anticoagulation protocol: Yes   Plan:  -Continue coumadin at the home dose -Daily PT/INR for now  Hildred Laser, Pharm D 07/30/2015 10:16 AM

## 2015-07-31 ENCOUNTER — Other Ambulatory Visit: Payer: Self-pay | Admitting: Cardiology

## 2015-07-31 ENCOUNTER — Encounter (HOSPITAL_COMMUNITY): Payer: Self-pay | Admitting: *Deleted

## 2015-07-31 ENCOUNTER — Inpatient Hospital Stay (HOSPITAL_COMMUNITY): Payer: Medicare Other | Admitting: Critical Care Medicine

## 2015-07-31 ENCOUNTER — Encounter (HOSPITAL_COMMUNITY)
Admission: EM | Disposition: A | Discharge status: Home / Self Care | Payer: Self-pay | Source: Home / Self Care | Attending: Internal Medicine

## 2015-07-31 DIAGNOSIS — R778 Other specified abnormalities of plasma proteins: Secondary | ICD-10-CM

## 2015-07-31 DIAGNOSIS — R0789 Other chest pain: Principal | ICD-10-CM

## 2015-07-31 DIAGNOSIS — I4891 Unspecified atrial fibrillation: Secondary | ICD-10-CM

## 2015-07-31 DIAGNOSIS — R079 Chest pain, unspecified: Secondary | ICD-10-CM

## 2015-07-31 DIAGNOSIS — R7989 Other specified abnormal findings of blood chemistry: Secondary | ICD-10-CM

## 2015-07-31 DIAGNOSIS — Z951 Presence of aortocoronary bypass graft: Secondary | ICD-10-CM

## 2015-07-31 HISTORY — PX: CARDIOVERSION: SHX1299

## 2015-07-31 LAB — PROTIME-INR
INR: 2.3 — ABNORMAL HIGH (ref 0.00–1.49)
Prothrombin Time: 25.1 seconds — ABNORMAL HIGH (ref 11.6–15.2)

## 2015-07-31 SURGERY — CARDIOVERSION
Anesthesia: Monitor Anesthesia Care

## 2015-07-31 MED ORDER — OFF THE BEAT BOOK
Freq: Once | Status: DC
  Filled 2015-07-31: qty 1

## 2015-07-31 MED ORDER — PROPOFOL 10 MG/ML IV BOLUS
INTRAVENOUS | Status: DC | PRN
  Administered 2015-07-31: 30 mg via INTRAVENOUS
  Administered 2015-07-31: 70 mg via INTRAVENOUS

## 2015-07-31 MED ORDER — LIDOCAINE HCL (CARDIAC) 20 MG/ML IV SOLN
INTRAVENOUS | Status: DC | PRN
  Administered 2015-07-31: 20 mg via INTRAVENOUS

## 2015-07-31 MED ORDER — DILTIAZEM HCL ER COATED BEADS 180 MG PO CP24: 180.0000 mg | ORAL_CAPSULE | Freq: Every day | ORAL | Status: DC

## 2015-07-31 MED ORDER — METOPROLOL TARTRATE 1 MG/ML IV SOLN
INTRAVENOUS | Status: DC | PRN
  Administered 2015-07-31: 5 mg via INTRAVENOUS

## 2015-07-31 MED ORDER — METOPROLOL TARTRATE 1 MG/ML IV SOLN
INTRAVENOUS | Status: AC
  Filled 2015-07-31: qty 5

## 2015-07-31 NOTE — Discharge Summary (Signed)
Physician Discharge Summary  Patient ID: [name] MRN: [id number] DOB/AGE: Jun 09, 1935 80 y.o.   Primary Cardiologist: Dr. Sallyanne Kuster  Admit date: 07/29/2015 Discharge date: 07/31/2015  Admission Diagnoses: Atrial Fibrillation w/ RVR  Discharge Diagnoses:  Active Problems:   S/P CABG x 4: in 2007 (LIMA to LAD, SVG to diagonal, SVG to ramus intermediate, SVG to distal RCA)   Essential hypertension   Atrial fibrillation with tachycardic ventricular rate (HCC)   Viral URI with cough   Elevated troponin   Discharged Condition: stable  Hospital Course: [name] is a 80 year old male with a history of coronary artery disease status post CABG in 11/2005 (LIMA to LAD, SVG to diagonal, SVG to ramus, and SVG to RCA), paroxysmal atrial fibrillation on warfarin and hypertension, who was  transferred to the cardiology service from [facility] due to chest pain in the setting of atrial fibrillation with a rapid ventricular response. He also noted a recent history of URI symptoms and took OTC cough medicines prior to presenting to the ED.  On arrival, he was noted to be in atrial fibrillation with RVR and was started on IV Cardizem with improvement in rate. However, he remained in atrial fibrillation. Symptoms resolved with rate control.  He was continued on warfarin for anticoagulation. INR was therapeutic on arrival and OP INRs prior to admit were also therapeutic.  TSH and electrolytes were normal. 2D echo showed normal LVEF of 60-65%. Cardiac enzymes were cycled x 3 and were low level with flat trend (0.11, 0.19, 0.13). His CP resolved with rate control and it was felt that his slight enzyme elevation was secondary to demand ischemia in the setting of afib w/ RVR. No inpatient w/u was indicated. It was felt that his recurrence of afib was likely secondary to his URI and recent OTC cold medicine. URI w/u was  negative including negative flu screen. Supportive care was recommend and PRN Robitussin  was added.    On 07/31/15, he underwent successful DCCV. Post procedural EKG showed sinus rhythm with a HR in the 70s. He had no post-procedural complications. He was last seen and examined by Dr. Ellyn Hack who determined he was stable for discharge home. He was continued on PO Cardizem and warfarin. Note, his lisinopril/HCTZ was held during admission and on discharge due to soft BP. This can be restarted in the office if needed.   It should also be noted that it was recommended that he undergo an outpatient NST, given the fact that he has had no ischemic testing since undergoing CABG almost 10 years ago. This will be arranged in our office prior to his post hospital f/u on 08/16/15.   Consults: None  Significant Diagnostic Studies:   Treatments: DCCV 07/31/15.   Discharge Exam: Blood pressure 113/58, pulse 70, temperature 98.6 F (37 C), temperature source Oral, resp. rate 18, height '5\' 6"'$  (1.676 m), weight 207 lb 4.8 oz (94.031 kg), SpO2 94 %.   Disposition: 01-Home or Self Care      Discharge Instructions    Diet - low sodium heart healthy    Complete by:  As directed      Increase activity slowly    Complete by:  As directed             Medication List    STOP taking these medications        lisinopril-hydrochlorothiazide 20-25 MG tablet  Commonly known as:  PRINZIDE,ZESTORETIC      TAKE these medications  aspirin EC 81 MG tablet  Take 81 mg by mouth daily.     cetirizine 10 MG tablet  Commonly known as:  ZYRTEC  Take 10 mg by mouth daily.     diltiazem 180 MG 24 hr capsule  Commonly known as:  CARDIZEM CD  Take 1 capsule (180 mg total) by mouth daily.     tamsulosin 0.4 MG Caps capsule  Commonly known as:  FLOMAX  Take 0.4 mg by mouth daily after supper.     warfarin 5 MG tablet  Commonly known as:  COUMADIN  Take 1 tablet by mouth daily or as directed by coumadin clinic.  Need MD appt for further refills       Follow-up Information    Follow up  with Rosaria Ferries, PA-C On 08/16/2015.   Specialties:  Cardiology, Radiology   Why:  2:30 PM (Dr. Timmie Foerster' [place])   Contact information:   [address] [place] [address] [phone]       Follow up with [facility].   Why:  our office will call you with an appointment for your stress test.    Contact information:   [address] [place] [address] [place], [place]: >30 MINUTES  Signed: Lyda Jester 07/31/2015, 3:22 PM

## 2015-07-31 NOTE — Transfer of Care (Signed)
Immediate Anesthesia Transfer of Care Note  Patient: [name]  Procedure(s) Performed: Procedure(s): CARDIOVERSION (N/A)  Patient Location: Endoscopy Unit  Anesthesia Type:General  Level of Consciousness: awake and alert   Airway & Oxygen Therapy: Patient Spontanous Breathing  Post-op Assessment: Report given to RN, Post -op Vital signs reviewed and stable and Patient moving all extremities X 4  Post vital signs: Reviewed and stable  Last Vitals:  Filed Vitals:   07/31/15 1117  BP: 136/69  Pulse: 81  Temp:   Resp: 18    Complications: No apparent anesthesia complications

## 2015-07-31 NOTE — Progress Notes (Addendum)
Patient Profile: 80 y/o male with h/o CAD s/p CABG in 2007, PAF and HTN admitted with atrial fibrillation w/ RVR and elevated troponin. Also with viral URI.   Subjective: Still with a dry cough. Denies palpitations. No dyspnea or chest pain.   Objective: Vital signs in last 24 hours: Temp:  [97.9 F (36.6 C)-98.3 F (36.8 C)] 97.9 F (36.6 C) (10/11 0500) Pulse Rate:  [74-91] 84 (10/11 0500) Resp:  [18] 18 (10/11 0500) BP: (115-132)/(61-70) 130/70 mmHg (10/11 0500) SpO2:  [94 %-98 %] 96 % (10/11 0500) Weight:  [207 lb 1.6 oz (93.94 kg)-207 lb 4.8 oz (94.031 kg)] 207 lb 4.8 oz (94.031 kg) (10/11 0500) Last BM Date: 07/30/15  Intake/Output from previous day: 10/10 0701 - 10/11 0700 In: 240 [P.O.:240] Out: 325 [Urine:325] Intake/Output this shift:    Medications Current Facility-Administered Medications  Medication Dose Route Frequency Provider Last Rate Last Dose  . 0.9 %  sodium chloride infusion  250 mL Intravenous Continuous Bhavinkumar Bhagat, PA      . acetaminophen (TYLENOL) tablet 650 mg  650 mg Oral Q4H PRN Nelva Bush, MD   650 mg at 07/29/15 1319  . albuterol (PROVENTIL) (2.5 MG/3ML) 0.083% nebulizer solution 2.5 mg  2.5 mg Nebulization Q4H PRN Deboraha Sprang, MD   2.5 mg at 07/29/15 0947  . diltiazem (CARDIZEM CD) 24 hr capsule 180 mg  180 mg Oral Daily Leonie Man, MD   180 mg at 07/30/15 1724  . guaiFENesin-dextromethorphan (ROBITUSSIN DM) 100-10 MG/5ML syrup 5 mL  5 mL Oral Q4H PRN Deboraha Sprang, MD   5 mL at 07/30/15 1949  . ipratropium-albuterol (DUONEB) 0.5-2.5 (3) MG/3ML nebulizer solution 3 mL  3 mL Nebulization TID Deboraha Sprang, MD   3 mL at 07/30/15 2119  . ondansetron (ZOFRAN) injection 4 mg  4 mg Intravenous Q6H PRN Christopher End, MD      . polyethylene glycol (MIRALAX / GLYCOLAX) packet 17 g  17 g Oral Daily PRN Lamar Sprinkles, MD      . senna-docusate (Senokot-S) tablet 1 tablet  1 tablet Oral QHS PRN Lamar Sprinkles, MD      . sodium  chloride 0.9 % injection 3 mL  3 mL Intravenous Q12H Bhavinkumar Bhagat, PA   3 mL at 07/30/15 2200  . sodium chloride 0.9 % injection 3 mL  3 mL Intravenous PRN Bhavinkumar Bhagat, PA      . tamsulosin (FLOMAX) capsule 0.4 mg  0.4 mg Oral QPC supper Nelva Bush, MD   0.4 mg at 07/30/15 1723  . warfarin (COUMADIN) tablet 2.5 mg  2.5 mg Oral Once per day on Mon Wed Fri Kris Mouton, RPH   2.5 mg at 07/30/15 1723  . warfarin (COUMADIN) tablet 5 mg  5 mg Oral Once per day on Sun Tue Thu Sat Kris Mouton, [facility]      . Warfarin - Pharmacist Dosing Inpatient   Does not apply q1800 Meagan A Burnett Harry, RPH   0  at 07/29/15 1800    PE: General appearance: alert, cooperative and no distress Neck: no carotid bruit and no JVD Lungs: clear to auscultation bilaterally Heart: irregularly irregular rhythm Extremities: no LEE Pulses: 2+ and symmetric Skin: warm and dry Neurologic: Grossly normal  Lab Results:   Recent Labs  07/29/15 0051 07/29/15 0940  WBC 12.8* 12.9*  HGB 17.6* 16.3  HCT 49.8 48.5  PLT 153 140*   BMET  Recent Labs  07/29/15 0051  NA 138  K 4.1  CL 107  CO2 22  GLUCOSE 132*  BUN 21*  CREATININE 1.44*  CALCIUM 8.7*   PT/INR  Recent Labs  07/29/15 0051 07/30/15 0358 07/31/15 0456  LABPROT 28.8* 26.0* 25.1*  INR 2.77* 2.41* 2.30*   Cardiac Panel (last 3 results)  Recent Labs  07/29/15 0051 07/29/15 0940 07/29/15 1658  TROPONINI 0.11* 0.19* 0.13*    Studies/Results: Study Conclusions  - Left ventricle: The cavity size was normal. Wall thickness was increased in a pattern of mild LVH. Systolic function was normal. The estimated ejection fraction was in the range of 60% to 65%. Wall motion was normal; there were no regional wall motion abnormalities. - Right ventricle: Systolic function was mildly reduced. - Right atrium: The atrium was mildly dilated.    Assessment/Plan    Active Problems:   S/P CABG x 4: in 2007 (LIMA to LAD, SVG  to diagonal, SVG to ramus intermediate, SVG to distal RCA)   Essential hypertension   Atrial fibrillation with tachycardic ventricular rate (HCC)   Viral URI with cough   Elevated troponin   1. Atrial Fibrillation: rate is well controlled in the 70s but still in afib. INR is therapeutic at 2.30. Plan is for DCCV today. For now, continue Cardizem and Warfarin.  - On Warfarin CHA2DS2VASc SCore is 4 (Age 46-1, CAD - 2, HTN -1)  2. Elevated troponin: flat low level trend. Not diagnostic of ACS. Likely secondary to demand ischemia in the setting of atrial fibrillation.   3. Viral URI: ruled-out for flu. Continue supportive care. PRN Robitussin for cough.   4. HTN: BP is well controlled on current regimen.   5. CAD: h/o CABG in 2007. Enzyme trend not convincing for ACS. However he has not had an ischemic eval since his CABG. Will plan for OP Myoview.     LOS: 2 days    Brittainy M. Rosita Fire, PA-C 07/31/2015 8:03 AM  I have seen, examined and evaluated the patient this AM along with Ms. Rosita Fire, PA-C.  After reviewing all the available data and chart,  I agree with her findings, examination as well as impression recommendations.  A. fib rate control on current dose of calcium channel blocker. Therapeutic INR. Plan is for DCCV today. With the low flat trend troponin elevation, I agree that this is probably simply demand ischemia. However has been several years since last ischemic evaluation in a patient who has had CABG. I don't see that he has had anything since his CABG. Will schedule outpatient Myoview prior to follow-up with Dr. Sallyanne Kuster.  Blood pressure stable.  Successful cardioversion today, would likely consider discharge home after bedrest.   HARDING, Leonie Green, M.D., M.S. Interventional Cardiologist   Pager # [phone]

## 2015-07-31 NOTE — Progress Notes (Signed)
ANTICOAGULATION CONSULT NOTE - Follow Up Consult  Pharmacy Consult for coumadin Indication: atrial fibrillation  Allergies  Allergen Reactions  . Clonidine Derivatives Other (See Comments)    Pt was not aware of a reaction to this medication  . Statins Other (See Comments)    Myalgias:  Previously on Crestor,Vytorin,Simvastatin,Lipitor & Pravastatin  . Sulfa Antibiotics Swelling    Patient Measurements: Height: '5\' 6"'$  (167.6 cm) Weight: 207 lb 4.8 oz (94.031 kg) IBW/kg (Calculated) : 63.8  Vital Signs: Temp: 97.9 F (36.6 C) (10/11 0500) Temp Source: Oral (10/11 0500) BP: 130/70 mmHg (10/11 0500) Pulse Rate: 83 (10/11 0840)  Labs:  Recent Labs  07/29/15 0051 07/29/15 0940 07/29/15 1658 07/30/15 0358 07/31/15 0456  HGB 17.6* 16.3  --   --   --   HCT 49.8 48.5  --   --   --   PLT 153 140*  --   --   --   LABPROT 28.8*  --   --  26.0* 25.1*  INR 2.77*  --   --  2.41* 2.30*  CREATININE 1.44*  --   --   --   --   TROPONINI 0.11* 0.19* 0.13*  --   --     Estimated Creatinine Clearance: 45.4 mL/min (by C-G formula based on Cr of 1.44).   Medications:  Scheduled:  . diltiazem  180 mg Oral Daily  . ipratropium-albuterol  3 mL Nebulization TID  . sodium chloride  3 mL Intravenous Q12H  . tamsulosin  0.4 mg Oral QPC supper  . warfarin  2.5 mg Oral Once per day on Mon Wed Fri  . warfarin  5 mg Oral Once per day on Sun Tue Thu Sat  . Warfarin - Pharmacist Dosing Inpatient   Does not apply q1800    Assessment: 80 year old man with hx of CAD status post CABG in 11/2005 (LIMA to LAD, SVG to diagonal, SVG to ramus, and SVG to RCA) PAF, and HTN. Transferred from [facility] due to chest pain in the setting of afib w/ RVR. He is on coumadin PTA and pharmacy has been consulted to dose. -INR is 2.41 and at goal  Home coumadin regimen: 5 mg S/T/Th/Sa and 2.5 mg M/W/F  Goal of Therapy:  INR 2-3 Monitor platelets by anticoagulation protocol: Yes   Plan:  -INR within desired  range; continue home dose of warfarin for now. -Daily PT/INR for now  Vincenza Hews, PharmD, BCPS 07/31/2015, 10:03 AM Pager: [phone]

## 2015-07-31 NOTE — Anesthesia Postprocedure Evaluation (Signed)
  Anesthesia Post-op Note  Patient: [name]  Procedure(s) Performed: Procedure(s): CARDIOVERSION (N/A)  Patient Location: Endoscopy Unit  Anesthesia Type:General  Level of Consciousness: awake, alert , oriented and patient cooperative  Airway and Oxygen Therapy: Patient Spontanous Breathing  Post-op Pain: none  Post-op Assessment: Post-op Vital signs reviewed, Patient's Cardiovascular Status Stable, Respiratory Function Stable, Patent Airway, No signs of Nausea or vomiting and Pain level controlled              Post-op Vital Signs: Reviewed and stable  Last Vitals:  Filed Vitals:   07/31/15 1250  BP: 113/58  Pulse: 70  Temp:   Resp: 18    Complications: No apparent anesthesia complications

## 2015-07-31 NOTE — Interval H&P Note (Signed)
History and Physical Interval Note:  07/31/2015 9:27 AM  [name]  has presented today for surgery, with the diagnosis of afib  The various methods of treatment have been discussed with the patient and family. After consideration of risks, benefits and other options for treatment, the patient has consented to  Procedure(s): CARDIOVERSION (N/A) as a surgical intervention .  The patient's history has been reviewed, patient examined, no change in status, stable for surgery.  I have reviewed the patient's chart and labs.  Questions were answered to the patient's satisfaction.     Jenkins Rouge

## 2015-07-31 NOTE — CV Procedure (Signed)
DCC:  Anesthesia Dr Glennon Mac 100 mg propofol and 20 mg lidocaine  Klemme 120 J converted to NSR for a minute or two then reverted Union 20 J  Maintained NSR  Given '5mg'$  of iv lopressor to blunt adrenergic tone  No immediate neurologic sequelae on Rx coumadin INR 2.3  Jenkins Rouge

## 2015-07-31 NOTE — Anesthesia Preprocedure Evaluation (Addendum)
Anesthesia Evaluation  Patient identified by MRN, date of birth, ID band Patient awake    Reviewed: Allergy & Precautions, NPO status , Patient's Chart, lab work & pertinent test results  History of Anesthesia Complications Negative for: history of anesthetic complications  Airway Mallampati: II  TM Distance: >3 FB Neck ROM: Full    Dental  (+) Edentulous Upper, Missing, Dental Advisory Given, Partial Lower   Pulmonary Recent URI , Residual Cough, former smoker,    breath sounds clear to auscultation       Cardiovascular hypertension, Pt. on medications (-) angina+ CAD and + CABG   Rhythm:Irregular Rate:Normal  07/30/15 ECHO: EF 60-65%, valves OK   Neuro/Psych negative neurological ROS     GI/Hepatic negative GI ROS, Neg liver ROS,   Endo/Other  Morbid obesity  Renal/GU Renal InsufficiencyRenal disease (creat 1.44)     Musculoskeletal   Abdominal (+) + obese,   Peds  Hematology  (+) Blood dyscrasia (coumadin), ,   Anesthesia Other Findings   Reproductive/Obstetrics                           Anesthesia Physical Anesthesia Plan  ASA: III  Anesthesia Plan: General   Post-op Pain Management:    Induction: Intravenous  Airway Management Planned: Mask  Additional Equipment:   Intra-op Plan:   Post-operative Plan:   Informed Consent: I have reviewed the patients History and Physical, chart, labs and discussed the procedure including the risks, benefits and alternatives for the proposed anesthesia with the patient or authorized representative who has indicated his/her understanding and acceptance.   Dental advisory given  Plan Discussed with: CRNA and Surgeon  Anesthesia Plan Comments: (Plan routine monitors, GA for cardioversion)        Anesthesia Quick Evaluation

## 2015-08-01 ENCOUNTER — Telehealth (HOSPITAL_COMMUNITY): Payer: Self-pay | Admitting: *Deleted

## 2015-08-01 ENCOUNTER — Encounter (HOSPITAL_COMMUNITY): Payer: Self-pay | Admitting: Cardiovascular Disease

## 2015-08-07 LAB — RESPIRATORY VIRUS PANEL
ADENOVIRUS: NEGATIVE
INFLUENZA A: NEGATIVE
INFLUENZA B 1: NEGATIVE
Metapneumovirus: NEGATIVE
PARAINFLUENZA 2 A: NEGATIVE
PARAINFLUENZA 3 A: NEGATIVE
Parainfluenza 1: NEGATIVE
RESPIRATORY SYNCYTIAL VIRUS B: NEGATIVE
RHINOVIRUS: POSITIVE — AB
Respiratory Syncytial Virus A: NEGATIVE

## 2015-08-13 ENCOUNTER — Telehealth (HOSPITAL_COMMUNITY): Payer: Self-pay | Admitting: *Deleted

## 2015-08-15 ENCOUNTER — Ambulatory Visit (INDEPENDENT_AMBULATORY_CARE_PROVIDER_SITE_OTHER): Payer: Medicare Other | Admitting: *Deleted

## 2015-08-15 DIAGNOSIS — I4891 Unspecified atrial fibrillation: Secondary | ICD-10-CM | POA: Diagnosis not present

## 2015-08-15 DIAGNOSIS — Z5181 Encounter for therapeutic drug level monitoring: Secondary | ICD-10-CM

## 2015-08-15 LAB — POCT INR: INR: 3.4

## 2015-08-16 ENCOUNTER — Encounter: Payer: Self-pay | Admitting: Physician Assistant

## 2015-08-16 ENCOUNTER — Ambulatory Visit (INDEPENDENT_AMBULATORY_CARE_PROVIDER_SITE_OTHER): Payer: Medicare Other | Admitting: Physician Assistant

## 2015-08-16 VITALS — BP 120/76 | HR 111 | Wt 208.8 lb

## 2015-08-16 DIAGNOSIS — R0609 Other forms of dyspnea: Secondary | ICD-10-CM

## 2015-08-16 DIAGNOSIS — I4891 Unspecified atrial fibrillation: Secondary | ICD-10-CM | POA: Diagnosis not present

## 2015-08-16 DIAGNOSIS — Z7901 Long term (current) use of anticoagulants: Secondary | ICD-10-CM | POA: Diagnosis not present

## 2015-08-16 MED ORDER — METOPROLOL SUCCINATE ER 25 MG PO TB24: 25.0000 mg | ORAL_TABLET | Freq: Every day | ORAL | Status: DC

## 2015-08-16 MED ORDER — AMIODARONE HCL 400 MG PO TABS: 400.0000 mg | ORAL_TABLET | Freq: Every day | ORAL | Status: DC

## 2015-08-16 NOTE — Patient Instructions (Addendum)
Rhonda Barrett, PA-C, has recommended making the following medication changes: START Metoprolol Succinate (Toprol XL) 25 mg - take 1 tablet by mouth daily. A new prescription has been sent to your pharmacy, [facility], electronically.  Your physician recommends that you schedule a follow-up appointment in 2 months with [name].  If you need a refill on your cardiac medications before your next appointment, please call your pharmacy.

## 2015-08-16 NOTE — Progress Notes (Signed)
Cardiology Office Note   Date:  08/16/2015   ID:  [name], DOB [birth year], MRN [id number]  PCP:  Leonides Grills, MD  Cardiologist:  Dr Juanetta Snow, PA-C   Chief Complaint  Patient presents with  . Hospitalization Follow-up    discuss stress test   History of Present Illness: [name] is a 80 y.o. male with a history of CABG in 11/2005 (LIMA to LAD, SVG to diagonal, SVG to ramus, and SVG to RCA), paroxysmal atrial fibrillation, and hypertension. S/P DCCV 07/31/2015  [name] presents for post hospital follow-up.  Since discharge from the hospital, he has not had chest pain. He has had no appetite and is eating very little. His wife reports that he is doing very little and feels tired all the time.   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 He has dyspnea on exertion, but he likes to walk at a very fast pace and is not able to catch his breath if he tries to do that now. Lower extremity edema has not changed at all. He has finally recovered from his cold, and is no longer coughing. He feels that his shortness of breath has improved a bit since his cold got better, but he is still nowhere near where he would like to be. He has no palpitations and no awareness that his heart is out of rhythm.  Past Medical History  Diagnosis Date  . Hypertension   . Coronary artery disease   . Atrial fibrillation [facility])     Past Surgical History  Procedure Laterality Date  . Coronary artery bypass graft  12/10/2005    LIMA to LAD,SVG to diagonal,SVG to ramus intermediate,SVG to distal RCA.  . Tonsillectomy    . Tee without cardioversion  05/10/2012    Procedure: TRANSESOPHAGEAL ECHOCARDIOGRAM (TEE);  Surgeon: [name], MD;  Location: [facility] ENDOSCOPY;  Service: Cardiovascular;  Laterality: N/A;  . Cardioversion  05/10/2012    Procedure: CARDIOVERSION;  Surgeon: [name], MD;  Location: [facility] ENDOSCOPY;  Service: Cardiovascular;  Laterality: N/A;  . Cardiac catheterization  10/28/2005    significant 3 vessel CAD  . Nm myocar perf wall motion  09/16/2005    evidence of diaphragmatic attenuation & subtle anterolateral ischemia.  . Cardioversion N/A 07/31/2015    Procedure: CARDIOVERSION;  Surgeon: Josue Hector, MD;  Location: [facility] ENDOSCOPY;  Service: Cardiovascular;  Laterality: N/A;    Current Outpatient Prescriptions  Medication Sig Dispense Refill  . aspirin EC 81 MG tablet Take 81 mg by mouth daily.    . cetirizine (ZYRTEC) 10 MG tablet Take 10 mg by mouth daily.    Marland Kitchen diltiazem (CARDIZEM CD) 180 MG 24 hr capsule Take 1 capsule (180 mg total) by mouth daily. 30 capsule 5  . Tamsulosin HCl (FLOMAX) 0.4 MG CAPS Take 0.4 mg by mouth daily after supper.    . warfarin (COUMADIN) 5 MG tablet Take 1 tablet by mouth daily or as directed by coumadin clinic.  Need MD appt for further refills (Patient taking differently: Take 2.5-5 mg by mouth daily after supper. Take 1/2 tablet (2.5 mg) by mouth on Monday, Wednesday, Friday, take 1 tablet (5 mg) on Sunday, Tuesday, Thursday, Saturday or as directed by coumadin clinic) 90 tablet 0   No current facility-administered medications for this visit.    Allergies:  Clonidine derivatives; Diltiazem hcl er; Statins; and Sulfa antibiotics    Social History:  The patient  reports that he has quit smoking. He does not have any smokeless tobacco history on file. He reports that he does not drink alcohol or use illicit drugs.   Family History:  The patient's family history includes Cancer (age of onset: 34) in his sister; Heart attack (age of onset: 22) in his brother; Heart attack (age of onset: 82) in his father; Stroke (age of onset: 15) in his mother; Stroke (age of onset: 64) in his father.    ROS:  Please see the history of present illness. All other systems are reviewed and negative.    PHYSICAL EXAM: VS:  BP 120/76  mmHg  Pulse 111  Wt 208 lb 12.8 oz (94.711 kg) , BMI Body mass index is 33.72 kg/(m^2). GEN: Well nourished, well developed, male in no acute distress HEENT: normal for age  Neck: no JVD, no carotid bruit, no masses Cardiac: Irregular R&R; soft murmur, no rubs, or gallops Respiratory:  Slightly decreased breath sounds bases bilaterally, normal work of breathing GI: soft, nontender, nondistended, + BS MS: no deformity or atrophy; trace right lower extremity edema; distal pulses are 2+ in all 4 extremities  Skin: warm and dry, no rash Neuro:  Strength and sensation are intact Psych: euthymic mood, full affect  EKG:  EKG is ordered today. The ekg ordered today demonstrates atrial fibrillation, borderline RVR with a heart rate of 101   Recent Labs: 07/29/2015: ALT 15*; B Natriuretic Peptide 158.0*; BUN 21*; Creatinine, Ser 1.44*; Hemoglobin 16.3; Platelets 140*; Potassium 4.1; Sodium 138; TSH 1.370    Lipid Panel    Component Value Date/Time   TRIG 281* 12/26/2014 1900     Wt Readings from Last 3 Encounters:  08/16/15 208 lb 12.8 oz (94.711 kg)  07/31/15 207 lb 4.8 oz (94.031 kg)  03/18/15 207 lb (93.895 kg)     Other studies Reviewed: Additional studies/ records that were reviewed today include: Hospital records and previous office notes.  ASSESSMENT AND PLAN:  1.  Atrial fibrillation, RVR at times: The duration of the atrial fibrillation is unclear. His shortness of breath is likely secondary to this. He is not taking the Cardizem he was prescribed at discharge because he states he ran his blood pressure up. A beta blocker is appropriate because of his history of CAD. Therefore, we will start Toprol-XL 25 mg daily and follow his heart rate and blood pressure.  We feel he is symptomatic from the atrial fibrillation. The situation was discussed with Dr. Sallyanne Kuster. Since he went back into A. fib so soon after being cardioverted, and antiarrhythmic is indicated. He tolerated  amiodarone after his bypass surgery. During his recent hospitalization, a TSH and LFTs were checked and both were within normal limits. Therefore, we will start amiodarone 400 mg daily. He is to monitor his blood pressure and heart rate and record this. We will contact him and consider cardioversion if he has not spontaneously converted to sinus rhythm.  He ambulated in the office and did not get short of breath with moderate walking pace and his oxygen saturation remained greater than 92%.  2. Dyspnea on exertion: This is likely secondary to the atrial fibrillation, but may also be an anginal equivalent. Additionally, he had chest pain on admission that resolved. His cardiac enzymes were negative for MI. Stress test was to be pursued as an outpatient but the patient does not wish to do  that at this time. Once he is back in sinus rhythm, he will be willing to consider it.  3. Anticoagulation with Coumadin: His INR yesterday was 3.4. This is followed in the [place] office.   Current medicines are reviewed at length with the patient today.  The patient does not have concerns regarding medicines.  The following changes have been made:  Take diltiazem off his medication list, and add Toprol-XL  Labs/ tests ordered today include:   Orders Placed This Encounter  Procedures  . EKG 12-Lead     Disposition:   FU with Croitoru after his cardioversion unless he needs to be seen sooner  Augusto Garbe  08/16/2015 4:56 PM    [facility] [place], [place], [place]  [address] Phone: [phone]; Fax: [phone]

## 2015-08-20 ENCOUNTER — Telehealth: Payer: Self-pay | Admitting: Cardiovascular Disease

## 2015-08-20 NOTE — Telephone Encounter (Signed)
Had lengthy conversation w/ patient's daughter.  Seen on 10/27 by Rosaria Ferries for A Fib w/ RVR, prescribed Toprol XL '25mg'$  daily and Amiodarone '400mg'$  daily. Daughter explains pt read FDA label on Amiodarone and stated concern over "severe side effects" - is refusing to take. Has not taken med yet. Reviewed notes - apparently he has been on this med before, per Rosaria Ferries, after bypass surgery. Advised daughter on this.   Pt also reporting SE's w/ metoprolol - nausea since starting med. He has been compliant w/ this med but requesting alternative.  She will try to have pt take amiodarone daily, continue metoprolol for now - will call if pt refuses compliance. Informed her I would let Dr. Sallyanne Kuster know, inquire on recommendations.

## 2015-08-20 NOTE — Telephone Encounter (Signed)
Pam (daughter) is calling about medication ( Amiodarone HVL '200mg'$ ) and there is another one , but she cant remember what it is .Marland Kitchen The medication is causing nausea and the Amiodarone he is uncomfortable to take , because of the side effects . Please call   Thanks

## 2015-08-20 NOTE — Telephone Encounter (Signed)
Please explain to him that I am hopeful that amiodarone therapy will be a temporary measure (about one month before repeat cardioversion and 3 months afterward). It is true that amiodarone can have serious side effects, but they more commonly occur with long term use.Unfortunately, all available rhythm maintenance drugs can be plagued by dangerous side effects. There are two alternatives: sotalol and dofetilide, both of which would require a 3 day hospitalization with cardiac monitoring. Tell him I am in the hospital all week and we could arrange to do that, if he finds these drugs a more acceptable option, but the 72 hour hospital stay is mandatory.

## 2015-08-22 NOTE — Telephone Encounter (Signed)
2nd conversation w/ daughter. Explained at length Dr. Victorino December recommendations, advised on moving to full dose of amiodarone asap.  She voiced understanding, relays information to patient. Patient aware if remaining concerns or questions to call.

## 2015-08-24 ENCOUNTER — Telehealth: Payer: Self-pay | Admitting: Cardiovascular Disease

## 2015-08-24 NOTE — Telephone Encounter (Signed)
Pt having CP since starting Amiodarone. He has been on medication for about 3 days, has been having CP off and on. He has also been short of breath, but this was a problem prior to starting the amiodarone.   Discussed w/ Erasmo Downer- noted no concern for CP as a well-known SE of amiodarone but could possibly be related - could stop if physician directed but symptoms may not immediately diminish d/t drug half-life.  Will consult w/ Dr. Claiborne Billings on this and return call.

## 2015-08-24 NOTE — Telephone Encounter (Signed)
Pt c/o medication issue:  1. Name of Medication: Amiodarone '400mg'$  (possibly)  2. How are you currently taking this medication (dosage and times per day)? 1 tab po daily   3. Are you having a reaction (difficulty breathing--STAT)? No   4. What is your medication issue? Occasional chest pain since he has been on this medication

## 2015-08-24 NOTE — Telephone Encounter (Signed)
Spoke to Dr. Claiborne Billings regarding this patient. Indicated not really a known SE of Amiodarone to cause CP - advised w/ recent history best advice to have pt seen next week by [facility] or Dr. Sallyanne Kuster dependent on availability.  I relayed this instruction to [facility], daughter. She has also been given advice to make use of on-call provider pager if changing concerns over weekend, or ED, preferentially, dependent on symptoms.  She will call me Monday w/ patient's availability (cites that pt & wife have several doctor's appts throughout week) so that I can relay to scheduler.  After phone call, I checked schedule and made appt for open slot on Dr. Victorino December schedule Monday at 3:15p. I called family back to notify, no answer when dialed, left message on voice mail regarding this. Will f/u Monday.

## 2015-08-27 ENCOUNTER — Encounter: Payer: Self-pay | Admitting: Cardiovascular Disease

## 2015-08-27 ENCOUNTER — Ambulatory Visit (INDEPENDENT_AMBULATORY_CARE_PROVIDER_SITE_OTHER): Payer: Medicare Other | Admitting: Cardiovascular Disease

## 2015-08-27 VITALS — BP 126/78 | HR 81 | Ht 66.0 in | Wt 205.0 lb

## 2015-08-27 DIAGNOSIS — I2510 Atherosclerotic heart disease of native coronary artery without angina pectoris: Secondary | ICD-10-CM

## 2015-08-27 DIAGNOSIS — I481 Persistent atrial fibrillation: Secondary | ICD-10-CM

## 2015-08-27 DIAGNOSIS — I4819 Other persistent atrial fibrillation: Secondary | ICD-10-CM

## 2015-08-27 DIAGNOSIS — I10 Essential (primary) hypertension: Secondary | ICD-10-CM | POA: Diagnosis not present

## 2015-08-27 MED ORDER — ONDANSETRON HCL 4 MG PO TABS: 4.0000 mg | ORAL_TABLET | Freq: Three times a day (TID) | ORAL | Status: DC | PRN

## 2015-08-27 MED ORDER — METOPROLOL SUCCINATE ER 50 MG PO TB24: 50.0000 mg | ORAL_TABLET | Freq: Every day | ORAL | Status: DC

## 2015-08-27 NOTE — Progress Notes (Signed)
Patient ID: [name], male   DOB: 1935-02-18, 80 y.o.   MRN: [id number]      Cardiology Office Note   Date:  08/29/2015   ID:  [name], DOB 12/25/1934, MRN [id number]  PCP:  Leonides Grills, MD  Cardiologist:   Sanda Klein, MD   Chief Complaint  Patient presents with  . INTERMITTENT CP    his chest feels tight and sore every since he was shocked  . Shortness of Breath    when talking      History of Present Illness: [name] is a 80 y.o. male with a history of CABG in 11/2005 (LIMA to LAD, SVG to diagonal, SVG to ramus, and SVG to RCA), paroxysmal atrial fibrillation, and hypertension who presents for follow up after recently presenting with early recurrence of atrial fibrillation with RVR after DC cardioversion (Oct 11) and being evaluated by Rosaria Ferries. Amiodarone was recommended, but he balked at the list of side effects and has not started it. Rate control is much better now and he feels improved. At home, HR is never >100, usually around 80. He is taking a low dose beta blocker. He is unaware of the irregular rhythm.  He is very hoarse and has a raspy, breathy voice. He has completed 2 courses of antibiotics and received steroids, without real improvement. He is no longer complaining of dyspnea as much. He does complain of nausea. GERD is a possibility.    Past Medical History  Diagnosis Date  . Hypertension   . Coronary artery disease   . Atrial fibrillation ([place]) 07/2015    Past Surgical History  Procedure Laterality Date  . Coronary artery bypass graft  12/10/2005    LIMA to LAD,SVG to diagonal,SVG to ramus intermediate,SVG to distal RCA.  . Tonsillectomy    . Tee without cardioversion  05/10/2012    Procedure: TRANSESOPHAGEAL ECHOCARDIOGRAM (TEE);  Surgeon: [name], MD;  Location: [facility] ENDOSCOPY;  Service: Cardiovascular;  Laterality: N/A;  . Cardioversion  05/10/2012    Procedure: CARDIOVERSION;  Surgeon: [name], MD;   Location: [facility] ENDOSCOPY;  Service: Cardiovascular;  Laterality: N/A;  . Cardiac catheterization  10/28/2005    significant 3 vessel CAD  . Nm myocar perf wall motion  09/16/2005    evidence of diaphragmatic attenuation & subtle anterolateral ischemia.  . Cardioversion N/A 07/31/2015    Procedure: CARDIOVERSION;  Surgeon: Josue Hector, MD;  Location: [facility] ENDOSCOPY;  Service: Cardiovascular;  Laterality: N/A;     Current Outpatient Prescriptions  Medication Sig Dispense Refill  . aspirin EC 81 MG tablet Take 81 mg by mouth daily.    . cetirizine (ZYRTEC) 10 MG tablet Take 10 mg by mouth daily.    Marland Kitchen lisinopril-hydrochlorothiazide (PRINZIDE,ZESTORETIC) 20-25 MG tablet Take 0.5 tablets by mouth daily.    . metoprolol succinate (TOPROL-XL) 50 MG 24 hr tablet Take 1 tablet (50 mg total) by mouth daily. 90 tablet 3  . Tamsulosin HCl (FLOMAX) 0.4 MG CAPS Take 0.4 mg by mouth daily after supper.    . warfarin (COUMADIN) 5 MG tablet Take 1 tablet by mouth daily or as directed by coumadin clinic.  Need MD appt for further refills (Patient taking differently: Take 2.5-5 mg by mouth daily after supper. Take 1/2 tablet (2.5 mg) by mouth on Monday, Wednesday, Friday, take 1 tablet (5 mg) on Sunday, Tuesday, Thursday, Saturday or as directed by coumadin clinic) 90 tablet 0  . ondansetron (ZOFRAN) 4 MG tablet Take  1 tablet (4 mg total) by mouth every 8 (eight) hours as needed for nausea or vomiting. 30 tablet 0   No current facility-administered medications for this visit.    Allergies:   Clonidine derivatives; Diltiazem hcl er; Statins; and Sulfa antibiotics    Social History:  The patient  reports that he has quit smoking. He does not have any smokeless tobacco history on file. He reports that he does not drink alcohol or use illicit drugs.   Family History:  The patient's family history includes Cancer (age of onset: 2) in his sister; Heart attack (age of onset: 31) in his brother; Heart attack (age of  onset: 61) in his father; Stroke (age of onset: 10) in his mother; Stroke (age of onset: 55) in his father.    ROS:  Please see the history of present illness.    Otherwise, review of systems positive for none.   All other systems are reviewed and negative.    PHYSICAL EXAM: VS:  BP 126/78 mmHg  Pulse 81  Ht '5\' 6"'$  (1.676 m)  Wt 205 lb (92.987 kg)  BMI 33.10 kg/m2 , BMI Body mass index is 33.1 kg/(m^2).  General: Alert, oriented x3, no distress; whispery, bitonal voice Head: no evidence of trauma, PERRL, EOMI, no exophtalmos or lid lag, no myxedema, no xanthelasma; normal ears, nose and oropharynx Neck: normal jugular venous pulsations and no hepatojugular reflux; brisk carotid pulses without delay and no carotid bruits Chest: clear to auscultation, no signs of consolidation by percussion or palpation, normal fremitus, symmetrical and full respiratory excursions, sternotomy scar Cardiovascular: normal position and quality of the apical impulse, irregular rhythm, normal first and second heart sounds, no murmurs, rubs or gallops Abdomen: no tenderness or distention, no masses by palpation, no abnormal pulsatility or arterial bruits, normal bowel sounds, no hepatosplenomegaly Extremities: no clubbing, cyanosis or edema; 2+ radial, ulnar and brachial pulses bilaterally; 2+ right femoral, posterior tibial and dorsalis pedis pulses; 2+ left femoral, posterior tibial and dorsalis pedis pulses; no subclavian or femoral bruits Neurological: grossly nonfocal Psych: euthymic mood, full affect   EKG:  EKG is ordered today. The ekg ordered today demonstrates coarse atrial fibrillation, V rate 81, PRWP V1-V4   Recent Labs: 07/29/2015: ALT 15*; B Natriuretic Peptide 158.0*; BUN 21*; Creatinine, Ser 1.44*; Hemoglobin 16.3; Platelets 140*; Potassium 4.1; Sodium 138; TSH 1.370    Lipid Panel    Component Value Date/Time   TRIG 281* 12/26/2014 1900      Wt Readings from Last 3 Encounters:    08/27/15 205 lb (92.987 kg)  08/16/15 208 lb 12.8 oz (94.711 kg)  07/31/15 207 lb 4.8 oz (94.031 kg)      ASSESSMENT AND PLAN:  1. Persistent atrial fibrillation with early recurrence after DCCV The risk of amiodarone may indeed not be justified to treat what now appears to be only a mildly symptomatic arrhythmia, especially at age 36. Will switch to a rate control strategy. Continue anticoagulation.  2. CAD with a history of CABG in 11/2005 (LIMA to LAD, SVG to diagonal, SVG to ramus, and SVG to RCA). Asymptomatic  3. HTN, well controlled  4. Hyperlipidemia, statin intolerant  5. Persistent hoarseness - illness in early October did start with a cough and sore throat, but his hoarseness is very persistent and his voice raises concern for unilateral vocal cord paralysis. I recommended ENT evaluation (he has an ENT in [place]).    Current medicines are reviewed at length with the patient today.  The patient has concerns regarding medicines.   Labs/ tests ordered today include:  Orders Placed This Encounter  Procedures  . EKG 12-Lead    Patient Instructions  Your physician has recommended you make the following change in your medication:   STOP AMIODARONE  INCREASE METOPROLOL TO 50 MG DAILY (A NEW rX HAS BEEN SENT TO YOUR PHARMACY)  TAKE ZOFRAN 4 MG EVERY 8 HOURS AS NEEDED FOR NAUSEA ( A RX HAS BEEN SENT TO YOUR PHARMACY)    SEE YOUR ENT IN [place] FOR ASSESSMENT OF YOUR HOARSENESS  Dr. Sallyanne Kuster recommends that you schedule a follow-up appointment in: 4-6 WEEKS        Signed, Yolandra Habig, MD  08/29/2015 11:02 PM    Sanda Klein, MD, [facility] [phone] office [phone] pager

## 2015-08-27 NOTE — Patient Instructions (Signed)
Your physician has recommended you make the following change in your medication:   STOP AMIODARONE  INCREASE METOPROLOL TO 50 MG DAILY (A NEW rX HAS BEEN SENT TO YOUR PHARMACY)  TAKE ZOFRAN 4 MG EVERY 8 HOURS AS NEEDED FOR NAUSEA ( A RX HAS BEEN SENT TO YOUR PHARMACY)    SEE YOUR ENT IN [place] FOR ASSESSMENT OF YOUR HOARSENESS  Dr. Sallyanne Kuster recommends that you schedule a follow-up appointment in: 4-6 WEEKS

## 2015-08-27 NOTE — Telephone Encounter (Signed)
Called pt contact --- Confirmed appt this afternoon w/ Dr. Sallyanne Kuster

## 2015-08-29 ENCOUNTER — Ambulatory Visit (INDEPENDENT_AMBULATORY_CARE_PROVIDER_SITE_OTHER): Payer: Medicare Other | Admitting: *Deleted

## 2015-08-29 DIAGNOSIS — Z5181 Encounter for therapeutic drug level monitoring: Secondary | ICD-10-CM

## 2015-08-29 DIAGNOSIS — I2510 Atherosclerotic heart disease of native coronary artery without angina pectoris: Secondary | ICD-10-CM | POA: Insufficient documentation

## 2015-08-29 DIAGNOSIS — I4819 Other persistent atrial fibrillation: Secondary | ICD-10-CM | POA: Insufficient documentation

## 2015-08-29 DIAGNOSIS — I4891 Unspecified atrial fibrillation: Secondary | ICD-10-CM | POA: Diagnosis not present

## 2015-08-29 LAB — POCT INR: INR: 2.6

## 2015-09-07 ENCOUNTER — Other Ambulatory Visit: Payer: Self-pay | Admitting: Otolaryngology

## 2015-09-07 DIAGNOSIS — R079 Chest pain, unspecified: Secondary | ICD-10-CM

## 2015-09-07 DIAGNOSIS — J3801 Paralysis of vocal cords and larynx, unilateral: Secondary | ICD-10-CM

## 2015-09-10 ENCOUNTER — Ambulatory Visit (INDEPENDENT_AMBULATORY_CARE_PROVIDER_SITE_OTHER): Payer: Medicare Other | Admitting: *Deleted

## 2015-09-10 DIAGNOSIS — Z5181 Encounter for therapeutic drug level monitoring: Secondary | ICD-10-CM

## 2015-09-10 DIAGNOSIS — I4891 Unspecified atrial fibrillation: Secondary | ICD-10-CM | POA: Diagnosis not present

## 2015-09-10 LAB — POCT INR: INR: 4.2

## 2015-09-14 ENCOUNTER — Ambulatory Visit
Admission: RE | Admit: 2015-09-14 | Discharge: 2015-09-14 | Disposition: A | Discharge status: Home / Self Care | Payer: Medicare Other | Source: Ambulatory Visit | Attending: Otolaryngology | Admitting: Otolaryngology

## 2015-09-14 DIAGNOSIS — K769 Liver disease, unspecified: Secondary | ICD-10-CM | POA: Diagnosis not present

## 2015-09-14 DIAGNOSIS — J189 Pneumonia, unspecified organism: Secondary | ICD-10-CM | POA: Diagnosis not present

## 2015-09-14 DIAGNOSIS — C3412 Malignant neoplasm of upper lobe, left bronchus or lung: Secondary | ICD-10-CM | POA: Diagnosis not present

## 2015-09-14 DIAGNOSIS — R911 Solitary pulmonary nodule: Secondary | ICD-10-CM | POA: Insufficient documentation

## 2015-09-14 DIAGNOSIS — R079 Chest pain, unspecified: Secondary | ICD-10-CM

## 2015-09-14 DIAGNOSIS — J3801 Paralysis of vocal cords and larynx, unilateral: Secondary | ICD-10-CM | POA: Insufficient documentation

## 2015-09-14 DIAGNOSIS — R590 Localized enlarged lymph nodes: Secondary | ICD-10-CM | POA: Insufficient documentation

## 2015-09-14 DIAGNOSIS — J90 Pleural effusion, not elsewhere classified: Secondary | ICD-10-CM | POA: Insufficient documentation

## 2015-09-14 LAB — POCT I-STAT CREATININE: CREATININE: 1.5 mg/dL — AB (ref 0.61–1.24)

## 2015-09-14 MED ORDER — IOHEXOL 300 MG/ML  SOLN
75.0000 mL | Freq: Once | INTRAMUSCULAR | Status: AC | PRN
  Administered 2015-09-14: 60 mL via INTRAVENOUS

## 2015-09-17 ENCOUNTER — Other Ambulatory Visit: Payer: Self-pay | Admitting: Otolaryngology

## 2015-09-17 DIAGNOSIS — R918 Other nonspecific abnormal finding of lung field: Secondary | ICD-10-CM

## 2015-09-19 ENCOUNTER — Ambulatory Visit
Admission: RE | Admit: 2015-09-19 | Discharge: 2015-09-19 | Disposition: A | Discharge status: Home / Self Care | Payer: Medicare Other | Source: Ambulatory Visit | Attending: Otolaryngology | Admitting: Otolaryngology

## 2015-09-19 ENCOUNTER — Ambulatory Visit (INDEPENDENT_AMBULATORY_CARE_PROVIDER_SITE_OTHER): Payer: Medicare Other | Admitting: *Deleted

## 2015-09-19 DIAGNOSIS — M899 Disorder of bone, unspecified: Secondary | ICD-10-CM | POA: Diagnosis not present

## 2015-09-19 DIAGNOSIS — R918 Other nonspecific abnormal finding of lung field: Secondary | ICD-10-CM | POA: Insufficient documentation

## 2015-09-19 DIAGNOSIS — I4891 Unspecified atrial fibrillation: Secondary | ICD-10-CM | POA: Diagnosis not present

## 2015-09-19 DIAGNOSIS — R590 Localized enlarged lymph nodes: Secondary | ICD-10-CM | POA: Insufficient documentation

## 2015-09-19 DIAGNOSIS — Z5181 Encounter for therapeutic drug level monitoring: Secondary | ICD-10-CM | POA: Diagnosis not present

## 2015-09-19 DIAGNOSIS — Z0189 Encounter for other specified special examinations: Secondary | ICD-10-CM | POA: Diagnosis present

## 2015-09-19 LAB — GLUCOSE, CAPILLARY: GLUCOSE-CAPILLARY: 118 mg/dL — AB (ref 65–99)

## 2015-09-19 LAB — POCT INR: INR: 3.6

## 2015-09-19 MED ORDER — FLUDEOXYGLUCOSE F - 18 (FDG) INJECTION
12.6800 | Freq: Once | INTRAVENOUS | Status: AC | PRN
  Administered 2015-09-19: 12.68 via INTRAVENOUS

## 2015-09-20 ENCOUNTER — Encounter: Payer: Self-pay | Admitting: Oncology

## 2015-09-20 ENCOUNTER — Inpatient Hospital Stay: Payer: Medicare Other | Attending: Cardiothoracic Surgery | Admitting: Cardiothoracic Surgery

## 2015-09-20 ENCOUNTER — Encounter: Payer: Self-pay | Admitting: *Deleted

## 2015-09-20 ENCOUNTER — Inpatient Hospital Stay: Payer: Medicare Other

## 2015-09-20 ENCOUNTER — Inpatient Hospital Stay (HOSPITAL_BASED_OUTPATIENT_CLINIC_OR_DEPARTMENT_OTHER): Payer: Medicare Other | Admitting: Oncology

## 2015-09-20 VITALS — BP 124/69 | HR 94 | Temp 96.9°F | Wt 201.1 lb

## 2015-09-20 DIAGNOSIS — C7989 Secondary malignant neoplasm of other specified sites: Secondary | ICD-10-CM | POA: Diagnosis not present

## 2015-09-20 DIAGNOSIS — R350 Frequency of micturition: Secondary | ICD-10-CM | POA: Diagnosis not present

## 2015-09-20 DIAGNOSIS — R5383 Other fatigue: Secondary | ICD-10-CM | POA: Insufficient documentation

## 2015-09-20 DIAGNOSIS — R531 Weakness: Secondary | ICD-10-CM

## 2015-09-20 DIAGNOSIS — Z79899 Other long term (current) drug therapy: Secondary | ICD-10-CM | POA: Diagnosis not present

## 2015-09-20 DIAGNOSIS — R630 Anorexia: Secondary | ICD-10-CM | POA: Insufficient documentation

## 2015-09-20 DIAGNOSIS — Z7901 Long term (current) use of anticoagulants: Secondary | ICD-10-CM | POA: Diagnosis not present

## 2015-09-20 DIAGNOSIS — R490 Dysphonia: Secondary | ICD-10-CM | POA: Diagnosis not present

## 2015-09-20 DIAGNOSIS — J90 Pleural effusion, not elsewhere classified: Secondary | ICD-10-CM | POA: Insufficient documentation

## 2015-09-20 DIAGNOSIS — R05 Cough: Secondary | ICD-10-CM | POA: Diagnosis not present

## 2015-09-20 DIAGNOSIS — R918 Other nonspecific abnormal finding of lung field: Secondary | ICD-10-CM | POA: Insufficient documentation

## 2015-09-20 DIAGNOSIS — Z7982 Long term (current) use of aspirin: Secondary | ICD-10-CM | POA: Insufficient documentation

## 2015-09-20 DIAGNOSIS — Z951 Presence of aortocoronary bypass graft: Secondary | ICD-10-CM | POA: Insufficient documentation

## 2015-09-20 DIAGNOSIS — I4891 Unspecified atrial fibrillation: Secondary | ICD-10-CM | POA: Diagnosis not present

## 2015-09-20 DIAGNOSIS — Z87891 Personal history of nicotine dependence: Secondary | ICD-10-CM

## 2015-09-20 DIAGNOSIS — I2510 Atherosclerotic heart disease of native coronary artery without angina pectoris: Secondary | ICD-10-CM | POA: Insufficient documentation

## 2015-09-20 DIAGNOSIS — R0602 Shortness of breath: Secondary | ICD-10-CM

## 2015-09-20 DIAGNOSIS — J3801 Paralysis of vocal cords and larynx, unilateral: Secondary | ICD-10-CM | POA: Diagnosis not present

## 2015-09-20 DIAGNOSIS — R112 Nausea with vomiting, unspecified: Secondary | ICD-10-CM | POA: Insufficient documentation

## 2015-09-20 DIAGNOSIS — Z5111 Encounter for antineoplastic chemotherapy: Secondary | ICD-10-CM | POA: Insufficient documentation

## 2015-09-20 DIAGNOSIS — M549 Dorsalgia, unspecified: Secondary | ICD-10-CM | POA: Insufficient documentation

## 2015-09-20 DIAGNOSIS — R229 Localized swelling, mass and lump, unspecified: Secondary | ICD-10-CM

## 2015-09-20 DIAGNOSIS — C3412 Malignant neoplasm of upper lobe, left bronchus or lung: Secondary | ICD-10-CM | POA: Diagnosis not present

## 2015-09-20 DIAGNOSIS — C7951 Secondary malignant neoplasm of bone: Secondary | ICD-10-CM

## 2015-09-20 DIAGNOSIS — R0789 Other chest pain: Secondary | ICD-10-CM | POA: Insufficient documentation

## 2015-09-20 NOTE — Progress Notes (Signed)
[place] @ [facility] [phone]  Fax:(336) [place]: 1934-12-08  MR#: [id number]  JYN#:[id number]  Patient Care Team: Provider Not In System as PCP - General  CHIEF COMPLAINT: Oncology history Abnormal CT scan of the chest (November, 2016) Irregular 3.2 x 2.5 left upper lobe lung mass.  Ipsilateral peribronchial and ipsilateral hilar lymphadenopathy.  Right upper lobe supper solid pulmonary nodule.  Multiple pulmonary nodules stress pleural effusion.  PET scan shows multiple bone metastases  Chief Complaint  Patient presents with  . Lung Mass    VISIT DIAGNOSIS:     ICD-9-CM ICD-10-CM   1. Lung mass 786.6 R91.8 CBC with Differential     Comprehensive metabolic panel     Protime-INR     US Biopsy  2. Atrial fibrillation with tachycardic ventricular rate (HCC) 427.31 I48.91 CBC with Differential     Comprehensive metabolic panel     Protime-INR  3. Subcutaneous nodule 782.2 R22.9 US Biopsy      No history exists.    Oncology Flowsheet 05/05/2012 05/05/2012 12/26/2014  ondansetron (ZOFRAN) IV 4 mg 4 mg 4 mg    INTERVAL HISTORY:  80 year old gentleman started having discomfort in the chest.  Patient went to emergency room in [facility] followed by [facility]  Multiple admission.  Patient has a history of atrial fibrillation underwent several times for defibrillation.  Without much success on chronic Coumadin therapy.  Patient continues to complain of dry hacking cough.  ,hoarseness of voice. Patient was evaluated by ENT surgeon  Dr. Pryor Ochoa and the left vocal cord paralysis was found. CT scan was ordered which revealed a lung mass and PET scan revealed multiple bone metastases.  Patient was referred to me for further evaluation and treatment consideration  REVIEW OF SYSTEMS:   Gen. status: Patient is feeling weak and tired. HEENT: Hoarseness of voice. Lungs: Shortness of breath.  Dry hacking cough.  Patient  has a squeezing pain in both chest wall area. GI: Poor appetite.  No nausea no vomiting no diarrhea. GU: No dysuria hematuria Skin: No rash Neurological system no headache no dizziness.  No other focal symptoms Musculoskeletal system back pain. Lower extremity intermittent swelling Cardiac history of atrial fibrillation on chronic Coumadin therapy Psychiatric system: No evidence of abnormality  As per HPI. Otherwise, a complete review of systems is negatve.  PAST MEDICAL HISTORY: Past Medical History  Diagnosis Date  . Hypertension   . Coronary artery disease   . Atrial fibrillation ([place]) 07/2015    PAST SURGICAL HISTORY: Past Surgical History  Procedure Laterality Date  . Coronary artery bypass graft  12/10/2005    LIMA to LAD,SVG to diagonal,SVG to ramus intermediate,SVG to distal RCA.  . Tonsillectomy    . Tee without cardioversion  05/10/2012    Procedure: TRANSESOPHAGEAL ECHOCARDIOGRAM (TEE);  Surgeon: [name], MD;  Location: [facility] ENDOSCOPY;  Service: Cardiovascular;  Laterality: N/A;  . Cardioversion  05/10/2012    Procedure: CARDIOVERSION;  Surgeon: [name], MD;  Location: [facility] ENDOSCOPY;  Service: Cardiovascular;  Laterality: N/A;  . Cardiac catheterization  10/28/2005    significant 3 vessel CAD  . Nm myocar perf wall motion  09/16/2005    evidence of diaphragmatic attenuation & subtle anterolateral ischemia.  . Cardioversion N/A 07/31/2015    Procedure: CARDIOVERSION;  Surgeon: Josue Hector, MD;  Location: [facility] ENDOSCOPY;  Service: Cardiovascular;  Laterality: N/A;    FAMILY HISTORY Family History  Problem Relation Age  of Onset  . Stroke Mother 72  . Stroke Father 26  . Heart attack Father 41  . Heart attack Brother 58  . Cancer Sister 99       ADVANCED DIRECTIVES:  Patient does have advance healthcare directive, Patient   does not desire to make any changes  HEALTH MAINTENANCE: Social History  Substance Use Topics  . Smoking status: Former  Smoker -- 1.00 packs/day for 40 years    Types: Cigarettes    Quit date: 09/19/2005  . Smokeless tobacco: Never Used  . Alcohol Use: No      Allergies  Allergen Reactions  . Clonidine Derivatives Other (See Comments)    Pt was not aware of a reaction to this medication  . Diltiazem Hcl Er Other (See Comments)    High blood pressure  . Statins Other (See Comments)    Myalgias:  Previously on Crestor,Vytorin,Simvastatin,Lipitor & Pravastatin  . Sulfa Antibiotics Swelling    Current Outpatient Prescriptions  Medication Sig Dispense Refill  . aspirin EC 81 MG tablet Take 81 mg by mouth daily.    Marland Kitchen lisinopril-hydrochlorothiazide (PRINZIDE,ZESTORETIC) 20-25 MG tablet Take 0.5 tablets by mouth daily.    . Tamsulosin HCl (FLOMAX) 0.4 MG CAPS Take 0.4 mg by mouth daily after supper.    . warfarin (COUMADIN) 5 MG tablet Take 1 tablet by mouth daily or as directed by coumadin clinic.  Need MD appt for further refills (Patient taking differently: Take 2.5-5 mg by mouth daily after supper. Take 1/2 tablet (2.5 mg) by mouth on Monday, Wednesday, Friday, take 1 tablet (5 mg) on Sunday, Tuesday, Thursday, Saturday or as directed by coumadin clinic) 90 tablet 0  . metoprolol succinate (TOPROL-XL) 50 MG 24 hr tablet Take 1 tablet (50 mg total) by mouth daily. (Patient not taking: Reported on 09/20/2015) 90 tablet 3  . ondansetron (ZOFRAN) 4 MG tablet Take 1 tablet (4 mg total) by mouth every 8 (eight) hours as needed for nausea or vomiting. (Patient not taking: Reported on 09/20/2015) 30 tablet 0   No current facility-administered medications for this visit.    OBJECTIVE: PHYSICAL EXAM: Gen. status: Patient is alert oriented not any acute distress Lymphatic system: Supraclavicular, cervical, axillary, inguinal lymph nodes are not palpable HEENT: No evidence of abnormality Lungs: Emphysematous chest diminished air entry on both sides. Cardiac: Irregular heart sounds GI: Abdomen is soft liver spleen  not palpable there is palpable nodule in mid abdominal area 2 cm x 1 cm Lower extremity no edema Skin: No rash Neurological system: Higher functions within normal limit.  Cranial nodes are intact.  No other focal signs Psychiatric system within normal limit.    Filed Vitals:   09/20/15 1321  BP: 124/69  Pulse: 94  Temp: 96.9 F (36.1 C)     Body mass index is 32.47 kg/(m^2).    ECOG FS:1 - Symptomatic but completely ambulatory  LAB RESULTS:  Anti-coag visit on 09/19/2015  Component Date Value Ref Range Status  . INR 09/19/2015 3.6   Final  Hospital Outpatient Visit on 09/19/2015  Component Date Value Ref Range Status  . Glucose-Capillary 09/19/2015 118* 65 - 99 mg/dL Final     STUDIES: Ct Soft Tissue Neck W Contrast  09/14/2015  CLINICAL DATA:  Vocal cord paralysis after cardioversion 1 month ago. Right-sided chest pain. EXAM: CT NECK WITH CONTRAST TECHNIQUE: Multidetector CT imaging of the neck was performed using the standard protocol following the bolus administration of intravenous contrast. CONTRAST:  12m OMNIPAQUE IOHEXOL  300 MG/ML  SOLN COMPARISON:  None. FINDINGS: Pharynx and larynx: No evidence of mucosal or submucosal lesion. There are findings consistent with vocal cord paresis on the left with a "Sail sign" . No definable laryngeal mass, though a small mass or polyp of the left vocal cord could simulate this appearance. Salivary glands: Normal appearance of the parotid and submandibular glands. Thyroid: Normal Lymph nodes: No enlarged or low-density nodes on either side of the neck. Vascular: Jugular veins are patent with the left being dominant. There is atherosclerosis of the aorta and of the carotid bifurcation regions. This study was not done in the form of a a CT angiogram, but stenosis in both ICA regions approaches 50% with soft and calcified plaque. Limited intracranial: Normal Visualized orbits: Limited visualization common normal Mastoids and visualized paranasal  sinuses: Clear Skeleton: Normal Upper chest: See results of chest CT. Worrisome for 3 cm mass in the left upper lobe with abnormal soft tissue in the left superior mediastinum that could represent metastatic disease, possibly affecting the recurrent laryngeal nerve. 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 Left upper lobe lung mass with changes worrisome for metastatic disease to the mediastinum which could affect the recurrent laryngeal nerve on the left. See results of chest CT. Electronically Signed   By: Nelson Chimes M.D.   On: 09/14/2015 09:53   Ct Chest W Contrast  09/14/2015  CLINICAL DATA:  Right chest pain. Recent defibrillation for arrhythmia. EXAM: CT CHEST WITH CONTRAST TECHNIQUE: Multidetector CT imaging of the chest was performed during intravenous contrast administration. CONTRAST:  56m OMNIPAQUE IOHEXOL 300 MG/ML  SOLN COMPARISON:  07/29/2015 chest radiograph FINDINGS: Mediastinum/Nodes: Mild cardiomegaly. No pericardial fluid/thickening. Left main, left anterior descending, left circumflex and right coronary atherosclerosis. Great vessels are normal in course and caliber. No central pulmonary emboli. Normal visualized thyroid. Normal esophagus. No axillary adenopathy. There is a mildly enlarged 1.4 cm subcarinal node (series 3/ image 29). There is a mildly enlarged 1.1 cm right paratracheal node (3/23). There is confluent left hilar and ipsilateral left upper lobe peribronchial adenopathy, for example a mildly enlarged 1.4 cm left hilar node (3/25). There is irregular infiltrative aortopulmonary window and left prevascular mediastinal adenopathy, which is confluent with the left hilar adenopathy, for example a 2.5 cm enlarged AP window node (3/22). No right hilar adenopathy. Lungs/Pleura: No pneumothorax. There is a trace left pleural effusion. No right pleural effusion. There is an irregular 3.2 x 2.5 cm posterior medial left upper lobe lung mass (series 4/ image 17) abutting  and distorting the superior aspect of the left major fissure and abutting the mediastinal pleural surface, with occlusion of the associated segmental left upper lobe airway. There is a 0.7 cm anterior medial right upper lobe solid pulmonary nodule (series 4/ image 23). There is a subsolid 2.3 x 1.5 cm right upper lobe pulmonary nodule (4/28) with a 5 mm solid component. There is a 4 mm solid left upper lobe pulmonary nodule (4/18). There is an 8 mm solid pulmonary nodule in the lingula (4/39). There is mild patchy ground-glass opacity throughout the left upper lobe. There is a 3 mm left lower lobe solid pulmonary nodule (4/45). Upper abdomen: Simple 2.3 cm left liver lobe cyst. There are at least 4 subcentimeter hypodense lesions scattered throughout the visualized liver, too small to characterize. Musculoskeletal: No aggressive appearing focal osseous lesions. Mild degenerative changes in the thoracic spine. There is a mildly displaced subacute lateral right seventh rib fracture with slight  periosteal callus formation suggesting early healing response. No definite bone lesion is seen at the fracture site. IMPRESSION: 1. Irregular 3.2 x 2.5 cm posteromedial left upper lobe lung mass abutting the left major fissure and mediastinal pleural surface, with occlusion of the associated segmental left upper lobe airway, in keeping with a primary bronchogenic carcinoma. Scattered ground-glass opacity in the left upper lobe, in keeping with mild postobstructive atelectasis/pneumonitis. 2. Ipsilateral peribronchial and ipsilateral hilar lymphadenopathy. Aortopulmonary window, subcarinal and contralateral paratracheal mediastinal lymphadenopathy. 3. Right upper lobe subsolid 2.3 x 1.5 cm pulmonary nodule, indeterminate, cannot exclude a synchronous primary bronchogenic carcinoma versus contralateral metastasis. 4. Four additional subcentimeter solid pulmonary nodules in both lungs, cannot exclude pulmonary metastases. 5. Trace  left pleural effusion. 6. At least 4 scattered subcentimeter hypodense liver lesions, too small to characterize, liver metastases not excluded. 7. Subacute mildly displaced lateral right seventh rib fracture. No definite pathologic bone lesions detected. 8. Recommend thoracic surgical and oncology consultation and further staging with PET-CT. These results will be called to the ordering clinician or representative by the Radiologist Assistant, and communication documented in the PACS or zVision Dashboard. Electronically Signed   By: Ilona Sorrel M.D.   On: 09/14/2015 10:54   Nm Pet Image Initial (pi) Skull Base To Thigh  09/19/2015  CLINICAL DATA:  Initial treatment strategy for lung mass. EXAM: NUCLEAR MEDICINE PET SKULL BASE TO THIGH TECHNIQUE: 12.7 mCi F-18 FDG was injected intravenously. Full-ring PET imaging was performed from the skull base to thigh after the radiotracer. CT data was obtained and used for attenuation correction and anatomic localization. FASTING BLOOD GLUCOSE:  Value: 118 mg/dl COMPARISON:  Chest CT 09/14/2015. CT the abdomen and pelvis 12/27/2014. Neck CT 09/14/2015. FINDINGS: NECK No hypermetabolic lymph nodes in the neck. CHEST 2.8 x 2.8 cm left upper lobe nodule is hypermetabolic (SUVmax = 9.9), and makes contact with the overlying pleura. There is extensive hypermetabolic left hilar lymphadenopathy (SUVmax = 16 point to), which is contiguous with multiple adjacent nonenlarged and mildly enlarged but very hypermetabolic mediastinal lymph nodes throughout the prevascular, low left paratracheal and subcarinal nodal stations. Subcarinal lymph node measures 13 mm in short axis (SUVmax = 10.1). There is also nonenlarged but hypermetabolic right hilar lymphadenopathy (SUVmax = 8.3). Nonenlarged but hypermetabolic superior mediastinal and left supraclavicular lymph nodes are also noted (SUVmax = 6.0). 8 mm nodule in the anterior aspect of the right upper lobe (image 92 of series 4) is also a  hypermetabolic (SUVmax = 4.4), presumably metastatic. Heart size is mildly enlarged with right atrial dilatation. There is no significant pericardial fluid, thickening or pericardial calcification. There is atherosclerosis of the thoracic aorta, the great vessels of the mediastinum and the coronary arteries, including calcified atherosclerotic plaque in the left main, left anterior descending, left circumflex and right coronary arteries. Status post median sternotomy for CABG, including LIMA to the LAD. Calcifications of the aortic valve. ABDOMEN/PELVIS Small focus of hypermetabolism (SUVmax = 6.6)in segment 7 of the liver without corresponding CT abnormality. 1.4 cm left adrenal nodule is hypermetabolic (SUVmax = 4.4). No abnormal hypermetabolic activity within the pancreas, right adrenal gland, or spleen. No hypermetabolic lymph nodes in the abdomen or pelvis. SKELETON Multiple areas of hypermetabolism in the skeletal musculature, compatible with soft tissue metastasis, including a lesion in the right subscapularis muscle (SUVmax = 7.6), and a lesion in the left anterior abdominal wall musculature (SUVmax = 7.2). In addition, there are innumerable areas of hypermetabolism throughout the visualized axial and appendicular skeleton.  The majority of these lesions are occult by CT imaging. Some of these lesions include an expansile lesion with minimally displaced pathologic fracture (SUVmax = 8.7) in the lateral aspect of the right seventh rib (image 111 of series 4), a large diffusely infiltrative hypermetabolic (SUVmax = 04.8)GQBVQX in L1, and a large lesion in the left ilium (SUVmax = 17.9). Multiple other lesions are noted, including bilateral femoral neck lesions. IMPRESSION: 1. 2.8 cm hypermetabolic left upper lobe pulmonary nodule with bilateral hilar and mediastinal lymphadenopathy, metastatic lesions in right upper lobe of the lung, the segment 7 of the liver and in the left adrenal gland, multiple skeletal  muscle metastasis, as well as metastatic lesions throughout all aspects of the visualized axial and appendicular skeleton, as discussed above, most likely to represent stage IV lung cancer. 2. Additional incidental findings, as above. Electronically Signed   By: Vinnie Langton M.D.   On: 09/19/2015 11:53    ASSESSMENT: CT scan of the chest and PET scan has been reviewed independently and reviewed with the patient. Clinical suspicion is carcinoma of lung which is T2 N2 M1 disease based on PET scan. Vocal cord paralysis most likely due to mediastinal mass I had prolonged discussion with patient and family regarding nature of disease. We also contacted interventional radiologist to see if abdominal mass can be biopsied. Abdominal nodule is very suspicious for malignancy.  After discussing with interventional radiologist Dr. Kathlene Cote was decided patient may not have to come off warfarin for this superficial biopsy. If he cannot uptake diagnosis by this procedure patient may need   EBUS  Patient was accompanied by his wife and daughter and granddaughter and then remonstrated understanding. Patient will be reevaluated after needle biopsy result is available.  PLAN:  No problem-specific assessment & plan notes found for this encounter.   Patient expressed understanding and was in agreement with this plan. He also understands that He can call clinic at any time with any questions, concerns, or complaints.    No matching staging information was found for the patient.  Forest Gleason, MD   09/20/2015 7:34 PM

## 2015-09-21 NOTE — Progress Notes (Signed)
Oncology Nurse Navigator Documentation  Oncology Nurse Navigator Flowsheets 09/21/2015  Navigator Encounter Type Initial MedOnc  Patient Visit Type Medonc  Treatment Phase Abnormal Scans   Met with patient at initial medical oncology appointment. Introduced Programmer, multimedia and reviewed plan of care. Will follow.

## 2015-09-24 ENCOUNTER — Other Ambulatory Visit: Payer: Self-pay | Admitting: Radiology

## 2015-09-24 ENCOUNTER — Ambulatory Visit (INDEPENDENT_AMBULATORY_CARE_PROVIDER_SITE_OTHER): Payer: Medicare Other | Admitting: Cardiovascular Disease

## 2015-09-24 VITALS — BP 102/76 | HR 88 | Ht 66.0 in | Wt 201.5 lb

## 2015-09-24 DIAGNOSIS — I2510 Atherosclerotic heart disease of native coronary artery without angina pectoris: Secondary | ICD-10-CM | POA: Diagnosis not present

## 2015-09-24 DIAGNOSIS — I4891 Unspecified atrial fibrillation: Secondary | ICD-10-CM | POA: Diagnosis not present

## 2015-09-24 MED ORDER — LISINOPRIL 10 MG PO TABS: 10.0000 mg | ORAL_TABLET | Freq: Every day | ORAL | Status: DC

## 2015-09-24 NOTE — Patient Instructions (Signed)
Your physician has recommended you make the following change in your medication:   STOP ZESTORETIC (LISINOPRIL HCTZ)  START LISINOPRIL 10 MG DAILY.  THIS RX HAS BEEN SENT TO YOUR PHARMACY  Dr. Sallyanne Kuster recommends that you schedule a follow-up appointment in: [place]

## 2015-09-25 ENCOUNTER — Inpatient Hospital Stay: Payer: Medicare Other

## 2015-09-25 ENCOUNTER — Encounter: Payer: Self-pay | Admitting: Cardiovascular Disease

## 2015-09-25 ENCOUNTER — Ambulatory Visit
Admission: RE | Admit: 2015-09-25 | Discharge: 2015-09-25 | Disposition: A | Discharge status: Home / Self Care | Payer: Medicare Other | Source: Ambulatory Visit | Attending: Oncology | Admitting: Oncology

## 2015-09-25 DIAGNOSIS — I10 Essential (primary) hypertension: Secondary | ICD-10-CM | POA: Diagnosis not present

## 2015-09-25 DIAGNOSIS — I2510 Atherosclerotic heart disease of native coronary artery without angina pectoris: Secondary | ICD-10-CM | POA: Insufficient documentation

## 2015-09-25 DIAGNOSIS — Z951 Presence of aortocoronary bypass graft: Secondary | ICD-10-CM | POA: Insufficient documentation

## 2015-09-25 DIAGNOSIS — Z01812 Encounter for preprocedural laboratory examination: Secondary | ICD-10-CM | POA: Diagnosis present

## 2015-09-25 DIAGNOSIS — I4891 Unspecified atrial fibrillation: Secondary | ICD-10-CM | POA: Diagnosis not present

## 2015-09-25 DIAGNOSIS — R229 Localized swelling, mass and lump, unspecified: Secondary | ICD-10-CM | POA: Insufficient documentation

## 2015-09-25 DIAGNOSIS — C801 Malignant (primary) neoplasm, unspecified: Secondary | ICD-10-CM | POA: Insufficient documentation

## 2015-09-25 DIAGNOSIS — C792 Secondary malignant neoplasm of skin: Secondary | ICD-10-CM | POA: Diagnosis not present

## 2015-09-25 DIAGNOSIS — Z7901 Long term (current) use of anticoagulants: Secondary | ICD-10-CM | POA: Insufficient documentation

## 2015-09-25 DIAGNOSIS — R918 Other nonspecific abnormal finding of lung field: Secondary | ICD-10-CM | POA: Diagnosis present

## 2015-09-25 DIAGNOSIS — Z5111 Encounter for antineoplastic chemotherapy: Secondary | ICD-10-CM | POA: Diagnosis not present

## 2015-09-25 LAB — CBC WITH DIFFERENTIAL/PLATELET
BASOS ABS: 0.1 10*3/uL (ref 0–0.1)
BASOS PCT: 1 %
EOS ABS: 0.3 10*3/uL (ref 0–0.7)
Eosinophils Relative: 3 %
HCT: 49.8 % (ref 40.0–52.0)
Hemoglobin: 16.8 g/dL (ref 13.0–18.0)
Lymphocytes Relative: 11 %
Lymphs Abs: 1.2 10*3/uL (ref 1.0–3.6)
MCH: 28 pg (ref 26.0–34.0)
MCHC: 33.8 g/dL (ref 32.0–36.0)
MCV: 82.9 fL (ref 80.0–100.0)
MONO ABS: 0.8 10*3/uL (ref 0.2–1.0)
MONOS PCT: 8 %
Neutro Abs: 8.1 10*3/uL — ABNORMAL HIGH (ref 1.4–6.5)
Neutrophils Relative %: 77 %
PLATELETS: 230 10*3/uL (ref 150–440)
RBC: 6.01 MIL/uL — ABNORMAL HIGH (ref 4.40–5.90)
RDW: 14.3 % (ref 11.5–14.5)
WBC: 10.5 10*3/uL (ref 3.8–10.6)

## 2015-09-25 LAB — COMPREHENSIVE METABOLIC PANEL
ALBUMIN: 3.9 g/dL (ref 3.5–5.0)
ALT: 18 U/L (ref 17–63)
ANION GAP: 10 (ref 5–15)
AST: 18 U/L (ref 15–41)
Alkaline Phosphatase: 91 U/L (ref 38–126)
BILIRUBIN TOTAL: 0.8 mg/dL (ref 0.3–1.2)
BUN: 37 mg/dL — ABNORMAL HIGH (ref 6–20)
CHLORIDE: 95 mmol/L — AB (ref 101–111)
CO2: 25 mmol/L (ref 22–32)
Calcium: 9.7 mg/dL (ref 8.9–10.3)
Creatinine, Ser: 1.53 mg/dL — ABNORMAL HIGH (ref 0.61–1.24)
GFR calc Af Amer: 48 mL/min — ABNORMAL LOW (ref >60)
GFR calc non Af Amer: 42 mL/min — ABNORMAL LOW (ref >60)
GLUCOSE: 115 mg/dL — AB (ref 65–99)
POTASSIUM: 4.4 mmol/L (ref 3.5–5.1)
SODIUM: 130 mmol/L — AB (ref 135–145)
TOTAL PROTEIN: 7.2 g/dL (ref 6.5–8.1)

## 2015-09-25 LAB — APTT: APTT: 32 s (ref 24–36)

## 2015-09-25 LAB — PROTIME-INR
INR: 1.6
Prothrombin Time: 19.1 seconds — ABNORMAL HIGH (ref 11.4–15.0)

## 2015-09-25 MED ORDER — MIDAZOLAM HCL 5 MG/5ML IJ SOLN
INTRAMUSCULAR | Status: AC
  Filled 2015-09-25: qty 5

## 2015-09-25 MED ORDER — FENTANYL CITRATE (PF) 100 MCG/2ML IJ SOLN
INTRAMUSCULAR | Status: AC
  Filled 2015-09-25: qty 2

## 2015-09-25 MED ORDER — SODIUM CHLORIDE 0.9 % IV SOLN: Freq: Once | INTRAVENOUS | Status: DC

## 2015-09-25 NOTE — Progress Notes (Signed)
Patient ID: [name], male   DOB: Apr 13, 1935, 80 y.o.   MRN: [id number]     Cardiology Office Note   Date:  09/25/2015   ID:  [name], DOB [birth year], MRN [id number]  PCP:  Pcp Not In System  Cardiologist:   Sanda Klein, MD   Chief Complaint  Patient presents with  . Follow-up    pt states no chest pain, no edema has some dizziness  . Shortness of Breath    have some SOB      History of Present Illness: [name] is a 80 y.o. male who presents for follow-up for persistent atrial fibrillation, systemic hypertension and coronary artery disease status post CABG in 2007. About a month ago he presented with dysphonia suggestive of possible unilateral vocal cord paralysis. A CT of the chest showed an irregular left upper lung mass worrisome for primary bronchogenic carcinoma as well as ipsilateral lymphadenopathy. A less well-defined right upper lobe pulmonary nodule is also seen. At least 4 small hypodense liver lesions were also seen. The CT of the neck showed evidence of paresis of the left vocal cord and the overall appearance confirmed the clinical impression of left recurrent laryngeal nerve paresis. He subsequently underwent a PET scan that showed that the left upper lobe pulmonary nodule was hypermetabolic also with evidence of metastatic lesions in the right upper lung lobe, the liver, the left adrenal gland, multiple skeletal locations where the overall pressure being that of stage IV lung cancer. He is scheduled to undergo biopsy of a rapidly enlarging subcutaneous abdominal fat pad nodule tomorrow.  He does not have complaints of angina pectoris or exertional dyspnea. He remains in atrial fibrillation and is unaware of the arrhythmia. He has had some problems with weakness and dizziness, his appetite is extremely poor and his oral intake has diminished. His blood pressure is low today.    Past Medical History  Diagnosis Date  . Hypertension   . Coronary  artery disease   . Atrial fibrillation ([place]) 07/2015    Past Surgical History  Procedure Laterality Date  . Coronary artery bypass graft  12/10/2005    LIMA to LAD,SVG to diagonal,SVG to ramus intermediate,SVG to distal RCA.  . Tonsillectomy    . Tee without cardioversion  05/10/2012    Procedure: TRANSESOPHAGEAL ECHOCARDIOGRAM (TEE);  Surgeon: [name], MD;  Location: [facility] ENDOSCOPY;  Service: Cardiovascular;  Laterality: N/A;  . Cardioversion  05/10/2012    Procedure: CARDIOVERSION;  Surgeon: [name], MD;  Location: [facility] ENDOSCOPY;  Service: Cardiovascular;  Laterality: N/A;  . Cardiac catheterization  10/28/2005    significant 3 vessel CAD  . Nm myocar perf wall motion  09/16/2005    evidence of diaphragmatic attenuation & subtle anterolateral ischemia.  . Cardioversion N/A 07/31/2015    Procedure: CARDIOVERSION;  Surgeon: Josue Hector, MD;  Location: [facility] ENDOSCOPY;  Service: Cardiovascular;  Laterality: N/A;     Current Outpatient Prescriptions  Medication Sig Dispense Refill  . aspirin EC 81 MG tablet Take 81 mg by mouth daily.    Marland Kitchen lisinopril (PRINIVIL,ZESTRIL) 10 MG tablet Take 1 tablet (10 mg total) by mouth daily. 90 tablet 3  . metoprolol succinate (TOPROL-XL) 50 MG 24 hr tablet Take 1 tablet (50 mg total) by mouth daily. 90 tablet 3  . ondansetron (ZOFRAN) 4 MG tablet Take 1 tablet (4 mg total) by mouth every 8 (eight) hours as needed for nausea or vomiting. (Patient not taking: Reported on 09/20/2015)  30 tablet 0  . Tamsulosin HCl (FLOMAX) 0.4 MG CAPS Take 0.4 mg by mouth daily after supper.    . warfarin (COUMADIN) 5 MG tablet Take 1 tablet by mouth daily or as directed by coumadin clinic.  Need MD appt for further refills (Patient taking differently: Take 2.5-5 mg by mouth daily after supper. Take 1/2 tablet (2.5 mg) by mouth on Monday, Wednesday, Friday, take 1 tablet (5 mg) on Sunday, Tuesday, Thursday, Saturday or as directed by coumadin clinic) 90 tablet 0    No current facility-administered medications for this visit.   Facility-Administered Medications Ordered in Other Visits  Medication Dose Route Frequency Provider Last Rate Last Dose  . 0.9 %  sodium chloride infusion   Intravenous Once Monia Sabal, PA-C      . fentaNYL (SUBLIMAZE) 100 MCG/2ML injection           . midazolam (VERSED) 5 MG/5ML injection             Allergies:   Clonidine derivatives; Diltiazem hcl er; Statins; and Sulfa antibiotics    Social History:  The patient  reports that he quit smoking about 10 years ago. His smoking use included Cigarettes. He has a 40 pack-year smoking history. He has never used smokeless tobacco. He reports that he does not drink alcohol or use illicit drugs.   Family History:  The patient's family history includes Cancer (age of onset: 12) in his sister; Heart attack (age of onset: 58) in his brother; Heart attack (age of onset: 28) in his father; Stroke (age of onset: 73) in his mother; Stroke (age of onset: 35) in his father.    ROS:  Please see the history of present illness.    Otherwise, review of systems positive for poor appetite, dysgeusia.   All other systems are reviewed and negative.    PHYSICAL EXAM: VS:  BP 102/76 mmHg  Pulse 88  Ht '5\' 6"'$  (1.676 m)  Wt 201 lb 8 oz (91.4 kg)  BMI 32.54 kg/m2 , BMI Body mass index is 32.54 kg/(m^2).  General: Alert, oriented x3, no distress Head: no evidence of trauma, PERRL, EOMI, no exophtalmos or lid lag, no myxedema, no xanthelasma; normal ears, nose and oropharynx Neck: normal jugular venous pulsations and no hepatojugular reflux; brisk carotid pulses without delay and no carotid bruits Chest: clear to auscultation, no signs of consolidation by percussion or palpation, normal fremitus, symmetrical and full respiratory excursions Cardiovascular: normal position and quality of the apical impulse, irregular rhythm, normal first and second heart sounds, no murmurs, rubs or  gallops Abdomen: no tenderness or distention, no masses by palpation, no abnormal pulsatility or arterial bruits, normal bowel sounds, no hepatosplenomegaly Extremities: no clubbing, cyanosis or edema; 2+ radial, ulnar and brachial pulses bilaterally; 2+ right femoral, posterior tibial and dorsalis pedis pulses; 2+ left femoral, posterior tibial and dorsalis pedis pulses; no subclavian or femoral bruits Neurological: grossly nonfocal Psych: euthymic mood, full affect   EKG:  EKG is ordered today. The ekg ordered today demonstrates atrial fibrillation, otherwise normal tracing   Recent Labs: 07/29/2015: B Natriuretic Peptide 158.0*; TSH 1.370 09/25/2015: ALT 18; BUN 37*; Creatinine, Ser 1.53*; Hemoglobin 16.8; Platelets 230; Potassium 4.4; Sodium 130*    Lipid Panel    Component Value Date/Time   TRIG 281* 12/26/2014 1900      Wt Readings from Last 3 Encounters:  09/25/15 202 lb (91.627 kg)  09/24/15 201 lb 8 oz (91.4 kg)  09/20/15 201 lb 1 oz (91.2  kg)      ASSESSMENT AND PLAN:  1. Widespread neoplastic disease, suspect metastatic lung cancer. Due to have a biopsy of a subcutaneous lesion tomorrow. Since the lesion is very superficial, warfarin will not be interrupted. If the biopsy is nondiagnostic, there is a plan for a EBUS guided transbronchial biopsy, for which warfarin will have to be temporarily stopped  2. CAD status post CABG, currently asymptomatic. Preserved left ventricular systolic function, LVEF 49-17 percent by echo in October 2016  3. Long-term persistent atrial fibrillation, probably permanent arrhythmia. Warfarin can be temporarily interrupted 4 surgical procedures but his overall long-term risk of embolic events is high.. CHADSVasc score 4 (age 24, vascular disease, hypertension). Obviously the decision to continue long-term warfarin will depend heavily on his prognosis if he has lung cancer.  4. Relative hypotension. His oral intake has diminished. I would like  to discontinue his diuretic altogether. We'll switch to lisinopril 10 mg daily instead of Zestoretic.    Current medicines are reviewed at length with the patient today.  The patient does not have concerns regarding medicines.  The following changes have been made:  Stop lisinopril/hydrochlorothiazide. Take lisinopril 10 mg once daily  Labs/ tests ordered today include:  Orders Placed This Encounter  Procedures  . EKG 12-Lead      Patient Instructions  Your physician has recommended you make the following change in your medication:   STOP ZESTORETIC (LISINOPRIL HCTZ)  START LISINOPRIL 10 MG DAILY.  THIS RX HAS BEEN SENT TO YOUR PHARMACY  Dr. Sallyanne Kuster recommends that you schedule a follow-up appointment in: ONE YEAR        SignedSanda Klein, MD  09/25/2015 3:10 PM    Sanda Klein, MD, [facility] [phone] office [phone] pager

## 2015-09-25 NOTE — OR Nursing (Signed)
Dr watts notified that PT/INR pending, and labs drawn in cancer center earlier PT/INR still pending.

## 2015-09-25 NOTE — Procedures (Signed)
Technically successful US guided biopsy of a hypermetabolic subcutaneous nodule within the left mid ventral abdominal wall.   No immediate complications.   Ronny Bacon, MD Pager #: [phone]

## 2015-09-25 NOTE — Consult Note (Signed)
Chief Complaint: Patient was seen in consultation today for ultrasound-guided abdominal wall nodule biopsy at the request of Choksi,Janak  Referring Physician(s): Choksi,Janak  History of Present Illness: [name] is a 80 y.o. male with past medical history significant for CAD (post CABG) and atrial fibrillation, on chronic Coumadin therapy) who was found to have a indeterminate nodule/mass within the left upper lobe which was worrisome for lung cancer on chest CT performed 09/14/2015.   Subsequent PET scan performed 09/19/2015 demonstrated this nodule/mass to be hypermetabolic with hepatic, left adrenal gland, multiple osseous and skeletal muscle metastasis. Additionally, the patient was noted to have a punctate (approximately 1.2 cm) hypermetabolic nodule within the soft tissues about the ventral left mid abdominal wall.   Patient presents today for ultrasound guided biopsy of this hypermetabolic ventral abdominal wall subcutaneous nodule.  The patient is otherwise without complaint. No fever or chills. No cough or shortness of breath. The patient is accompanied by his wife and family friend though serves as his own historian. The patient does not wish to receive intervenous sedation for this biopsy.  Past Medical History  Diagnosis Date  . Hypertension   . Coronary artery disease   . Atrial fibrillation ([place]) 07/2015    Past Surgical History  Procedure Laterality Date  . Coronary artery bypass graft  12/10/2005    LIMA to LAD,SVG to diagonal,SVG to ramus intermediate,SVG to distal RCA.  . Tonsillectomy    . Tee without cardioversion  05/10/2012    Procedure: TRANSESOPHAGEAL ECHOCARDIOGRAM (TEE);  Surgeon: [name], MD;  Location: [facility] ENDOSCOPY;  Service: Cardiovascular;  Laterality: N/A;  . Cardioversion  05/10/2012    Procedure: CARDIOVERSION;  Surgeon: [name], MD;  Location: [facility] ENDOSCOPY;  Service: Cardiovascular;  Laterality: N/A;  . Cardiac  catheterization  10/28/2005    significant 3 vessel CAD  . Nm myocar perf wall motion  09/16/2005    evidence of diaphragmatic attenuation & subtle anterolateral ischemia.  . Cardioversion N/A 07/31/2015    Procedure: CARDIOVERSION;  Surgeon: Josue Hector, MD;  Location: [facility] ENDOSCOPY;  Service: Cardiovascular;  Laterality: N/A;    Allergies: Clonidine derivatives; Diltiazem hcl er; Statins; and Sulfa antibiotics  Medications: Prior to Admission medications   Medication Sig Start Date End Date Taking? Authorizing Provider  aspirin EC 81 MG tablet Take 81 mg by mouth daily.   Yes Historical Provider, MD  lisinopril (PRINIVIL,ZESTRIL) 10 MG tablet Take 1 tablet (10 mg total) by mouth daily. 09/24/15  Yes [name], MD  metoprolol succinate (TOPROL-XL) 50 MG 24 hr tablet Take 1 tablet (50 mg total) by mouth daily. 08/27/15  Yes [name], MD  Tamsulosin HCl (FLOMAX) 0.4 MG CAPS Take 0.4 mg by mouth daily after supper.   Yes Historical Provider, MD  warfarin (COUMADIN) 5 MG tablet Take 1 tablet by mouth daily or as directed by coumadin clinic.  Need MD appt for further refills Patient taking differently: Take 2.5-5 mg by mouth daily after supper. Take 1/2 tablet (2.5 mg) by mouth on Monday, Wednesday, Friday, take 1 tablet (5 mg) on Sunday, Tuesday, Thursday, Saturday or as directed by coumadin clinic 06/11/15  Yes [name], MD  ondansetron (ZOFRAN) 4 MG tablet Take 1 tablet (4 mg total) by mouth every 8 (eight) hours as needed for nausea or vomiting. Patient not taking: Reported on 09/20/2015 08/27/15   Sanda Klein, MD     Family History  Problem Relation Age of Onset  . Stroke  Mother 27  . Stroke Father 60  . Heart attack Father 53  . Heart attack Brother 11  . Cancer Sister 105    Social History   Social History  . Marital Status: Married    Spouse Name: N/A  . Number of Children: N/A  . Years of Education: N/A   Social History Main Topics  . Smoking status:  Former Smoker -- 1.00 packs/day for 40 years    Types: Cigarettes    Quit date: 09/19/2005  . Smokeless tobacco: Never Used  . Alcohol Use: No  . Drug Use: No  . Sexual Activity: Not Asked   Other Topics Concern  . None   Social History Narrative    ECOG Status: 0 - Asymptomatic  Review of Systems: A 12 point ROS discussed and pertinent positives are indicated in the HPI above.  All other systems are negative.  Review of Systems  Constitutional: Negative.   Respiratory: Negative.   Cardiovascular: Negative.     Vital Signs: BP 135/79 mmHg  Pulse 89  Temp(Src) 98.2 F (36.8 C)  Resp 22  Ht '5\' 6"'$  (1.676 m)  Wt 202 lb (91.627 kg)  BMI 32.62 kg/m2  SpO2 97%  Physical Exam  Constitutional: He appears well-developed and well-nourished.  Cardiovascular:  Irregularly, irregular compatible with history of afib.  Pulmonary/Chest: Effort normal and breath sounds normal.  Abdominal: Soft. He exhibits mass.  Palpable nodule within the ventral left mid abdominal wall c/w hypermetabolic nodule seen on preceding PET/CT.  Psychiatric: He has a normal mood and affect. His behavior is normal.  Nursing note reviewed.   Mallampati Score:     Imaging: Ct Soft Tissue Neck W Contrast  09/14/2015  CLINICAL DATA:  Vocal cord paralysis after cardioversion 1 month ago. Right-sided chest pain. EXAM: CT NECK WITH CONTRAST TECHNIQUE: Multidetector CT imaging of the neck was performed using the standard protocol following the bolus administration of intravenous contrast. CONTRAST:  18m OMNIPAQUE IOHEXOL 300 MG/ML  SOLN COMPARISON:  None. FINDINGS: Pharynx and larynx: No evidence of mucosal or submucosal lesion. There are findings consistent with vocal cord paresis on the left with a "Sail sign" . No definable laryngeal mass, though a small mass or polyp of the left vocal cord could simulate this appearance. Salivary glands: Normal appearance of the parotid and submandibular glands. Thyroid:  Normal Lymph nodes: No enlarged or low-density nodes on either side of the neck. Vascular: Jugular veins are patent with the left being dominant. There is atherosclerosis of the aorta and of the carotid bifurcation regions. This study was not done in the form of a a CT angiogram, but stenosis in both ICA regions approaches 50% with soft and calcified plaque. Limited intracranial: Normal Visualized orbits: Limited visualization common normal Mastoids and visualized paranasal sinuses: Clear Skeleton: Normal Upper chest: See results of chest CT. Worrisome for 3 cm mass in the left upper lobe with abnormal soft tissue in the left superior mediastinum that could represent metastatic disease, possibly affecting the recurrent laryngeal nerve. 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 Left upper lobe lung mass with changes worrisome for metastatic disease to the mediastinum which could affect the recurrent laryngeal nerve on the left. See results of chest CT. Electronically Signed   By: MNelson ChimesM.D.   On: 09/14/2015 09:53   Ct Chest W Contrast  09/14/2015  CLINICAL DATA:  Right chest pain. Recent defibrillation for arrhythmia. EXAM: CT CHEST WITH CONTRAST TECHNIQUE: Multidetector CT imaging of  the chest was performed during intravenous contrast administration. CONTRAST:  26m OMNIPAQUE IOHEXOL 300 MG/ML  SOLN COMPARISON:  07/29/2015 chest radiograph FINDINGS: Mediastinum/Nodes: Mild cardiomegaly. No pericardial fluid/thickening. Left main, left anterior descending, left circumflex and right coronary atherosclerosis. Great vessels are normal in course and caliber. No central pulmonary emboli. Normal visualized thyroid. Normal esophagus. No axillary adenopathy. There is a mildly enlarged 1.4 cm subcarinal node (series 3/ image 29). There is a mildly enlarged 1.1 cm right paratracheal node (3/23). There is confluent left hilar and ipsilateral left upper lobe peribronchial adenopathy, for example a mildly  enlarged 1.4 cm left hilar node (3/25). There is irregular infiltrative aortopulmonary window and left prevascular mediastinal adenopathy, which is confluent with the left hilar adenopathy, for example a 2.5 cm enlarged AP window node (3/22). No right hilar adenopathy. Lungs/Pleura: No pneumothorax. There is a trace left pleural effusion. No right pleural effusion. There is an irregular 3.2 x 2.5 cm posterior medial left upper lobe lung mass (series 4/ image 17) abutting and distorting the superior aspect of the left major fissure and abutting the mediastinal pleural surface, with occlusion of the associated segmental left upper lobe airway. There is a 0.7 cm anterior medial right upper lobe solid pulmonary nodule (series 4/ image 23). There is a subsolid 2.3 x 1.5 cm right upper lobe pulmonary nodule (4/28) with a 5 mm solid component. There is a 4 mm solid left upper lobe pulmonary nodule (4/18). There is an 8 mm solid pulmonary nodule in the lingula (4/39). There is mild patchy ground-glass opacity throughout the left upper lobe. There is a 3 mm left lower lobe solid pulmonary nodule (4/45). Upper abdomen: Simple 2.3 cm left liver lobe cyst. There are at least 4 subcentimeter hypodense lesions scattered throughout the visualized liver, too small to characterize. Musculoskeletal: No aggressive appearing focal osseous lesions. Mild degenerative changes in the thoracic spine. There is a mildly displaced subacute lateral right seventh rib fracture with slight periosteal callus formation suggesting early healing response. No definite bone lesion is seen at the fracture site. IMPRESSION: 1. Irregular 3.2 x 2.5 cm posteromedial left upper lobe lung mass abutting the left major fissure and mediastinal pleural surface, with occlusion of the associated segmental left upper lobe airway, in keeping with a primary bronchogenic carcinoma. Scattered ground-glass opacity in the left upper lobe, in keeping with mild  postobstructive atelectasis/pneumonitis. 2. Ipsilateral peribronchial and ipsilateral hilar lymphadenopathy. Aortopulmonary window, subcarinal and contralateral paratracheal mediastinal lymphadenopathy. 3. Right upper lobe subsolid 2.3 x 1.5 cm pulmonary nodule, indeterminate, cannot exclude a synchronous primary bronchogenic carcinoma versus contralateral metastasis. 4. Four additional subcentimeter solid pulmonary nodules in both lungs, cannot exclude pulmonary metastases. 5. Trace left pleural effusion. 6. At least 4 scattered subcentimeter hypodense liver lesions, too small to characterize, liver metastases not excluded. 7. Subacute mildly displaced lateral right seventh rib fracture. No definite pathologic bone lesions detected. 8. Recommend thoracic surgical and oncology consultation and further staging with PET-CT. These results will be called to the ordering clinician or representative by the Radiologist Assistant, and communication documented in the PACS or zVision Dashboard. Electronically Signed   By: JIlona SorrelM.D.   On: 09/14/2015 10:54   Nm Pet Image Initial (pi) Skull Base To Thigh  09/19/2015  CLINICAL DATA:  Initial treatment strategy for lung mass. EXAM: NUCLEAR MEDICINE PET SKULL BASE TO THIGH TECHNIQUE: 12.7 mCi F-18 FDG was injected intravenously. Full-ring PET imaging was performed from the skull base to thigh after the radiotracer.  CT data was obtained and used for attenuation correction and anatomic localization. FASTING BLOOD GLUCOSE:  Value: 118 mg/dl COMPARISON:  Chest CT 09/14/2015. CT the abdomen and pelvis 12/27/2014. Neck CT 09/14/2015. FINDINGS: NECK No hypermetabolic lymph nodes in the neck. CHEST 2.8 x 2.8 cm left upper lobe nodule is hypermetabolic (SUVmax = 9.9), and makes contact with the overlying pleura. There is extensive hypermetabolic left hilar lymphadenopathy (SUVmax = 16 point to), which is contiguous with multiple adjacent nonenlarged and mildly enlarged but very  hypermetabolic mediastinal lymph nodes throughout the prevascular, low left paratracheal and subcarinal nodal stations. Subcarinal lymph node measures 13 mm in short axis (SUVmax = 10.1). There is also nonenlarged but hypermetabolic right hilar lymphadenopathy (SUVmax = 8.3). Nonenlarged but hypermetabolic superior mediastinal and left supraclavicular lymph nodes are also noted (SUVmax = 6.0). 8 mm nodule in the anterior aspect of the right upper lobe (image 92 of series 4) is also a hypermetabolic (SUVmax = 4.4), presumably metastatic. Heart size is mildly enlarged with right atrial dilatation. There is no significant pericardial fluid, thickening or pericardial calcification. There is atherosclerosis of the thoracic aorta, the great vessels of the mediastinum and the coronary arteries, including calcified atherosclerotic plaque in the left main, left anterior descending, left circumflex and right coronary arteries. Status post median sternotomy for CABG, including LIMA to the LAD. Calcifications of the aortic valve. ABDOMEN/PELVIS Small focus of hypermetabolism (SUVmax = 6.6)in segment 7 of the liver without corresponding CT abnormality. 1.4 cm left adrenal nodule is hypermetabolic (SUVmax = 4.4). No abnormal hypermetabolic activity within the pancreas, right adrenal gland, or spleen. No hypermetabolic lymph nodes in the abdomen or pelvis. SKELETON Multiple areas of hypermetabolism in the skeletal musculature, compatible with soft tissue metastasis, including a lesion in the right subscapularis muscle (SUVmax = 7.6), and a lesion in the left anterior abdominal wall musculature (SUVmax = 7.2). In addition, there are innumerable areas of hypermetabolism throughout the visualized axial and appendicular skeleton. The majority of these lesions are occult by CT imaging. Some of these lesions include an expansile lesion with minimally displaced pathologic fracture (SUVmax = 8.7) in the lateral aspect of the right seventh  rib (image 111 of series 4), a large diffusely infiltrative hypermetabolic (SUVmax = 88.4)ZYSAYT in L1, and a large lesion in the left ilium (SUVmax = 17.9). Multiple other lesions are noted, including bilateral femoral neck lesions. IMPRESSION: 1. 2.8 cm hypermetabolic left upper lobe pulmonary nodule with bilateral hilar and mediastinal lymphadenopathy, metastatic lesions in right upper lobe of the lung, the segment 7 of the liver and in the left adrenal gland, multiple skeletal muscle metastasis, as well as metastatic lesions throughout all aspects of the visualized axial and appendicular skeleton, as discussed above, most likely to represent stage IV lung cancer. 2. Additional incidental findings, as above. Electronically Signed   By: Vinnie Langton M.D.   On: 09/19/2015 11:53    Labs:  CBC:  Recent Labs  12/28/14 0541 07/29/15 0051 07/29/15 0940 09/25/15 1119  WBC 13.6* 12.8* 12.9* 10.5  HGB 14.8 17.6* 16.3 16.8  HCT 45.2 49.8 48.5 49.8  PLT 124* 153 140* 230    COAGS:  Recent Labs  08/29/15 1125 09/10/15 1152 09/19/15 1554 09/25/15 1119  INR 2.6 4.2 3.6 1.60  APTT  --   --   --  32    BMP:  Recent Labs  12/28/14 0541 12/29/14 0537 07/29/15 0051 09/14/15 0845 09/25/15 1119  NA 137 138 138  --  130*  K 3.5 3.8 4.1  --  4.4  CL 107 107 107  --  95*  CO2 '23 23 22  '$ --  25  GLUCOSE 102* 131* 132*  --  115*  BUN 21 20 21*  --  37*  CALCIUM 7.8* 7.9* 8.7*  --  9.7  CREATININE 1.33 1.22 1.44* 1.50* 1.53*  GFRNONAA 50* 55* 45*  --  42*  GFRAA 57* 64* 52*  --  48*    LIVER FUNCTION TESTS:  Recent Labs  12/27/14 0550 12/29/14 0537 07/29/15 0051 09/25/15 1119  BILITOT 1.3* 1.5* 1.1 0.8  AST '17 20 21 18  '$ ALT 13 15 15* 18  ALKPHOS 56 50 66 91  PROT 6.6 6.1 7.5 7.2  ALBUMIN 3.8 3.3* 4.1 3.9    TUMOR MARKERS: No results for input(s): AFPTM, CEA, CA199, CHROMGRNA in the last 8760 hours.  Assessment and Plan:  [name] is a 80 y.o. male with past  medical history significant for CAD (post CABG) and atrial fibrillation, on chronic Coumadin therapy) who was found to have a indeterminate nodule/mass within the left upper lobe which was worrisome for lung cancer on chest CT performed 09/14/2015 with subsequent PET/CT performed 09/19/2015 demonstrating apparent widespread metastasis.  Patient presents today for ultrasound-guided biopsy of a punctate (approximately 1.2 cm) hypermetabolic nodule within the soft tissues about the ventral left mid abdominal wall for tissue diagnostic purposes.  Risks and Benefits of ultrasound-guided nodule biopsy were discussed with the patient including, but not limited to bleeding, infection, damage to adjacent structures or low yield requiring additional tests.  All of the patient's questions were answered, patient is agreeable to proceed.  Consent signed and in chart.  A copy of this report was sent to the requesting provider on this date.  SignedSandi Mariscal 09/25/2015, 1:57 PM   I spent a total of 15 Minutes in face to face in clinical consultation, greater than 50% of which was counseling/coordinating care for ultrasound-guided abdominal wall nodule biopsy

## 2015-09-28 ENCOUNTER — Telehealth: Payer: Self-pay | Admitting: *Deleted

## 2015-09-28 NOTE — Telephone Encounter (Signed)
Called a prelimiinary report of Abd wall mass bx. Dx: Metastatic carcinoma. Is doing additional staining to see if primary site can be identified

## 2015-10-01 ENCOUNTER — Ambulatory Visit: Payer: Medicare Other | Admitting: Oncology

## 2015-10-02 ENCOUNTER — Inpatient Hospital Stay: Payer: Medicare Other

## 2015-10-02 ENCOUNTER — Inpatient Hospital Stay (HOSPITAL_BASED_OUTPATIENT_CLINIC_OR_DEPARTMENT_OTHER): Payer: Medicare Other | Admitting: Oncology

## 2015-10-02 ENCOUNTER — Other Ambulatory Visit: Payer: Self-pay | Admitting: Oncology

## 2015-10-02 VITALS — BP 100/66 | HR 109 | Temp 97.0°F | Wt 196.9 lb

## 2015-10-02 DIAGNOSIS — R0602 Shortness of breath: Secondary | ICD-10-CM

## 2015-10-02 DIAGNOSIS — C3490 Malignant neoplasm of unspecified part of unspecified bronchus or lung: Secondary | ICD-10-CM

## 2015-10-02 DIAGNOSIS — C3412 Malignant neoplasm of upper lobe, left bronchus or lung: Secondary | ICD-10-CM

## 2015-10-02 DIAGNOSIS — C7951 Secondary malignant neoplasm of bone: Secondary | ICD-10-CM | POA: Diagnosis not present

## 2015-10-02 DIAGNOSIS — J90 Pleural effusion, not elsewhere classified: Secondary | ICD-10-CM

## 2015-10-02 DIAGNOSIS — R5383 Other fatigue: Secondary | ICD-10-CM

## 2015-10-02 DIAGNOSIS — R918 Other nonspecific abnormal finding of lung field: Secondary | ICD-10-CM

## 2015-10-02 DIAGNOSIS — J3801 Paralysis of vocal cords and larynx, unilateral: Secondary | ICD-10-CM

## 2015-10-02 DIAGNOSIS — R05 Cough: Secondary | ICD-10-CM

## 2015-10-02 DIAGNOSIS — C7989 Secondary malignant neoplasm of other specified sites: Secondary | ICD-10-CM | POA: Diagnosis not present

## 2015-10-02 DIAGNOSIS — Z87891 Personal history of nicotine dependence: Secondary | ICD-10-CM

## 2015-10-02 DIAGNOSIS — R112 Nausea with vomiting, unspecified: Secondary | ICD-10-CM

## 2015-10-02 DIAGNOSIS — Z5111 Encounter for antineoplastic chemotherapy: Secondary | ICD-10-CM | POA: Diagnosis not present

## 2015-10-02 DIAGNOSIS — R490 Dysphonia: Secondary | ICD-10-CM

## 2015-10-02 MED ORDER — SODIUM CHLORIDE 0.9 % IV SOLN
Freq: Once | INTRAVENOUS | Status: AC
  Administered 2015-10-02: 15:00:00 via INTRAVENOUS
  Filled 2015-10-02: qty 1000

## 2015-10-02 MED ORDER — PREDNISONE 20 MG PO TABS: 20.0000 mg | ORAL_TABLET | Freq: Every day | ORAL | Status: DC

## 2015-10-02 MED ORDER — SODIUM CHLORIDE 0.9 % IV SOLN
Freq: Once | INTRAVENOUS | Status: AC
  Administered 2015-10-02: 15:00:00 via INTRAVENOUS
  Filled 2015-10-02: qty 4

## 2015-10-02 MED ORDER — HYDROCODONE-ACETAMINOPHEN 5-325 MG PO TABS: 1.0000 | ORAL_TABLET | Freq: Four times a day (QID) | ORAL | Status: DC | PRN

## 2015-10-02 NOTE — Progress Notes (Signed)
Patient her for bx results.  States he cannot keep anything down.  Not eating at all.  States he hurts in his rib cage and it is hard for him to lie down at night.  Requesting pain medication.

## 2015-10-03 ENCOUNTER — Ambulatory Visit (INDEPENDENT_AMBULATORY_CARE_PROVIDER_SITE_OTHER): Payer: Medicare Other | Admitting: *Deleted

## 2015-10-03 DIAGNOSIS — I4891 Unspecified atrial fibrillation: Secondary | ICD-10-CM

## 2015-10-03 DIAGNOSIS — Z5181 Encounter for therapeutic drug level monitoring: Secondary | ICD-10-CM | POA: Diagnosis not present

## 2015-10-03 LAB — POCT INR: INR: 2.7

## 2015-10-03 NOTE — Patient Instructions (Signed)
Paclitaxel injection What is this medicine? PACLITAXEL (PAK li TAX el) is a chemotherapy drug. It targets fast dividing cells, like cancer cells, and causes these cells to die. This medicine is used to treat ovarian cancer, breast cancer, and other cancers. This medicine may be used for other purposes; ask your health care provider or pharmacist if you have questions. What should I tell my health care provider before I take this medicine? They need to know if you have any of these conditions: -blood disorders -irregular heartbeat -infection (especially a virus infection such as chickenpox, cold sores, or herpes) -liver disease -previous or ongoing radiation therapy -an unusual or allergic reaction to paclitaxel, alcohol, polyoxyethylated castor oil, other chemotherapy agents, other medicines, foods, dyes, or preservatives -pregnant or trying to get pregnant -breast-feeding How should I use this medicine? This drug is given as an infusion into a vein. It is administered in a hospital or clinic by a specially trained health care professional. Talk to your pediatrician regarding the use of this medicine in children. Special care may be needed. Overdosage: If you think you have taken too much of this medicine contact a poison control center or emergency room at once. NOTE: This medicine is only for you. Do not share this medicine with others. What if I miss a dose? It is important not to miss your dose. Call your doctor or health care professional if you are unable to keep an appointment. What may interact with this medicine? Do not take this medicine with any of the following medications: -disulfiram -metronidazole This medicine may also interact with the following medications: -cyclosporine -diazepam -ketoconazole -medicines to increase blood counts like filgrastim, pegfilgrastim, sargramostim -other chemotherapy drugs like cisplatin, doxorubicin, epirubicin, etoposide, teniposide,  vincristine -quinidine -testosterone -vaccines -verapamil Talk to your doctor or health care professional before taking any of these medicines: -acetaminophen -aspirin -ibuprofen -ketoprofen -naproxen This list may not describe all possible interactions. Give your health care provider a list of all the medicines, herbs, non-prescription drugs, or dietary supplements you use. Also tell them if you smoke, drink alcohol, or use illegal drugs. Some items may interact with your medicine. What should I watch for while using this medicine? Your condition will be monitored carefully while you are receiving this medicine. You will need important blood work done while you are taking this medicine. This drug may make you feel generally unwell. This is not uncommon, as chemotherapy can affect healthy cells as well as cancer cells. Report any side effects. Continue your course of treatment even though you feel ill unless your doctor tells you to stop. This medicine can cause serious allergic reactions. To reduce your risk you will need to take other medicine(s) before treatment with this medicine. In some cases, you may be given additional medicines to help with side effects. Follow all directions for their use. Call your doctor or health care professional for advice if you get a fever, chills or sore throat, or other symptoms of a cold or flu. Do not treat yourself. This drug decreases your body's ability to fight infections. Try to avoid being around people who are sick. This medicine may increase your risk to bruise or bleed. Call your doctor or health care professional if you notice any unusual bleeding. Be careful brushing and flossing your teeth or using a toothpick because you may get an infection or bleed more easily. If you have any dental work done, tell your dentist you are receiving this medicine. Avoid taking products that   contain aspirin, acetaminophen, ibuprofen, naproxen, or ketoprofen unless  instructed by your doctor. These medicines may hide a fever. Do not become pregnant while taking this medicine. Women should inform their doctor if they wish to become pregnant or think they might be pregnant. There is a potential for serious side effects to an unborn child. Talk to your health care professional or pharmacist for more information. Do not breast-feed an infant while taking this medicine. Men are advised not to father a child while receiving this medicine. This product may contain alcohol. Ask your pharmacist or healthcare provider if this medicine contains alcohol. Be sure to tell all healthcare providers you are taking this medicine. Certain medicines, like metronidazole and disulfiram, can cause an unpleasant reaction when taken with alcohol. The reaction includes flushing, headache, nausea, vomiting, sweating, and increased thirst. The reaction can last from 30 minutes to several hours. What side effects may I notice from receiving this medicine? Side effects that you should report to your doctor or health care professional as soon as possible: -allergic reactions like skin rash, itching or hives, swelling of the face, lips, or tongue -low blood counts - This drug may decrease the number of white blood cells, red blood cells and platelets. You may be at increased risk for infections and bleeding. -signs of infection - fever or chills, cough, sore throat, pain or difficulty passing urine -signs of decreased platelets or bleeding - bruising, pinpoint red spots on the skin, black, tarry stools, nosebleeds -signs of decreased red blood cells - unusually weak or tired, fainting spells, lightheadedness -breathing problems -chest pain -high or low blood pressure -mouth sores -nausea and vomiting -pain, swelling, redness or irritation at the injection site -pain, tingling, numbness in the hands or feet -slow or irregular heartbeat -swelling of the ankle, feet, hands Side effects that  usually do not require medical attention (report to your doctor or health care professional if they continue or are bothersome): -bone pain -complete hair loss including hair on your head, underarms, pubic hair, eyebrows, and eyelashes -changes in the color of fingernails -diarrhea -loosening of the fingernails -loss of appetite -muscle or joint pain -red flush to skin -sweating This list may not describe all possible side effects. Call your doctor for medical advice about side effects. You may report side effects to FDA at 1-800-FDA-1088. Where should I keep my medicine? This drug is given in a hospital or clinic and will not be stored at home. NOTE: This sheet is a summary. It may not cover all possible information. If you have questions about this medicine, talk to your doctor, pharmacist, or health care provider.    2016, [employer]. (2015-05-24 13:02:56) Carboplatin injection What is this medicine? CARBOPLATIN (KAR boe pla tin) is a chemotherapy drug. It targets fast dividing cells, like cancer cells, and causes these cells to die. This medicine is used to treat ovarian cancer and many other cancers. This medicine may be used for other purposes; ask your health care provider or pharmacist if you have questions. What should I tell my health care provider before I take this medicine? They need to know if you have any of these conditions: -blood disorders -hearing problems -kidney disease -recent or ongoing radiation therapy -an unusual or allergic reaction to carboplatin, cisplatin, other chemotherapy, other medicines, foods, dyes, or preservatives -pregnant or trying to get pregnant -breast-feeding How should I use this medicine? This drug is usually given as an infusion into a vein. It is administered in a  hospital or clinic by a specially trained health care professional. Talk to your pediatrician regarding the use of this medicine in children. Special care may be  needed. Overdosage: If you think you have taken too much of this medicine contact a poison control center or emergency room at once. NOTE: This medicine is only for you. Do not share this medicine with others. What if I miss a dose? It is important not to miss a dose. Call your doctor or health care professional if you are unable to keep an appointment. What may interact with this medicine? -medicines for seizures -medicines to increase blood counts like filgrastim, pegfilgrastim, sargramostim -some antibiotics like amikacin, gentamicin, neomycin, streptomycin, tobramycin -vaccines Talk to your doctor or health care professional before taking any of these medicines: -acetaminophen -aspirin -ibuprofen -ketoprofen -naproxen This list may not describe all possible interactions. Give your health care provider a list of all the medicines, herbs, non-prescription drugs, or dietary supplements you use. Also tell them if you smoke, drink alcohol, or use illegal drugs. Some items may interact with your medicine. What should I watch for while using this medicine? Your condition will be monitored carefully while you are receiving this medicine. You will need important blood work done while you are taking this medicine. This drug may make you feel generally unwell. This is not uncommon, as chemotherapy can affect healthy cells as well as cancer cells. Report any side effects. Continue your course of treatment even though you feel ill unless your doctor tells you to stop. In some cases, you may be given additional medicines to help with side effects. Follow all directions for their use. Call your doctor or health care professional for advice if you get a fever, chills or sore throat, or other symptoms of a cold or flu. Do not treat yourself. This drug decreases your body's ability to fight infections. Try to avoid being around people who are sick. This medicine may increase your risk to bruise or bleed.  Call your doctor or health care professional if you notice any unusual bleeding. Be careful brushing and flossing your teeth or using a toothpick because you may get an infection or bleed more easily. If you have any dental work done, tell your dentist you are receiving this medicine. Avoid taking products that contain aspirin, acetaminophen, ibuprofen, naproxen, or ketoprofen unless instructed by your doctor. These medicines may hide a fever. Do not become pregnant while taking this medicine. Women should inform their doctor if they wish to become pregnant or think they might be pregnant. There is a potential for serious side effects to an unborn child. Talk to your health care professional or pharmacist for more information. Do not breast-feed an infant while taking this medicine. What side effects may I notice from receiving this medicine? Side effects that you should report to your doctor or health care professional as soon as possible: -allergic reactions like skin rash, itching or hives, swelling of the face, lips, or tongue -signs of infection - fever or chills, cough, sore throat, pain or difficulty passing urine -signs of decreased platelets or bleeding - bruising, pinpoint red spots on the skin, black, tarry stools, nosebleeds -signs of decreased red blood cells - unusually weak or tired, fainting spells, lightheadedness -breathing problems -changes in hearing -changes in vision -chest pain -high blood pressure -low blood counts - This drug may decrease the number of white blood cells, red blood cells and platelets. You may be at increased risk for infections and  bleeding. -nausea and vomiting -pain, swelling, redness or irritation at the injection site -pain, tingling, numbness in the hands or feet -problems with balance, talking, walking -trouble passing urine or change in the amount of urine Side effects that usually do not require medical attention (report to your doctor or health  care professional if they continue or are bothersome): -hair loss -loss of appetite -metallic taste in the mouth or changes in taste This list may not describe all possible side effects. Call your doctor for medical advice about side effects. You may report side effects to FDA at 1-800-FDA-1088. Where should I keep my medicine? This drug is given in a hospital or clinic and will not be stored at home. NOTE: This sheet is a summary. It may not cover all possible information. If you have questions about this medicine, talk to your doctor, pharmacist, or health care provider.    2016, [employer]. (2008-01-11 14:38:05) Denosumab injection What is this medicine? DENOSUMAB (den oh sue mab) slows bone breakdown. Prolia is used to treat osteoporosis in women after menopause and in men. Delton See is used to prevent bone fractures and other bone problems caused by cancer bone metastases. Delton See is also used to treat giant cell tumor of the bone. This medicine may be used for other purposes; ask your health care provider or pharmacist if you have questions. What should I tell my health care provider before I take this medicine? They need to know if you have any of these conditions: -dental disease -eczema -infection or history of infections -kidney disease or on dialysis -low blood calcium or vitamin D -malabsorption syndrome -scheduled to have surgery or tooth extraction -taking medicine that contains denosumab -thyroid or parathyroid disease -an unusual reaction to denosumab, other medicines, foods, dyes, or preservatives -pregnant or trying to get pregnant -breast-feeding How should I use this medicine? This medicine is for injection under the skin. It is given by a health care professional in a hospital or clinic setting. If you are getting Prolia, a special MedGuide will be given to you by the pharmacist with each prescription and refill. Be sure to read this information carefully  each time. For Prolia, talk to your pediatrician regarding the use of this medicine in children. Special care may be needed. For Delton See, talk to your pediatrician regarding the use of this medicine in children. While this drug may be prescribed for children as young as 13 years for selected conditions, precautions do apply. Overdosage: If you think you have taken too much of this medicine contact a poison control center or emergency room at once. NOTE: This medicine is only for you. Do not share this medicine with others. What if I miss a dose? It is important not to miss your dose. Call your doctor or health care professional if you are unable to keep an appointment. What may interact with this medicine? Do not take this medicine with any of the following medications: -other medicines containing denosumab This medicine may also interact with the following medications: -medicines that suppress the immune system -medicines that treat cancer -steroid medicines like prednisone or cortisone This list may not describe all possible interactions. Give your health care provider a list of all the medicines, herbs, non-prescription drugs, or dietary supplements you use. Also tell them if you smoke, drink alcohol, or use illegal drugs. Some items may interact with your medicine. What should I watch for while using this medicine? Visit your doctor or health care professional for regular checks  on your progress. Your doctor or health care professional may order blood tests and other tests to see how you are doing. Call your doctor or health care professional if you get a cold or other infection while receiving this medicine. Do not treat yourself. This medicine may decrease your body's ability to fight infection. You should make sure you get enough calcium and vitamin D while you are taking this medicine, unless your doctor tells you not to. Discuss the foods you eat and the vitamins you take with your health  care professional. See your dentist regularly. Brush and floss your teeth as directed. Before you have any dental work done, tell your dentist you are receiving this medicine. Do not become pregnant while taking this medicine or for 5 months after stopping it. Women should inform their doctor if they wish to become pregnant or think they might be pregnant. There is a potential for serious side effects to an unborn child. Talk to your health care professional or pharmacist for more information. What side effects may I notice from receiving this medicine? Side effects that you should report to your doctor or health care professional as soon as possible: -allergic reactions like skin rash, itching or hives, swelling of the face, lips, or tongue -breathing problems -chest pain -fast, irregular heartbeat -feeling faint or lightheaded, falls -fever, chills, or any other sign of infection -muscle spasms, tightening, or twitches -numbness or tingling -skin blisters or bumps, or is dry, peels, or red -slow healing or unexplained pain in the mouth or jaw -unusual bleeding or bruising Side effects that usually do not require medical attention (Report these to your doctor or health care professional if they continue or are bothersome.): -muscle pain -stomach upset, gas This list may not describe all possible side effects. Call your doctor for medical advice about side effects. You may report side effects to FDA at 1-800-FDA-1088. Where should I keep my medicine? This medicine is only given in a clinic, doctor's office, or other health care setting and will not be stored at home. NOTE: This sheet is a summary. It may not cover all possible information. If you have questions about this medicine, talk to your doctor, pharmacist, or health care provider.    2016, [employer]. (2012-04-05 12:37:47)

## 2015-10-04 ENCOUNTER — Inpatient Hospital Stay: Payer: Medicare Other

## 2015-10-05 ENCOUNTER — Telehealth: Payer: Self-pay | Admitting: *Deleted

## 2015-10-05 ENCOUNTER — Ambulatory Visit: Payer: Medicare Other

## 2015-10-05 NOTE — Telephone Encounter (Signed)
May increase Zofran to 4 mg to every 6 hours. Daughter informed

## 2015-10-05 NOTE — Telephone Encounter (Signed)
Having nausea and some vomiting, Not eating, Taking Prednisone and Hydrocodone on an empty stomach. Only taking in  Only a quart of fld per day. Had IVF and meds last week for same reason. Agrees to try to have pt here by 1:00 for IVF

## 2015-10-05 NOTE — Telephone Encounter (Signed)
Called back to say they are unable to bring patient in and will try to give zofran before he takes his pain med and make sure he has something on his stomach before he takes pills. Asking if he can take more of the zofran than every 8 hours

## 2015-10-06 ENCOUNTER — Encounter: Payer: Self-pay | Admitting: Oncology

## 2015-10-06 DIAGNOSIS — C3412 Malignant neoplasm of upper lobe, left bronchus or lung: Secondary | ICD-10-CM | POA: Insufficient documentation

## 2015-10-06 HISTORY — DX: Malignant neoplasm of upper lobe, left bronchus or lung: C34.12

## 2015-10-06 NOTE — Progress Notes (Signed)
[place] @ [facility] [phone]  Fax:([phone]) [place]: 11/10/1934  MR#: [id number]  JGG#:[id number]  Patient Care Team: Pcp Not In System as PCP - General Carloyn Manner, MD as Referring Physician (Otolaryngology)  CHIEF COMPLAINT: Oncology history Abnormal CT scan of the chest (November, 2016) Irregular 3.2 x 2.5 left upper lobe lung mass.  Ipsilateral peribronchial and ipsilateral hilar lymphadenopathy.  Right upper lobe supper solid pulmonary nodule.  Multiple pulmonary nodules stress pleural effusion./.  2.  Biopsy of abdominal wall nodule was poorly differentiated   Carcinoma  . CK 7 positive,\TTF negative. Clinically consistent with lung primary  PET scan shows multiple bone metastases T2 N2 M1 B stage IV disease metastases to the bone and abdominal wall Chief Complaint  Patient presents with  . Lung Cancer    VISIT DIAGNOSIS:     ICD-9-CM ICD-10-CM   1. Malignant neoplasm of lung, unspecified laterality, unspecified part of lung (HCC) 162.9 C34.90 HYDROcodone-acetaminophen (NORCO/VICODIN) 5-325 MG tablet     predniSONE (DELTASONE) 20 MG tablet     MR Brain W Wo Contrast     CBC with Differential     Comprehensive metabolic panel      No history exists.    Oncology Flowsheet 05/05/2012 05/05/2012 12/26/2014 10/02/2015  dexamethasone (DECADRON) IV - - - [ 10 mg ]  ondansetron (ZOFRAN) IV 4 mg 4 mg 4 mg [ 8 mg ]    INTERVAL HISTORY:  80 year old gentleman started having discomfort in the chest.  Patient went to emergency room in [facility] followed by [facility].  Multiple admission.  Patient has a history of atrial fibrillation underwent several times for defibrillation.  Without much success on chronic Coumadin therapy.  Patient continues to complain of dry hacking cough.  ,hoarseness of voice. Patient was evaluated by ENT surgeon  Dr. Pryor Ochoa and the left vocal cord paralysis was found. CT scan was  ordered which revealed a lung mass and PET scan revealed multiple bone metastases.  Patient was referred to me for further evaluation and treatment consideration  Biopsy of abdominal wall nodule was positive for poorly differentiated carcinoma CASE   was discussed in tumor conference.  Patient continues to a persistent nausea vomiting.   REVIEW OF SYSTEMS:   Gen. status: Patient is feeling weak and tired. HEENT: Hoarseness of voice. Lungs: Shortness of breath.  Dry hacking cough.  Patient has a squeezing pain in both chest wall area. GI: Poor appetite.  No nausea no vomiting no diarrhea. GU: No dysuria hematuria Skin: No rash Neurological system no headache no dizziness.  No other focal symptoms Musculoskeletal system back pain. Lower extremity intermittent swelling Cardiac history of atrial fibrillation on chronic Coumadin therapy Psychiatric system: No evidence of abnormality  As per HPI. Otherwise, a complete review of systems is negatve.  PAST MEDICAL HISTORY: Past Medical History  Diagnosis Date  . Hypertension   . Coronary artery disease   . Atrial fibrillation ([place]) 07/2015    PAST SURGICAL HISTORY: Past Surgical History  Procedure Laterality Date  . Coronary artery bypass graft  12/10/2005    LIMA to LAD,SVG to diagonal,SVG to ramus intermediate,SVG to distal RCA.  . Tonsillectomy    . Tee without cardioversion  05/10/2012    Procedure: TRANSESOPHAGEAL ECHOCARDIOGRAM (TEE);  Surgeon: [name], MD;  Location: [facility] ENDOSCOPY;  Service: Cardiovascular;  Laterality: N/A;  . Cardioversion  05/10/2012    Procedure: CARDIOVERSION;  Surgeon: [name],  MD;  Location: [facility] ENDOSCOPY;  Service: Cardiovascular;  Laterality: N/A;  . Cardiac catheterization  10/28/2005    significant 3 vessel CAD  . Nm myocar perf wall motion  09/16/2005    evidence of diaphragmatic attenuation & subtle anterolateral ischemia.  . Cardioversion N/A 07/31/2015    Procedure: CARDIOVERSION;   Surgeon: Josue Hector, MD;  Location: [facility] ENDOSCOPY;  Service: Cardiovascular;  Laterality: N/A;    FAMILY HISTORY Family History  Problem Relation Age of Onset  . Stroke Mother 64  . Stroke Father 102  . Heart attack Father 104  . Heart attack Brother 4  . Cancer Sister 79       ADVANCED DIRECTIVES:  Patient does have advance healthcare directive, Patient   does not desire to make any changes  HEALTH MAINTENANCE: Social History  Substance Use Topics  . Smoking status: Former Smoker -- 1.00 packs/day for 40 years    Types: Cigarettes    Quit date: 09/19/2005  . Smokeless tobacco: Never Used  . Alcohol Use: No      Allergies  Allergen Reactions  . Clonidine Derivatives Other (See Comments)    Pt was not aware of a reaction to this medication  . Diltiazem Hcl Er Other (See Comments)    High blood pressure  . Statins Other (See Comments)    Myalgias:  Previously on Crestor,Vytorin,Simvastatin,Lipitor & Pravastatin  . Sulfa Antibiotics Swelling    Current Outpatient Prescriptions  Medication Sig Dispense Refill  . aspirin EC 81 MG tablet Take 81 mg by mouth daily.    Marland Kitchen lisinopril (PRINIVIL,ZESTRIL) 10 MG tablet Take 1 tablet (10 mg total) by mouth daily. 90 tablet 3  . ondansetron (ZOFRAN) 4 MG tablet Take 1 tablet (4 mg total) by mouth every 8 (eight) hours as needed for nausea or vomiting. 30 tablet 0  . Tamsulosin HCl (FLOMAX) 0.4 MG CAPS Take 0.4 mg by mouth daily after supper.    . warfarin (COUMADIN) 5 MG tablet Take 1 tablet by mouth daily or as directed by coumadin clinic.  Need MD appt for further refills (Patient taking differently: Take 2.5-5 mg by mouth daily after supper. Take 1/2 tablet (2.5 mg) by mouth on Monday, Wednesday, Friday, take 1 tablet (5 mg) on Sunday, Tuesday, Thursday, Saturday or as directed by coumadin clinic) 90 tablet 0  . HYDROcodone-acetaminophen (NORCO/VICODIN) 5-325 MG tablet Take 1 tablet by mouth every 6 (six) hours as needed for  moderate pain. 60 tablet 0  . metoprolol succinate (TOPROL-XL) 50 MG 24 hr tablet Take 1 tablet (50 mg total) by mouth daily. (Patient not taking: Reported on 10/02/2015) 90 tablet 3  . predniSONE (DELTASONE) 20 MG tablet Take 1 tablet (20 mg total) by mouth daily with breakfast. 30 tablet 3   No current facility-administered medications for this visit.    OBJECTIVE: PHYSICAL EXAM: Gen. status: Patient is alert oriented not any acute distress Lymphatic system: Supraclavicular, cervical, axillary, inguinal lymph nodes are not palpable HEENT: No evidence of abnormality Lungs: Emphysematous chest diminished air entry on both sides. Cardiac: Irregular heart sounds GI: Abdomen is soft liver spleen not palpable there is palpable nodule in mid abdominal area 2 cm x 1 cm Lower extremity no edema Skin: No rash Neurological system: Higher functions within normal limit.  Cranial nodes are intact.  No other focal signs Psychiatric system within normal limit.    Filed Vitals:   10/02/15 1347  BP: 100/66  Pulse: 109  Temp: 97 F (36.1 C)  Body mass index is 31.79 kg/(m^2).    ECOG FS:1 - Symptomatic but completely ambulatory  LAB RESULTS:  No visits with results within 5 Day(s) from this visit. Latest known visit with results is:  Hospital Outpatient Visit on 09/25/2015  Component Date Value Ref Range Status  . aPTT 09/25/2015 32  24 - 36 seconds Final  . SURGICAL PATHOLOGY 09/25/2015    Final                   Value:Surgical Pathology CASE: [id number] PATIENT: Lynelle Smoke Surgical Pathology Report     SPECIMEN SUBMITTED: A. Abominal wall, left, subcutaneous  CLINICAL HISTORY: None provided  PRE-OPERATIVE DIAGNOSIS: None Provided  POST-OPERATIVE DIAGNOSIS: None provided     DIAGNOSIS: A. LEFT ABDOMINAL WALL, SUBCUTANEOUS; BIOPSY: - METASTATIC CARCINOMA, SEE COMMENT.  Comment: The tumor is poorly differentiated with spindle cell features, with expression  of CK7 supporting the above interpretation. The features are compatible with lung origin given the clinical presentation, but are not specific for the site. Other immunohistochemical stains obtained are negative which include p40, TTF-1, cytokeratin 20, and Napsin A. The control slides stained appropriately.   GROSS DESCRIPTION:  A. Labeled: left abdominal subcutaneous nodule biopsy  Tissue fragment(s): multiple cores/fragments  Size: aggregate 3.2 x 0.2 x 0.1 cm  Description: soft tan to pink cores                          and fragments, wrapped in lens paper, marked orange and submitted in a mesh bag  Entirely submitted in 1-2 cassette(s).    Final Diagnosis performed by Delorse Lek, MD.  Electronically signed 10/01/2015 1:18:14PM    The electronic signature indicates that the named Attending Pathologist has evaluated the specimen  Technical component performed at [facility], [address], [place], [place] [address] Lab: [phone] Dir: Darrick Penna. Evette Doffing, MD  Professional component performed at [facility], [facility], [address], [place], [place] [address] Lab: [phone] Dir: Dellia Nims. Rubinas, MD       STUDIES: Ct Soft Tissue Neck W Contrast  09/14/2015  CLINICAL DATA:  Vocal cord paralysis after cardioversion 1 month ago. Right-sided chest pain. EXAM: CT NECK WITH CONTRAST TECHNIQUE: Multidetector CT imaging of the neck was performed using the standard protocol following the bolus administration of intravenous contrast. CONTRAST:  45m OMNIPAQUE IOHEXOL 300 MG/ML  SOLN COMPARISON:  None. FINDINGS: Pharynx and larynx: No evidence of mucosal or submucosal lesion. There are findings consistent with vocal cord paresis on the left with a "Sail sign" . No definable laryngeal mass, though a small mass or polyp of the left vocal cord could simulate this appearance. Salivary glands: Normal appearance of the parotid and submandibular glands. Thyroid:  Normal Lymph nodes: No enlarged or low-density nodes on either side of the neck. Vascular: Jugular veins are patent with the left being dominant. There is atherosclerosis of the aorta and of the carotid bifurcation regions. This study was not done in the form of a a CT angiogram, but stenosis in both ICA regions approaches 50% with soft and calcified plaque. Limited intracranial: Normal Visualized orbits: Limited visualization common normal Mastoids and visualized paranasal sinuses: Clear Skeleton: Normal Upper chest: See results of chest CT. Worrisome for 3 cm mass in the left upper lobe with abnormal soft tissue in the left superior mediastinum that could represent metastatic disease, possibly affecting the recurrent laryngeal nerve. 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 Left  upper lobe lung mass with changes worrisome for metastatic disease to the mediastinum which could affect the recurrent laryngeal nerve on the left. See results of chest CT. Electronically Signed   By: Nelson Chimes M.D.   On: 09/14/2015 09:53   Ct Chest W Contrast  09/14/2015  CLINICAL DATA:  Right chest pain. Recent defibrillation for arrhythmia. EXAM: CT CHEST WITH CONTRAST TECHNIQUE: Multidetector CT imaging of the chest was performed during intravenous contrast administration. CONTRAST:  17m OMNIPAQUE IOHEXOL 300 MG/ML  SOLN COMPARISON:  07/29/2015 chest radiograph FINDINGS: Mediastinum/Nodes: Mild cardiomegaly. No pericardial fluid/thickening. Left main, left anterior descending, left circumflex and right coronary atherosclerosis. Great vessels are normal in course and caliber. No central pulmonary emboli. Normal visualized thyroid. Normal esophagus. No axillary adenopathy. There is a mildly enlarged 1.4 cm subcarinal node (series 3/ image 29). There is a mildly enlarged 1.1 cm right paratracheal node (3/23). There is confluent left hilar and ipsilateral left upper lobe peribronchial adenopathy, for example a mildly  enlarged 1.4 cm left hilar node (3/25). There is irregular infiltrative aortopulmonary window and left prevascular mediastinal adenopathy, which is confluent with the left hilar adenopathy, for example a 2.5 cm enlarged AP window node (3/22). No right hilar adenopathy. Lungs/Pleura: No pneumothorax. There is a trace left pleural effusion. No right pleural effusion. There is an irregular 3.2 x 2.5 cm posterior medial left upper lobe lung mass (series 4/ image 17) abutting and distorting the superior aspect of the left major fissure and abutting the mediastinal pleural surface, with occlusion of the associated segmental left upper lobe airway. There is a 0.7 cm anterior medial right upper lobe solid pulmonary nodule (series 4/ image 23). There is a subsolid 2.3 x 1.5 cm right upper lobe pulmonary nodule (4/28) with a 5 mm solid component. There is a 4 mm solid left upper lobe pulmonary nodule (4/18). There is an 8 mm solid pulmonary nodule in the lingula (4/39). There is mild patchy ground-glass opacity throughout the left upper lobe. There is a 3 mm left lower lobe solid pulmonary nodule (4/45). Upper abdomen: Simple 2.3 cm left liver lobe cyst. There are at least 4 subcentimeter hypodense lesions scattered throughout the visualized liver, too small to characterize. Musculoskeletal: No aggressive appearing focal osseous lesions. Mild degenerative changes in the thoracic spine. There is a mildly displaced subacute lateral right seventh rib fracture with slight periosteal callus formation suggesting early healing response. No definite bone lesion is seen at the fracture site. IMPRESSION: 1. Irregular 3.2 x 2.5 cm posteromedial left upper lobe lung mass abutting the left major fissure and mediastinal pleural surface, with occlusion of the associated segmental left upper lobe airway, in keeping with a primary bronchogenic carcinoma. Scattered ground-glass opacity in the left upper lobe, in keeping with mild  postobstructive atelectasis/pneumonitis. 2. Ipsilateral peribronchial and ipsilateral hilar lymphadenopathy. Aortopulmonary window, subcarinal and contralateral paratracheal mediastinal lymphadenopathy. 3. Right upper lobe subsolid 2.3 x 1.5 cm pulmonary nodule, indeterminate, cannot exclude a synchronous primary bronchogenic carcinoma versus contralateral metastasis. 4. Four additional subcentimeter solid pulmonary nodules in both lungs, cannot exclude pulmonary metastases. 5. Trace left pleural effusion. 6. At least 4 scattered subcentimeter hypodense liver lesions, too small to characterize, liver metastases not excluded. 7. Subacute mildly displaced lateral right seventh rib fracture. No definite pathologic bone lesions detected. 8. Recommend thoracic surgical and oncology consultation and further staging with PET-CT. These results will be called to the ordering clinician or representative by the Radiologist Assistant, and communication documented in the  PACS or zVision Dashboard. Electronically Signed   By: Ilona Sorrel M.D.   On: 09/14/2015 10:54   Nm Pet Image Initial (pi) Skull Base To Thigh  09/19/2015  CLINICAL DATA:  Initial treatment strategy for lung mass. EXAM: NUCLEAR MEDICINE PET SKULL BASE TO THIGH TECHNIQUE: 12.7 mCi F-18 FDG was injected intravenously. Full-ring PET imaging was performed from the skull base to thigh after the radiotracer. CT data was obtained and used for attenuation correction and anatomic localization. FASTING BLOOD GLUCOSE:  Value: 118 mg/dl COMPARISON:  Chest CT 09/14/2015. CT the abdomen and pelvis 12/27/2014. Neck CT 09/14/2015. FINDINGS: NECK No hypermetabolic lymph nodes in the neck. CHEST 2.8 x 2.8 cm left upper lobe nodule is hypermetabolic (SUVmax = 9.9), and makes contact with the overlying pleura. There is extensive hypermetabolic left hilar lymphadenopathy (SUVmax = 16 point to), which is contiguous with multiple adjacent nonenlarged and mildly enlarged but very  hypermetabolic mediastinal lymph nodes throughout the prevascular, low left paratracheal and subcarinal nodal stations. Subcarinal lymph node measures 13 mm in short axis (SUVmax = 10.1). There is also nonenlarged but hypermetabolic right hilar lymphadenopathy (SUVmax = 8.3). Nonenlarged but hypermetabolic superior mediastinal and left supraclavicular lymph nodes are also noted (SUVmax = 6.0). 8 mm nodule in the anterior aspect of the right upper lobe (image 92 of series 4) is also a hypermetabolic (SUVmax = 4.4), presumably metastatic. Heart size is mildly enlarged with right atrial dilatation. There is no significant pericardial fluid, thickening or pericardial calcification. There is atherosclerosis of the thoracic aorta, the great vessels of the mediastinum and the coronary arteries, including calcified atherosclerotic plaque in the left main, left anterior descending, left circumflex and right coronary arteries. Status post median sternotomy for CABG, including LIMA to the LAD. Calcifications of the aortic valve. ABDOMEN/PELVIS Small focus of hypermetabolism (SUVmax = 6.6)in segment 7 of the liver without corresponding CT abnormality. 1.4 cm left adrenal nodule is hypermetabolic (SUVmax = 4.4). No abnormal hypermetabolic activity within the pancreas, right adrenal gland, or spleen. No hypermetabolic lymph nodes in the abdomen or pelvis. SKELETON Multiple areas of hypermetabolism in the skeletal musculature, compatible with soft tissue metastasis, including a lesion in the right subscapularis muscle (SUVmax = 7.6), and a lesion in the left anterior abdominal wall musculature (SUVmax = 7.2). In addition, there are innumerable areas of hypermetabolism throughout the visualized axial and appendicular skeleton. The majority of these lesions are occult by CT imaging. Some of these lesions include an expansile lesion with minimally displaced pathologic fracture (SUVmax = 8.7) in the lateral aspect of the right seventh  rib (image 111 of series 4), a large diffusely infiltrative hypermetabolic (SUVmax = 26.8)TMHDQQ in L1, and a large lesion in the left ilium (SUVmax = 17.9). Multiple other lesions are noted, including bilateral femoral neck lesions. IMPRESSION: 1. 2.8 cm hypermetabolic left upper lobe pulmonary nodule with bilateral hilar and mediastinal lymphadenopathy, metastatic lesions in right upper lobe of the lung, the segment 7 of the liver and in the left adrenal gland, multiple skeletal muscle metastasis, as well as metastatic lesions throughout all aspects of the visualized axial and appendicular skeleton, as discussed above, most likely to represent stage IV lung cancer. 2. Additional incidental findings, as above. Electronically Signed   By: Vinnie Langton M.D.   On: 09/19/2015 11:53   US Biopsy  09/25/2015  INDICATION: History of presumed metastatic lung cancer. Please perform ultrasound-guided biopsy of hypermetabolic subcutaneous nodule within the ventral mid lateral abdominal wall for tissue diagnostic  purposes EXAM: ULTRASOUND GUIDED SUBCUTANEOUS NODULE BIOPSY COMPARISON:  PET-CT - 09/19/2015 MEDICATIONS: None ANESTHESIA/SEDATION: None COMPLICATIONS: None immediate PROCEDURE: Informed written consent was obtained from the patient after a discussion of the risks, benefits and alternatives to treatment. The patient understands and consents the procedure. A timeout was performed prior to the initiation of the procedure. Ultrasound scanning was performed of the left mid lateral ventral abdominal wall demonstrates an approximately 1.1 x 1.1 by 1.5 cm mixed echogenic nodule within the subcutaneous tissues which correlates with the hypermetabolic nodule seen on preceding PET-CT (representative images 3 and 7). The procedure was planned. The skin overlying the ventral left mid lateral abdomen was prepped and draped in the usual sterile fashion. The overlying soft tissues were anesthetized with 1% lidocaine with  epinephrine. This was followed by 6 core biopsies with an 18 gauge core device under direct ultrasound guidance. Multiple ultrasound images were saved for procedural documentation purposes. Superficial hemostasis was obtained with manual compression. Post procedural scanning was negative for definitive area of hemorrhage or additional complication. A dressing was placed. The patient tolerated the procedure well without immediate post procedural complication. IMPRESSION: Technically successful ultrasound guided core needle biopsy of indeterminate hypermetabolic nodule within the subcutaneous tissues about the left mid lateral ventral abdominal wall. Electronically Signed   By: Sandi Mariscal M.D.   On: 09/25/2015 16:05    ASSESSMENT: CT scan of the chest and PET scan has been reviewed independently and reviewed with the patient. Clinical suspicion is carcinoma of lung which is T2 N2 M1 disease based on PET scan. Biopsies consistent with poorly differentiated carcinoma  Case was discussed in tumor conference  Reason for persistent nausea is not clear if it persists then CT scan of brain would be done to rule out any metastases to the brain Patient received today IV fluid IV Decadron and Zofran Started on 20 mg of prednisone Start patient on carboplatinum and Taxol chemotherapy  Send biopsy for PDL 1 assessment as well as for molecular markers  Patient was explained all the side effects of chemotherapy.  And informed consent has been obtained All the side effects of chemotherapy including myelosuppression, alopecia, nausea vomiting fatigue weakness.  Secondary infection, and   peripheral neuropathy .  Has been discussed in details. Informal consent has been obtained and will be documented by nurses in the chart Intent of chemotherapy is palliation and relief in symptoms and extending survival.  All lab data has been reviewed. Patient will Go to chemotherapy class as well as discuss port  placement Total duration of visit was 45 minutes.  50% or more time was spent in counseling patient and family regarding prognosis and options of treatment and available resources  PLAN:  No problem-specific assessment & plan notes found for this encounter.   Patient expressed understanding and was in agreement with this plan. He also understands that He can call clinic at any time with any questions, concerns, or complaints.    No matching staging information was found for the patient.  Forest Gleason, MD   10/06/2015 8:01 AM

## 2015-10-08 ENCOUNTER — Encounter: Payer: Self-pay | Admitting: Oncology

## 2015-10-08 ENCOUNTER — Inpatient Hospital Stay: Payer: Medicare Other

## 2015-10-08 ENCOUNTER — Inpatient Hospital Stay (HOSPITAL_BASED_OUTPATIENT_CLINIC_OR_DEPARTMENT_OTHER): Payer: Medicare Other | Admitting: Oncology

## 2015-10-08 VITALS — BP 109/77 | HR 18 | Temp 96.8°F | Resp 18 | Wt 194.0 lb

## 2015-10-08 VITALS — BP 111/76 | HR 88

## 2015-10-08 DIAGNOSIS — R05 Cough: Secondary | ICD-10-CM

## 2015-10-08 DIAGNOSIS — C3490 Malignant neoplasm of unspecified part of unspecified bronchus or lung: Secondary | ICD-10-CM

## 2015-10-08 DIAGNOSIS — J90 Pleural effusion, not elsewhere classified: Secondary | ICD-10-CM

## 2015-10-08 DIAGNOSIS — Z5181 Encounter for therapeutic drug level monitoring: Secondary | ICD-10-CM

## 2015-10-08 DIAGNOSIS — R229 Localized swelling, mass and lump, unspecified: Secondary | ICD-10-CM

## 2015-10-08 DIAGNOSIS — Z5111 Encounter for antineoplastic chemotherapy: Secondary | ICD-10-CM | POA: Diagnosis not present

## 2015-10-08 DIAGNOSIS — C7989 Secondary malignant neoplasm of other specified sites: Secondary | ICD-10-CM

## 2015-10-08 DIAGNOSIS — R918 Other nonspecific abnormal finding of lung field: Secondary | ICD-10-CM | POA: Diagnosis not present

## 2015-10-08 DIAGNOSIS — C3412 Malignant neoplasm of upper lobe, left bronchus or lung: Secondary | ICD-10-CM

## 2015-10-08 DIAGNOSIS — C7951 Secondary malignant neoplasm of bone: Secondary | ICD-10-CM

## 2015-10-08 DIAGNOSIS — R0789 Other chest pain: Secondary | ICD-10-CM

## 2015-10-08 DIAGNOSIS — R350 Frequency of micturition: Secondary | ICD-10-CM

## 2015-10-08 DIAGNOSIS — R531 Weakness: Secondary | ICD-10-CM

## 2015-10-08 DIAGNOSIS — Z87891 Personal history of nicotine dependence: Secondary | ICD-10-CM

## 2015-10-08 DIAGNOSIS — R5383 Other fatigue: Secondary | ICD-10-CM

## 2015-10-08 DIAGNOSIS — I4891 Unspecified atrial fibrillation: Secondary | ICD-10-CM

## 2015-10-08 DIAGNOSIS — J3801 Paralysis of vocal cords and larynx, unilateral: Secondary | ICD-10-CM

## 2015-10-08 LAB — URINALYSIS COMPLETE WITH MICROSCOPIC (ARMC ONLY)
Bilirubin Urine: NEGATIVE
GLUCOSE, UA: NEGATIVE mg/dL
Hgb urine dipstick: NEGATIVE
KETONES UR: NEGATIVE mg/dL
LEUKOCYTES UA: NEGATIVE
Nitrite: NEGATIVE
Protein, ur: NEGATIVE mg/dL
SPECIFIC GRAVITY, URINE: 1.016 (ref 1.005–1.030)
Squamous Epithelial / LPF: NONE SEEN
pH: 6 (ref 5.0–8.0)

## 2015-10-08 LAB — CBC WITH DIFFERENTIAL/PLATELET
BASOS ABS: 0 10*3/uL (ref 0–0.1)
Basophils Relative: 0 %
EOS PCT: 1 %
Eosinophils Absolute: 0.1 10*3/uL (ref 0–0.7)
HEMATOCRIT: 49.2 % (ref 40.0–52.0)
Hemoglobin: 16.5 g/dL (ref 13.0–18.0)
LYMPHS ABS: 0.9 10*3/uL — AB (ref 1.0–3.6)
LYMPHS PCT: 4 %
MCH: 27.3 pg (ref 26.0–34.0)
MCHC: 33.5 g/dL (ref 32.0–36.0)
MCV: 81.6 fL (ref 80.0–100.0)
Monocytes Absolute: 0.9 10*3/uL (ref 0.2–1.0)
Monocytes Relative: 4 %
NEUTROS ABS: 19.9 10*3/uL — AB (ref 1.4–6.5)
Neutrophils Relative %: 91 %
Platelets: 289 10*3/uL (ref 150–440)
RBC: 6.03 MIL/uL — AB (ref 4.40–5.90)
RDW: 14.6 % — ABNORMAL HIGH (ref 11.5–14.5)
WBC: 21.9 10*3/uL — AB (ref 3.8–10.6)

## 2015-10-08 LAB — COMPREHENSIVE METABOLIC PANEL
ALBUMIN: 3.5 g/dL (ref 3.5–5.0)
ALT: 47 U/L (ref 17–63)
AST: 27 U/L (ref 15–41)
Alkaline Phosphatase: 96 U/L (ref 38–126)
Anion gap: 11 (ref 5–15)
BILIRUBIN TOTAL: 0.5 mg/dL (ref 0.3–1.2)
BUN: 54 mg/dL — AB (ref 6–20)
CHLORIDE: 93 mmol/L — AB (ref 101–111)
CO2: 26 mmol/L (ref 22–32)
CREATININE: 1.89 mg/dL — AB (ref 0.61–1.24)
Calcium: 11.2 mg/dL — ABNORMAL HIGH (ref 8.9–10.3)
GFR calc Af Amer: 38 mL/min — ABNORMAL LOW (ref >60)
GFR, EST NON AFRICAN AMERICAN: 32 mL/min — AB (ref >60)
GLUCOSE: 129 mg/dL — AB (ref 65–99)
POTASSIUM: 4.6 mmol/L (ref 3.5–5.1)
Sodium: 130 mmol/L — ABNORMAL LOW (ref 135–145)
Total Protein: 6.9 g/dL (ref 6.5–8.1)

## 2015-10-08 MED ORDER — DIPHENHYDRAMINE HCL 50 MG/ML IJ SOLN
50.0000 mg | Freq: Once | INTRAMUSCULAR | Status: AC
  Administered 2015-10-08: 50 mg via INTRAVENOUS
  Filled 2015-10-08: qty 1

## 2015-10-08 MED ORDER — DENOSUMAB 120 MG/1.7ML ~~LOC~~ SOLN
120.0000 mg | Freq: Once | SUBCUTANEOUS | Status: AC
Start: 2015-10-08 — End: 2015-10-08
  Administered 2015-10-08: 120 mg via SUBCUTANEOUS
  Filled 2015-10-08: qty 1.7

## 2015-10-08 MED ORDER — FAMOTIDINE IN NACL 20-0.9 MG/50ML-% IV SOLN
20.0000 mg | Freq: Once | INTRAVENOUS | Status: AC
  Administered 2015-10-08: 20 mg via INTRAVENOUS
  Filled 2015-10-08: qty 50

## 2015-10-08 MED ORDER — OMEPRAZOLE 20 MG PO CPDR: 20.0000 mg | DELAYED_RELEASE_CAPSULE | Freq: Two times a day (BID) | ORAL | Status: DC

## 2015-10-08 MED ORDER — FOSAPREPITANT DIMEGLUMINE INJECTION 150 MG
Freq: Once | INTRAVENOUS | Status: AC
  Administered 2015-10-08: 12:00:00 via INTRAVENOUS
  Filled 2015-10-08: qty 5

## 2015-10-08 MED ORDER — SODIUM CHLORIDE 0.9 % IV SOLN
328.5000 mg | Freq: Once | INTRAVENOUS | Status: AC
  Administered 2015-10-08: 330 mg via INTRAVENOUS
  Filled 2015-10-08: qty 33

## 2015-10-08 MED ORDER — DEXTROSE 5 % IV SOLN
100.0000 mg/m2 | Freq: Once | INTRAVENOUS | Status: AC
  Administered 2015-10-08: 204 mg via INTRAVENOUS
  Filled 2015-10-08: qty 34

## 2015-10-08 MED ORDER — SODIUM CHLORIDE 0.9 % IV SOLN
Freq: Once | INTRAVENOUS | Status: AC
  Administered 2015-10-08: 12:00:00 via INTRAVENOUS
  Filled 2015-10-08: qty 1000

## 2015-10-08 MED ORDER — PALONOSETRON HCL INJECTION 0.25 MG/5ML
0.2500 mg | Freq: Once | INTRAVENOUS | Status: AC
  Administered 2015-10-08: 0.25 mg via INTRAVENOUS
  Filled 2015-10-08: qty 5

## 2015-10-08 MED ORDER — FENTANYL 12 MCG/HR TD PT72: 12.5000 ug | MEDICATED_PATCH | TRANSDERMAL | Status: DC

## 2015-10-08 NOTE — Progress Notes (Signed)
Patient here today for first chemo.  States he is in constant pain that is not controlled.  When he takes Vicodin it works for about 2 hours but he cannot take it but every 6 hours.  Also states he is hurting in his back and right axilla area.  He had a nodule in his left abdomen at biopsy site that has increased in size.  Accompanied by family today who states he has increased weakness as well. Patient states he only sleeps about 2 hrs a night.  States he is nauseated.  States he has been constipated.  Used miralax.

## 2015-10-08 NOTE — Progress Notes (Signed)
[place] @ [facility] [phone]  Fax:([phone]) [place]: May 05, 1935  MR#: [id number]  FUX#:[id number]  Patient Care Team: Pcp Not In System as PCP - General Carloyn Manner, MD as Referring Physician (Otolaryngology)  CHIEF COMPLAINT: Oncology history Abnormal CT scan of the chest (November, 2016) Irregular 3.2 x 2.5 left upper lobe lung mass.  Ipsilateral peribronchial and ipsilateral hilar lymphadenopathy.  Right upper lobe supper solid pulmonary nodule.  Multiple pulmonary nodules stress pleural effusion./.  2.  Biopsy of abdominal wall nodule was poorly differentiated   Carcinoma  . CK 7 positive,\TTF negative. Clinically consistent with lung primary  PET scan shows multiple bone metastases T2 N2 M1 B stage IV disease metastases to the bone and abdominal wall Chief Complaint  Patient presents with  . Lung Cancer    VISIT DIAGNOSIS:   No diagnosis found.    No history exists.    Oncology Flowsheet 05/05/2012 05/05/2012 12/26/2014 10/02/2015  dexamethasone (DECADRON) IV - - - [ 10 mg ]  ondansetron (ZOFRAN) IV 4 mg 4 mg 4 mg [ 8 mg ]    INTERVAL HISTORY:  80 year old gentleman started having discomfort in the chest.  Patient went to emergency room in [facility] followed by [facility].  Multiple admission.  Patient has a history of atrial fibrillation underwent several times for defibrillation.  Without much success on chronic Coumadin therapy.  Patient continues to complain of dry hacking cough.  ,hoarseness of voice. Patient was evaluated by ENT surgeon  Dr. Pryor Ochoa and the left vocal cord paralysis was found. CT scan was ordered which revealed a lung mass and PET scan revealed multiple bone metastases.  Patient was referred to me for further evaluation and treatment consideration  Biopsy of abdominal wall nodule was positive for poorly differentiated carcinoma CASE   was discussed in tumor conference.  Patient  continues to a persistent nausea vomiting.   October 07, 2018  and is here to initiate chemotherapy continues to have increasing pain but not taking Vicodin round-the-clock.  Taking 4 tablets in 24 hours.  Has increased frequency of passing urine.  Not able to sleep in the night.  According to wife nausea is improved but appetite continues to be poor.  MRI scan of brain has been scheduled on  Wednesday across the street  Patient is here for ongoing evaluation and treatment consideration appears to be extremely apprehensive    REVIEW OF SYSTEMS:   Gen. status: Patient is feeling weak and tired.  not able to sleep because of frequency of passing urine already on Flomax HEENT: Hoarseness of voice. Lungs: Shortness of breath.  Dry hacking cough.  Patient has a squeezing pain in both chest wall area. GI: Poor appetite.  No nausea no vomiting no diarrhea. GU: No dysuria hematuria, as to get up every 2 hours to pass urine on Flomax Skin: No rash Neurological system no headache no dizziness.  No other focal symptoms Musculoskeletal system back pain. Patient is not taking medication round-the-clock for pain control. Lower extremity intermittent swelling Cardiac history of atrial fibrillation on chronic Coumadin therapy Psychiatric system: No evidence of abnormality  As per HPI. Otherwise, a complete review of systems is negatve.  PAST MEDICAL HISTORY: Past Medical History  Diagnosis Date  . Hypertension   . Coronary artery disease   . Atrial fibrillation ([place]) 07/2015  . Primary cancer of left upper lobe of lung ([place]) 10/06/2015    PAST SURGICAL HISTORY: Past  Surgical History  Procedure Laterality Date  . Coronary artery bypass graft  12/10/2005    LIMA to LAD,SVG to diagonal,SVG to ramus intermediate,SVG to distal RCA.  . Tonsillectomy    . Tee without cardioversion  05/10/2012    Procedure: TRANSESOPHAGEAL ECHOCARDIOGRAM (TEE);  Surgeon: Chrystie Nose, MD;  Location: [facility]  ENDOSCOPY;  Service: Cardiovascular;  Laterality: N/A;  . Cardioversion  05/10/2012    Procedure: CARDIOVERSION;  Surgeon: Chrystie Nose, MD;  Location: [facility] ENDOSCOPY;  Service: Cardiovascular;  Laterality: N/A;  . Cardiac catheterization  10/28/2005    significant 3 vessel CAD  . Nm myocar perf wall motion  09/16/2005    evidence of diaphragmatic attenuation & subtle anterolateral ischemia.  . Cardioversion N/A 07/31/2015    Procedure: CARDIOVERSION;  Surgeon: Wendall Stade, MD;  Location: [facility] ENDOSCOPY;  Service: Cardiovascular;  Laterality: N/A;    FAMILY HISTORY Family History  Problem Relation Age of Onset  . Stroke Mother 44  . Stroke Father 30  . Heart attack Father 31  . Heart attack Brother 50  . Cancer Sister 26       ADVANCED DIRECTIVES:  Patient does have advance healthcare directive, Patient   does not desire to make any changes  HEALTH MAINTENANCE: Social History  Substance Use Topics  . Smoking status: Former Smoker -- 1.00 packs/day for 40 years    Types: Cigarettes    Quit date: 09/19/2005  . Smokeless tobacco: Never Used  . Alcohol Use: No      Allergies  Allergen Reactions  . Clonidine Derivatives Other (See Comments)    Pt was not aware of a reaction to this medication  . Diltiazem Hcl Er Other (See Comments)    High blood pressure  . Statins Other (See Comments)    Myalgias:  Previously on Crestor,Vytorin,Simvastatin,Lipitor & Pravastatin  . Sulfa Antibiotics Swelling    Current Outpatient Prescriptions  Medication Sig Dispense Refill  . aspirin EC 81 MG tablet Take 81 mg by mouth daily.    Marland Kitchen HYDROcodone-acetaminophen (NORCO/VICODIN) 5-325 MG tablet Take 1 tablet by mouth every 6 (six) hours as needed for moderate pain. 60 tablet 0  . lisinopril (PRINIVIL,ZESTRIL) 10 MG tablet Take 1 tablet (10 mg total) by mouth daily. 90 tablet 3  . metoprolol succinate (TOPROL-XL) 50 MG 24 hr tablet Take 1 tablet (50 mg total) by mouth daily. 90 tablet 3    . ondansetron (ZOFRAN) 4 MG tablet Take 1 tablet (4 mg total) by mouth every 8 (eight) hours as needed for nausea or vomiting. 30 tablet 0  . predniSONE (DELTASONE) 20 MG tablet Take 1 tablet (20 mg total) by mouth daily with breakfast. 30 tablet 3  . Tamsulosin HCl (FLOMAX) 0.4 MG CAPS Take 0.4 mg by mouth daily after supper.    . warfarin (COUMADIN) 5 MG tablet Take 1 tablet by mouth daily or as directed by coumadin clinic.  Need MD appt for further refills (Patient taking differently: Take 2.5-5 mg by mouth daily after supper. Take 1/2 tablet (2.5 mg) by mouth on Monday, Wednesday, Friday, take 1 tablet (5 mg) on Sunday, Tuesday, Thursday, Saturday or as directed by coumadin clinic) 90 tablet 0   No current facility-administered medications for this visit.    OBJECTIVE: PHYSICAL EXAM: Gen. status: Patient is alert oriented not any acute distress Lymphatic system: Supraclavicular, cervical, axillary, inguinal lymph nodes are not palpable HEENT: No evidence of abnormality Lungs: Emphysematous chest diminished air entry on both sides. Cardiac: Irregular  heart sounds GI: Abdomen is soft liver spleen not palpable there is palpable nodule in mid abdominal area 2 cm x 1 cm Lower extremity no edema Skin: No rash Neurological system: Higher functions within normal limit.  Cranial nodes are intact.  No other focal signs Psychiatric system within normal limit.    Filed Vitals:   10/08/15 1023  BP: 109/77  Pulse: 18  Temp: 96.8 F (36 C)  Resp: 18     Body mass index is 31.33 kg/(m^2).    ECOG FS:1 - Symptomatic but completely ambulatory  LAB RESULTS:  Appointment on 10/08/2015  Component Date Value Ref Range Status  . WBC 10/08/2015 21.9* 3.8 - 10.6 K/uL Final  . RBC 10/08/2015 6.03* 4.40 - 5.90 MIL/uL Final  . Hemoglobin 10/08/2015 16.5  13.0 - 18.0 g/dL Final  . HCT 10/08/2015 49.2  40.0 - 52.0 % Final  . MCV 10/08/2015 81.6  80.0 - 100.0 fL Final  . MCH 10/08/2015 27.3  26.0 -  34.0 pg Final  . MCHC 10/08/2015 33.5  32.0 - 36.0 g/dL Final  . RDW 10/08/2015 14.6* 11.5 - 14.5 % Final  . Platelets 10/08/2015 289  150 - 440 K/uL Final  . Neutrophils Relative % 10/08/2015 91   Final  . Neutro Abs 10/08/2015 19.9* 1.4 - 6.5 K/uL Final  . Lymphocytes Relative 10/08/2015 4   Final  . Lymphs Abs 10/08/2015 0.9* 1.0 - 3.6 K/uL Final  . Monocytes Relative 10/08/2015 4   Final  . Monocytes Absolute 10/08/2015 0.9  0.2 - 1.0 K/uL Final  . Eosinophils Relative 10/08/2015 1   Final  . Eosinophils Absolute 10/08/2015 0.1  0 - 0.7 K/uL Final  . Basophils Relative 10/08/2015 0   Final  . Basophils Absolute 10/08/2015 0.0  0 - 0.1 K/uL Final  . Sodium 10/08/2015 130* 135 - 145 mmol/L Final  . Potassium 10/08/2015 4.6  3.5 - 5.1 mmol/L Final  . Chloride 10/08/2015 93* 101 - 111 mmol/L Final  . CO2 10/08/2015 26  22 - 32 mmol/L Final  . Glucose, Bld 10/08/2015 129* 65 - 99 mg/dL Final  . BUN 10/08/2015 54* 6 - 20 mg/dL Final  . Creatinine, Ser 10/08/2015 1.89* 0.61 - 1.24 mg/dL Final  . Calcium 10/08/2015 11.2* 8.9 - 10.3 mg/dL Final  . Total Protein 10/08/2015 6.9  6.5 - 8.1 g/dL Final  . Albumin 10/08/2015 3.5  3.5 - 5.0 g/dL Final  . AST 10/08/2015 27  15 - 41 U/L Final  . ALT 10/08/2015 47  17 - 63 U/L Final  . Alkaline Phosphatase 10/08/2015 96  38 - 126 U/L Final  . Total Bilirubin 10/08/2015 0.5  0.3 - 1.2 mg/dL Final  . GFR calc non Af Amer 10/08/2015 32* >60 mL/min Final  . GFR calc Af Amer 10/08/2015 38* >60 mL/min Final   Comment: (NOTE) The eGFR has been calculated using the CKD EPI equation. This calculation has not been validated in all clinical situations. eGFR's persistently <60 mL/min signify possible Chronic Kidney Disease.   . Anion gap 10/08/2015 11  5 - 15 Final     STUDIES: Ct Soft Tissue Neck W Contrast  09/14/2015  CLINICAL DATA:  Vocal cord paralysis after cardioversion 1 month ago. Right-sided chest pain. EXAM: CT NECK WITH CONTRAST  TECHNIQUE: Multidetector CT imaging of the neck was performed using the standard protocol following the bolus administration of intravenous contrast. CONTRAST:  74m OMNIPAQUE IOHEXOL 300 MG/ML  SOLN COMPARISON:  None. FINDINGS: Pharynx  and larynx: No evidence of mucosal or submucosal lesion. There are findings consistent with vocal cord paresis on the left with a "Sail sign" . No definable laryngeal mass, though a small mass or polyp of the left vocal cord could simulate this appearance. Salivary glands: Normal appearance of the parotid and submandibular glands. Thyroid: Normal Lymph nodes: No enlarged or low-density nodes on either side of the neck. Vascular: Jugular veins are patent with the left being dominant. There is atherosclerosis of the aorta and of the carotid bifurcation regions. This study was not done in the form of a a CT angiogram, but stenosis in both ICA regions approaches 50% with soft and calcified plaque. Limited intracranial: Normal Visualized orbits: Limited visualization common normal Mastoids and visualized paranasal sinuses: Clear Skeleton: Normal Upper chest: See results of chest CT. Worrisome for 3 cm mass in the left upper lobe with abnormal soft tissue in the left superior mediastinum that could represent metastatic disease, possibly affecting the recurrent laryngeal nerve. 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 Left upper lobe lung mass with changes worrisome for metastatic disease to the mediastinum which could affect the recurrent laryngeal nerve on the left. See results of chest CT. Electronically Signed   By: Nelson Chimes M.D.   On: 09/14/2015 09:53   Ct Chest W Contrast  09/14/2015  CLINICAL DATA:  Right chest pain. Recent defibrillation for arrhythmia. EXAM: CT CHEST WITH CONTRAST TECHNIQUE: Multidetector CT imaging of the chest was performed during intravenous contrast administration. CONTRAST:  48m OMNIPAQUE IOHEXOL 300 MG/ML  SOLN COMPARISON:  07/29/2015  chest radiograph FINDINGS: Mediastinum/Nodes: Mild cardiomegaly. No pericardial fluid/thickening. Left main, left anterior descending, left circumflex and right coronary atherosclerosis. Great vessels are normal in course and caliber. No central pulmonary emboli. Normal visualized thyroid. Normal esophagus. No axillary adenopathy. There is a mildly enlarged 1.4 cm subcarinal node (series 3/ image 29). There is a mildly enlarged 1.1 cm right paratracheal node (3/23). There is confluent left hilar and ipsilateral left upper lobe peribronchial adenopathy, for example a mildly enlarged 1.4 cm left hilar node (3/25). There is irregular infiltrative aortopulmonary window and left prevascular mediastinal adenopathy, which is confluent with the left hilar adenopathy, for example a 2.5 cm enlarged AP window node (3/22). No right hilar adenopathy. Lungs/Pleura: No pneumothorax. There is a trace left pleural effusion. No right pleural effusion. There is an irregular 3.2 x 2.5 cm posterior medial left upper lobe lung mass (series 4/ image 17) abutting and distorting the superior aspect of the left major fissure and abutting the mediastinal pleural surface, with occlusion of the associated segmental left upper lobe airway. There is a 0.7 cm anterior medial right upper lobe solid pulmonary nodule (series 4/ image 23). There is a subsolid 2.3 x 1.5 cm right upper lobe pulmonary nodule (4/28) with a 5 mm solid component. There is a 4 mm solid left upper lobe pulmonary nodule (4/18). There is an 8 mm solid pulmonary nodule in the lingula (4/39). There is mild patchy ground-glass opacity throughout the left upper lobe. There is a 3 mm left lower lobe solid pulmonary nodule (4/45). Upper abdomen: Simple 2.3 cm left liver lobe cyst. There are at least 4 subcentimeter hypodense lesions scattered throughout the visualized liver, too small to characterize. Musculoskeletal: No aggressive appearing focal osseous lesions. Mild degenerative  changes in the thoracic spine. There is a mildly displaced subacute lateral right seventh rib fracture with slight periosteal callus formation suggesting early healing response. No definite  bone lesion is seen at the fracture site. IMPRESSION: 1. Irregular 3.2 x 2.5 cm posteromedial left upper lobe lung mass abutting the left major fissure and mediastinal pleural surface, with occlusion of the associated segmental left upper lobe airway, in keeping with a primary bronchogenic carcinoma. Scattered ground-glass opacity in the left upper lobe, in keeping with mild postobstructive atelectasis/pneumonitis. 2. Ipsilateral peribronchial and ipsilateral hilar lymphadenopathy. Aortopulmonary window, subcarinal and contralateral paratracheal mediastinal lymphadenopathy. 3. Right upper lobe subsolid 2.3 x 1.5 cm pulmonary nodule, indeterminate, cannot exclude a synchronous primary bronchogenic carcinoma versus contralateral metastasis. 4. Four additional subcentimeter solid pulmonary nodules in both lungs, cannot exclude pulmonary metastases. 5. Trace left pleural effusion. 6. At least 4 scattered subcentimeter hypodense liver lesions, too small to characterize, liver metastases not excluded. 7. Subacute mildly displaced lateral right seventh rib fracture. No definite pathologic bone lesions detected. 8. Recommend thoracic surgical and oncology consultation and further staging with PET-CT. These results will be called to the ordering clinician or representative by the Radiologist Assistant, and communication documented in the PACS or zVision Dashboard. Electronically Signed   By: Ilona Sorrel M.D.   On: 09/14/2015 10:54   Nm Pet Image Initial (pi) Skull Base To Thigh  09/19/2015  CLINICAL DATA:  Initial treatment strategy for lung mass. EXAM: NUCLEAR MEDICINE PET SKULL BASE TO THIGH TECHNIQUE: 12.7 mCi F-18 FDG was injected intravenously. Full-ring PET imaging was performed from the skull base to thigh after the  radiotracer. CT data was obtained and used for attenuation correction and anatomic localization. FASTING BLOOD GLUCOSE:  Value: 118 mg/dl COMPARISON:  Chest CT 09/14/2015. CT the abdomen and pelvis 12/27/2014. Neck CT 09/14/2015. FINDINGS: NECK No hypermetabolic lymph nodes in the neck. CHEST 2.8 x 2.8 cm left upper lobe nodule is hypermetabolic (SUVmax = 9.9), and makes contact with the overlying pleura. There is extensive hypermetabolic left hilar lymphadenopathy (SUVmax = 16 point to), which is contiguous with multiple adjacent nonenlarged and mildly enlarged but very hypermetabolic mediastinal lymph nodes throughout the prevascular, low left paratracheal and subcarinal nodal stations. Subcarinal lymph node measures 13 mm in short axis (SUVmax = 10.1). There is also nonenlarged but hypermetabolic right hilar lymphadenopathy (SUVmax = 8.3). Nonenlarged but hypermetabolic superior mediastinal and left supraclavicular lymph nodes are also noted (SUVmax = 6.0). 8 mm nodule in the anterior aspect of the right upper lobe (image 92 of series 4) is also a hypermetabolic (SUVmax = 4.4), presumably metastatic. Heart size is mildly enlarged with right atrial dilatation. There is no significant pericardial fluid, thickening or pericardial calcification. There is atherosclerosis of the thoracic aorta, the great vessels of the mediastinum and the coronary arteries, including calcified atherosclerotic plaque in the left main, left anterior descending, left circumflex and right coronary arteries. Status post median sternotomy for CABG, including LIMA to the LAD. Calcifications of the aortic valve. ABDOMEN/PELVIS Small focus of hypermetabolism (SUVmax = 6.6)in segment 7 of the liver without corresponding CT abnormality. 1.4 cm left adrenal nodule is hypermetabolic (SUVmax = 4.4). No abnormal hypermetabolic activity within the pancreas, right adrenal gland, or spleen. No hypermetabolic lymph nodes in the abdomen or pelvis.  SKELETON Multiple areas of hypermetabolism in the skeletal musculature, compatible with soft tissue metastasis, including a lesion in the right subscapularis muscle (SUVmax = 7.6), and a lesion in the left anterior abdominal wall musculature (SUVmax = 7.2). In addition, there are innumerable areas of hypermetabolism throughout the visualized axial and appendicular skeleton. The majority of these lesions are occult by CT  imaging. Some of these lesions include an expansile lesion with minimally displaced pathologic fracture (SUVmax = 8.7) in the lateral aspect of the right seventh rib (image 111 of series 4), a large diffusely infiltrative hypermetabolic (SUVmax = 19.1)YNWGNF in L1, and a large lesion in the left ilium (SUVmax = 17.9). Multiple other lesions are noted, including bilateral femoral neck lesions. IMPRESSION: 1. 2.8 cm hypermetabolic left upper lobe pulmonary nodule with bilateral hilar and mediastinal lymphadenopathy, metastatic lesions in right upper lobe of the lung, the segment 7 of the liver and in the left adrenal gland, multiple skeletal muscle metastasis, as well as metastatic lesions throughout all aspects of the visualized axial and appendicular skeleton, as discussed above, most likely to represent stage IV lung cancer. 2. Additional incidental findings, as above. Electronically Signed   By: Vinnie Langton M.D.   On: 09/19/2015 11:53   US Biopsy  09/25/2015  INDICATION: History of presumed metastatic lung cancer. Please perform ultrasound-guided biopsy of hypermetabolic subcutaneous nodule within the ventral mid lateral abdominal wall for tissue diagnostic purposes EXAM: ULTRASOUND GUIDED SUBCUTANEOUS NODULE BIOPSY COMPARISON:  PET-CT - 09/19/2015 MEDICATIONS: None ANESTHESIA/SEDATION: None COMPLICATIONS: None immediate PROCEDURE: Informed written consent was obtained from the patient after a discussion of the risks, benefits and alternatives to treatment. The patient understands and  consents the procedure. A timeout was performed prior to the initiation of the procedure. Ultrasound scanning was performed of the left mid lateral ventral abdominal wall demonstrates an approximately 1.1 x 1.1 by 1.5 cm mixed echogenic nodule within the subcutaneous tissues which correlates with the hypermetabolic nodule seen on preceding PET-CT (representative images 3 and 7). The procedure was planned. The skin overlying the ventral left mid lateral abdomen was prepped and draped in the usual sterile fashion. The overlying soft tissues were anesthetized with 1% lidocaine with epinephrine. This was followed by 6 core biopsies with an 18 gauge core device under direct ultrasound guidance. Multiple ultrasound images were saved for procedural documentation purposes. Superficial hemostasis was obtained with manual compression. Post procedural scanning was negative for definitive area of hemorrhage or additional complication. A dressing was placed. The patient tolerated the procedure well without immediate post procedural complication. IMPRESSION: Technically successful ultrasound guided core needle biopsy of indeterminate hypermetabolic nodule within the subcutaneous tissues about the left mid lateral ventral abdominal wall. Electronically Signed   By: Sandi Mariscal M.D.   On: 09/25/2015 16:05    ASSESSMENT: CT scan of the chest and PET scan has been reviewed independently and reviewed with the patient. Clinical suspicion is carcinoma of lung which is T2 N2 M1 disease based on PET scan. Biopsies consistent with poorly differentiated carcinoma   Patient had number of complaints.  Regarding pain control will start patient on fentanyl patch 12 g every 3 days and in 3 days if with the help of fentanyl patch and Vicodin for breakthrough pain if pain is not under better control fentanyl patch would be increased to 25 g  2, reguarding frequency of urine we will check urine analysis and urine culture if needed will  had   Antibiotics.  If there is no evidence of infection then possibility of  vesicare  Can be considered.  nausea is improved but appetite is still poor.  Will proceed with chemotherapy  MRI scan is pending  Because of patient's multiple complaint would be evaluated on day 8 with my associate before another round of Taxol chemotherapy can be given. PDL-1 is pending molecular markers are pending  Abdominal wall nodule is increased size All the side effects of chemotherapy including myelosuppression, alopecia, nausea vomiting fatigue weakness.  Secondary infection, and   peripheral neuropathy .  Has been discussed in details. Informal consent has been obtained and will be documented by nurses in the chart Intent of chemotherapy is palliation and relief in symptoms and extending survival    appears to be secondary to benign prostatic enlargement  PLAN:  No problem-specific assessment & plan notes found for this encounter.   Patient expressed understanding and was in agreement with this plan. He also understands that He can call clinic at any time with any questions, concerns, or complaints.    No matching staging information was found for the patient.  Forest Gleason, MD   10/08/2015 10:42 AM

## 2015-10-08 NOTE — Addendum Note (Signed)
Addended by: Telford Nab on: 10/08/2015 11:21 AM   Modules accepted: Orders

## 2015-10-09 ENCOUNTER — Other Ambulatory Visit: Payer: Self-pay | Admitting: Vascular Surgery

## 2015-10-10 ENCOUNTER — Ambulatory Visit: Payer: Medicare Other | Admitting: Cardiovascular Disease

## 2015-10-10 LAB — URINE CULTURE

## 2015-10-13 ENCOUNTER — Other Ambulatory Visit: Payer: Self-pay | Admitting: Cardiovascular Disease

## 2015-10-16 ENCOUNTER — Inpatient Hospital Stay: Payer: Medicare Other

## 2015-10-16 ENCOUNTER — Encounter: Payer: Self-pay | Admitting: *Deleted

## 2015-10-16 ENCOUNTER — Inpatient Hospital Stay (HOSPITAL_BASED_OUTPATIENT_CLINIC_OR_DEPARTMENT_OTHER): Payer: Medicare Other | Admitting: Internal Medicine

## 2015-10-16 ENCOUNTER — Inpatient Hospital Stay
Admission: AD | Admit: 2015-10-16 | Discharge: 2015-10-18 | DRG: 641 | Disposition: A | Discharge status: Home / Self Care | Payer: Medicare Other | Source: Ambulatory Visit | Attending: Internal Medicine | Admitting: Internal Medicine

## 2015-10-16 VITALS — BP 110/74 | HR 109 | Temp 97.4°F | Wt 188.5 lb

## 2015-10-16 DIAGNOSIS — Z87891 Personal history of nicotine dependence: Secondary | ICD-10-CM

## 2015-10-16 DIAGNOSIS — C7989 Secondary malignant neoplasm of other specified sites: Secondary | ICD-10-CM | POA: Diagnosis present

## 2015-10-16 DIAGNOSIS — N400 Enlarged prostate without lower urinary tract symptoms: Secondary | ICD-10-CM

## 2015-10-16 DIAGNOSIS — Z888 Allergy status to other drugs, medicaments and biological substances status: Secondary | ICD-10-CM | POA: Diagnosis not present

## 2015-10-16 DIAGNOSIS — Z809 Family history of malignant neoplasm, unspecified: Secondary | ICD-10-CM | POA: Diagnosis not present

## 2015-10-16 DIAGNOSIS — R531 Weakness: Secondary | ICD-10-CM | POA: Diagnosis not present

## 2015-10-16 DIAGNOSIS — R350 Frequency of micturition: Secondary | ICD-10-CM | POA: Diagnosis not present

## 2015-10-16 DIAGNOSIS — Z79899 Other long term (current) drug therapy: Secondary | ICD-10-CM

## 2015-10-16 DIAGNOSIS — J90 Pleural effusion, not elsewhere classified: Secondary | ICD-10-CM | POA: Diagnosis not present

## 2015-10-16 DIAGNOSIS — R112 Nausea with vomiting, unspecified: Secondary | ICD-10-CM | POA: Diagnosis present

## 2015-10-16 DIAGNOSIS — Z9889 Other specified postprocedural states: Secondary | ICD-10-CM

## 2015-10-16 DIAGNOSIS — R0789 Other chest pain: Secondary | ICD-10-CM

## 2015-10-16 DIAGNOSIS — R0602 Shortness of breath: Secondary | ICD-10-CM | POA: Diagnosis not present

## 2015-10-16 DIAGNOSIS — Z823 Family history of stroke: Secondary | ICD-10-CM

## 2015-10-16 DIAGNOSIS — C3410 Malignant neoplasm of upper lobe, unspecified bronchus or lung: Secondary | ICD-10-CM | POA: Diagnosis present

## 2015-10-16 DIAGNOSIS — C7951 Secondary malignant neoplasm of bone: Secondary | ICD-10-CM

## 2015-10-16 DIAGNOSIS — E43 Unspecified severe protein-calorie malnutrition: Secondary | ICD-10-CM | POA: Insufficient documentation

## 2015-10-16 DIAGNOSIS — T451X5A Adverse effect of antineoplastic and immunosuppressive drugs, initial encounter: Secondary | ICD-10-CM | POA: Diagnosis present

## 2015-10-16 DIAGNOSIS — F419 Anxiety disorder, unspecified: Secondary | ICD-10-CM

## 2015-10-16 DIAGNOSIS — R791 Abnormal coagulation profile: Secondary | ICD-10-CM | POA: Diagnosis present

## 2015-10-16 DIAGNOSIS — R918 Other nonspecific abnormal finding of lung field: Secondary | ICD-10-CM | POA: Diagnosis not present

## 2015-10-16 DIAGNOSIS — C3412 Malignant neoplasm of upper lobe, left bronchus or lung: Secondary | ICD-10-CM

## 2015-10-16 DIAGNOSIS — Z9221 Personal history of antineoplastic chemotherapy: Secondary | ICD-10-CM

## 2015-10-16 DIAGNOSIS — T451X5S Adverse effect of antineoplastic and immunosuppressive drugs, sequela: Secondary | ICD-10-CM

## 2015-10-16 DIAGNOSIS — I4891 Unspecified atrial fibrillation: Secondary | ICD-10-CM | POA: Diagnosis present

## 2015-10-16 DIAGNOSIS — Z882 Allergy status to sulfonamides status: Secondary | ICD-10-CM

## 2015-10-16 DIAGNOSIS — M8448XA Pathological fracture, other site, initial encounter for fracture: Secondary | ICD-10-CM | POA: Diagnosis present

## 2015-10-16 DIAGNOSIS — R05 Cough: Secondary | ICD-10-CM

## 2015-10-16 DIAGNOSIS — Z5111 Encounter for antineoplastic chemotherapy: Secondary | ICD-10-CM | POA: Diagnosis present

## 2015-10-16 DIAGNOSIS — R093 Abnormal sputum: Secondary | ICD-10-CM

## 2015-10-16 DIAGNOSIS — M549 Dorsalgia, unspecified: Secondary | ICD-10-CM | POA: Diagnosis not present

## 2015-10-16 DIAGNOSIS — C3490 Malignant neoplasm of unspecified part of unspecified bronchus or lung: Secondary | ICD-10-CM

## 2015-10-16 DIAGNOSIS — E860 Dehydration: Secondary | ICD-10-CM | POA: Diagnosis present

## 2015-10-16 DIAGNOSIS — Z8249 Family history of ischemic heart disease and other diseases of the circulatory system: Secondary | ICD-10-CM | POA: Diagnosis not present

## 2015-10-16 DIAGNOSIS — Z7901 Long term (current) use of anticoagulants: Secondary | ICD-10-CM

## 2015-10-16 DIAGNOSIS — R5383 Other fatigue: Secondary | ICD-10-CM | POA: Diagnosis not present

## 2015-10-16 DIAGNOSIS — Z7982 Long term (current) use of aspirin: Secondary | ICD-10-CM

## 2015-10-16 DIAGNOSIS — I10 Essential (primary) hypertension: Secondary | ICD-10-CM | POA: Diagnosis present

## 2015-10-16 DIAGNOSIS — R630 Anorexia: Secondary | ICD-10-CM | POA: Diagnosis not present

## 2015-10-16 DIAGNOSIS — M8448XS Pathological fracture, other site, sequela: Secondary | ICD-10-CM

## 2015-10-16 DIAGNOSIS — I2510 Atherosclerotic heart disease of native coronary artery without angina pectoris: Secondary | ICD-10-CM | POA: Diagnosis present

## 2015-10-16 DIAGNOSIS — E871 Hypo-osmolality and hyponatremia: Secondary | ICD-10-CM | POA: Diagnosis present

## 2015-10-16 DIAGNOSIS — J3801 Paralysis of vocal cords and larynx, unilateral: Secondary | ICD-10-CM | POA: Diagnosis not present

## 2015-10-16 DIAGNOSIS — Z951 Presence of aortocoronary bypass graft: Secondary | ICD-10-CM

## 2015-10-16 DIAGNOSIS — R490 Dysphonia: Secondary | ICD-10-CM | POA: Diagnosis not present

## 2015-10-16 LAB — CBC WITH DIFFERENTIAL/PLATELET
Basophils Absolute: 0 10*3/uL (ref 0–0.1)
Basophils Relative: 1 %
EOS ABS: 0.1 10*3/uL (ref 0–0.7)
Eosinophils Relative: 1 %
HEMATOCRIT: 48.4 % (ref 40.0–52.0)
HEMOGLOBIN: 16.3 g/dL (ref 13.0–18.0)
LYMPHS ABS: 0.6 10*3/uL — AB (ref 1.0–3.6)
LYMPHS PCT: 7 %
MCH: 27.5 pg (ref 26.0–34.0)
MCHC: 33.7 g/dL (ref 32.0–36.0)
MCV: 81.4 fL (ref 80.0–100.0)
Monocytes Absolute: 0.4 10*3/uL (ref 0.2–1.0)
Monocytes Relative: 4 %
NEUTROS ABS: 8.1 10*3/uL — AB (ref 1.4–6.5)
NEUTROS PCT: 87 %
PLATELETS: 257 10*3/uL (ref 150–440)
RBC: 5.94 MIL/uL — AB (ref 4.40–5.90)
RDW: 14.5 % (ref 11.5–14.5)
WBC: 9.2 10*3/uL (ref 3.8–10.6)

## 2015-10-16 LAB — BASIC METABOLIC PANEL
Anion gap: 10 (ref 5–15)
BUN: 51 mg/dL — AB (ref 6–20)
CALCIUM: 7.8 mg/dL — AB (ref 8.9–10.3)
CO2: 20 mmol/L — ABNORMAL LOW (ref 22–32)
CREATININE: 1.47 mg/dL — AB (ref 0.61–1.24)
Chloride: 97 mmol/L — ABNORMAL LOW (ref 101–111)
GFR calc Af Amer: 51 mL/min — ABNORMAL LOW (ref >60)
GFR, EST NON AFRICAN AMERICAN: 44 mL/min — AB (ref >60)
Glucose, Bld: 147 mg/dL — ABNORMAL HIGH (ref 65–99)
POTASSIUM: 4.6 mmol/L (ref 3.5–5.1)
SODIUM: 127 mmol/L — AB (ref 135–145)

## 2015-10-16 LAB — PROTIME-INR
INR: 3.41
Prothrombin Time: 33.7 seconds — ABNORMAL HIGH (ref 11.4–15.0)

## 2015-10-16 MED ORDER — SODIUM CHLORIDE 0.9 % IV SOLN
8.0000 mg | Freq: Three times a day (TID) | INTRAVENOUS | Status: DC
  Administered 2015-10-16 – 2015-10-17 (×3): 8 mg via INTRAVENOUS
  Filled 2015-10-16 (×5): qty 4

## 2015-10-16 MED ORDER — ALUM & MAG HYDROXIDE-SIMETH 200-200-20 MG/5ML PO SUSP: 60.0000 mL | ORAL | Status: DC | PRN

## 2015-10-16 MED ORDER — ENOXAPARIN SODIUM 40 MG/0.4ML ~~LOC~~ SOLN: 40.0000 mg | SUBCUTANEOUS | Status: DC

## 2015-10-16 MED ORDER — WARFARIN SODIUM 2 MG PO TABS: 5.0000 mg | ORAL_TABLET | Freq: Every day | ORAL | Status: DC

## 2015-10-16 MED ORDER — LISINOPRIL 10 MG PO TABS
10.0000 mg | ORAL_TABLET | Freq: Every day | ORAL | Status: DC
  Administered 2015-10-16 – 2015-10-18 (×2): 10 mg via ORAL
  Filled 2015-10-16 (×3): qty 1

## 2015-10-16 MED ORDER — METOPROLOL SUCCINATE ER 50 MG PO TB24
50.0000 mg | ORAL_TABLET | Freq: Every day | ORAL | Status: DC
Start: 2015-10-16 — End: 2015-10-18
  Administered 2015-10-16 – 2015-10-18 (×2): 50 mg via ORAL
  Filled 2015-10-16 (×3): qty 1

## 2015-10-16 MED ORDER — ASPIRIN EC 81 MG PO TBEC
81.0000 mg | DELAYED_RELEASE_TABLET | Freq: Every day | ORAL | Status: DC
  Administered 2015-10-16 – 2015-10-18 (×3): 81 mg via ORAL
  Filled 2015-10-16 (×3): qty 1

## 2015-10-16 MED ORDER — PROCHLORPERAZINE EDISYLATE 5 MG/ML IJ SOLN
10.0000 mg | Freq: Four times a day (QID) | INTRAMUSCULAR | Status: DC | PRN
  Filled 2015-10-16: qty 2

## 2015-10-16 MED ORDER — FENTANYL 12 MCG/HR TD PT72
12.5000 ug | MEDICATED_PATCH | TRANSDERMAL | Status: DC
  Administered 2015-10-16: 14:00:00 12.5 ug via TRANSDERMAL
  Filled 2015-10-16: qty 1

## 2015-10-16 MED ORDER — SODIUM CHLORIDE 0.9 % IV SOLN
INTRAVENOUS | Status: DC
Start: 2015-10-16 — End: 2015-10-16
  Administered 2015-10-16: 14:00:00 via INTRAVENOUS

## 2015-10-16 MED ORDER — SENNOSIDES-DOCUSATE SODIUM 8.6-50 MG PO TABS: 1.0000 | ORAL_TABLET | Freq: Every evening | ORAL | Status: DC | PRN

## 2015-10-16 MED ORDER — PANTOPRAZOLE SODIUM 40 MG PO TBEC
40.0000 mg | DELAYED_RELEASE_TABLET | Freq: Every day | ORAL | Status: DC
  Administered 2015-10-16 – 2015-10-18 (×3): 40 mg via ORAL
  Filled 2015-10-16 (×3): qty 1

## 2015-10-16 MED ORDER — HYDROCODONE-ACETAMINOPHEN 5-325 MG PO TABS
1.0000 | ORAL_TABLET | Freq: Four times a day (QID) | ORAL | Status: DC | PRN
Start: 2015-10-16 — End: 2015-10-18
  Administered 2015-10-18: 1 via ORAL
  Filled 2015-10-16: qty 1

## 2015-10-16 MED ORDER — SODIUM CHLORIDE 0.9 % IV SOLN
INTRAVENOUS | Status: AC
  Administered 2015-10-16 – 2015-10-18 (×4): via INTRAVENOUS

## 2015-10-16 MED ORDER — TAMSULOSIN HCL 0.4 MG PO CAPS
0.4000 mg | ORAL_CAPSULE | Freq: Every day | ORAL | Status: DC
  Administered 2015-10-16 – 2015-10-17 (×2): 0.4 mg via ORAL
  Filled 2015-10-16 (×2): qty 1

## 2015-10-16 NOTE — Plan of Care (Signed)
Problem: Education: Goal: Knowledge of [facility] Education information/materials will improve Outcome: Progressing Pt educated on using call bell and phone system.  Pt verbalized understanding. Pt given information guide.  Problem: Safety: Goal: Ability to remain free from injury will improve Outcome: Progressing Pt is high falls, bed alarm in use, Call bell and phone within reach, hourly safety rounds, pt calling when assistance is needed, pt remains free of injury this shift  Problem: Activity: Goal: Risk for activity intolerance will decrease Outcome: Progressing Pt has good mobility and is stand by assist  Problem: Fluid Volume: Goal: Ability to maintain a balanced intake and output will improve Outcome: Progressing VSS, afebrile, IVF infusing  Problem: Nutrition: Goal: Adequate nutrition will be maintained Outcome: Progressing Pt has tolerated clear liquid diet. No N/V since arriving at hospital

## 2015-10-16 NOTE — Progress Notes (Signed)
Patient requesting something for anxiety. States he is very anxious. Also has been nauseated since chemo treatment.  Patient says he cannot eat or drink and when he tries he throws it back up. Has not eaten since last Wednesday.  States his pain medication is not covering his breakthrough pain.  Further states he continues to have pain in right rib area and the bottoms of his feet have started to burn.  Wants to know if there is something that he can do to bind up his ribs to help the pain he is having.  Patient having problems with diarrhea.  Has been taking immodium.  Needs prescription for EMLA.

## 2015-10-16 NOTE — Progress Notes (Addendum)
2215  BP 88/52 called to Dr Grayland Ormond with new order to increase NS IV fluids to 125 cc/hr. Pt asymptomatic. Dorna Bloom RN

## 2015-10-16 NOTE — Progress Notes (Signed)
Pt was admitted to [facility] for dehydration, uncontrollable nausea and vomiting.  He has lung cancer and had first treatment of carbo and taxol and xgeva on 12/19.  At the time of the chemo visit last week he did have nausea but able to eat some.  After chemo his appetite decreased and vomiting even with zofran po that he was taking.  Gave report to Andee Poles, RN on oncology

## 2015-10-16 NOTE — H&P (Signed)
[place] @ [facility] [phone]  Fax:([phone]) [place]: [birth year]  MR#: [id number]  HDQ#:[id number]  Patient Care Team: Pcp Not In System as PCP - General Carloyn Manner, MD as Referring Physician (Otolaryngology)  CHIEF COMPLAINT: No chief complaint on file.  Uncontrolled nausea, vomiting, dehydration.  No history exists.    Oncology Flowsheet 05/05/2012 05/05/2012 12/26/2014 10/02/2015 10/08/2015  Day, Cycle - - - - Day 1, Cycle 1  CARBOplatin (PARAPLATIN) IV - - - - 330 mg  denosumab (XGEVA) [place] - - - - 120 mg  dexamethasone (DECADRON) IV - - - [ 10 mg ] [ 12 mg ]  fosaprepitant (EMEND) IV - - - - [ 150 mg ]  ondansetron (ZOFRAN) IV 4 mg 4 mg 4 mg [ 8 mg ] -  PACLitaxel (TAXOL) IV - - - - 100 mg/m2  palonosetron (ALOXI) IV - - - - 0.25 mg    HISTORY OF PRESENT ILLNESS:   [name] is a 80 year old gentleman with recently diagnosed metastatic poorly differentiated carcinoma likely of lung origin, with multiple metastatic lesions, including bones, chest wall, with pathologic fractures. He started chemotherapy with carboplatin given every 3 weeks and weekly paclitaxel one week ago, and tolerated that extremely poorly. According to him and his family he has not been able to eat and drink for the past 5 days, experiencing multiple episodes of nausea and vomiting, with poor relief from oral Zofran. He complains of right sided chest pain and tenderness to palpation, which he attributes to rib fractures. He also is very anxious, worrying about his current state. He claims that initially he had multiple diarrheal bowel movements, but has not had any bowel movements for the past 24 hours. He also complains of cough with greenish sputum. He denies fevers, chills, night sweats, bleeding from any source.  REVIEW OF SYSTEMS:   Review of Systems  All other systems reviewed and are negative.    PAST MEDICAL HISTORY: Past Medical History  Diagnosis Date  .  Hypertension   . Coronary artery disease   . Atrial fibrillation ([place]) 07/2015  . Primary cancer of left upper lobe of lung ([place]) 10/06/2015    PAST SURGICAL HISTORY: Past Surgical History  Procedure Laterality Date  . Coronary artery bypass graft  12/10/2005    LIMA to LAD,SVG to diagonal,SVG to ramus intermediate,SVG to distal RCA.  . Tonsillectomy    . Tee without cardioversion  05/10/2012    Procedure: TRANSESOPHAGEAL ECHOCARDIOGRAM (TEE);  Surgeon: [name], MD;  Location: [facility] ENDOSCOPY;  Service: Cardiovascular;  Laterality: N/A;  . Cardioversion  05/10/2012    Procedure: CARDIOVERSION;  Surgeon: [name], MD;  Location: [facility] ENDOSCOPY;  Service: Cardiovascular;  Laterality: N/A;  . Cardiac catheterization  10/28/2005    significant 3 vessel CAD  . Nm myocar perf wall motion  09/16/2005    evidence of diaphragmatic attenuation & subtle anterolateral ischemia.  . Cardioversion N/A 07/31/2015    Procedure: CARDIOVERSION;  Surgeon: Josue Hector, MD;  Location: [facility] ENDOSCOPY;  Service: Cardiovascular;  Laterality: N/A;    FAMILY HISTORY Family History  Problem Relation Age of Onset  . Stroke Mother 75  . Stroke Father 68  . Heart attack Father 62  . Heart attack Brother 40  . Cancer Sister 62    ADVANCED DIRECTIVES:  No flowsheet data found.  HEALTH MAINTENANCE: Social History  Substance Use Topics  . Smoking status: Former Smoker -- 1.00 packs/day  for 40 years    Types: Cigarettes    Quit date: 09/19/2005  . Smokeless tobacco: Never Used  . Alcohol Use: No     Allergies  Allergen Reactions  . Clonidine Derivatives Other (See Comments)    Pt was not aware of a reaction to this medication  . Diltiazem Hcl Er Other (See Comments)    High blood pressure  . Statins Other (See Comments)    Myalgias:  Previously on Crestor,Vytorin,Simvastatin,Lipitor & Pravastatin  . Sulfa Antibiotics Swelling    Current Facility-Administered Medications  Medication  Dose Route Frequency Provider Last Rate Last Dose  . 0.9 %  sodium chloride infusion   Intravenous Continuous Kerrick Miler, MD      . alum & mag hydroxide-simeth (MAALOX/MYLANTA) 200-200-20 MG/5ML suspension 60 mL  60 mL Oral Q4H PRN Adam Demary, MD      . aspirin EC tablet 81 mg  81 mg Oral Daily Alicea Wente, MD      . fentaNYL ([place] - dosed mcg/hr) 12.5 mcg  12.5 mcg Transdermal Q72H Diem Dicocco, MD      . HYDROcodone-acetaminophen (NORCO/VICODIN) 5-325 MG per tablet 1 tablet  1 tablet Oral Q6H PRN Havannah Streat, MD      . lisinopril (PRINIVIL,ZESTRIL) tablet 10 mg  10 mg Oral Daily Lothar Prehn, MD      . metoprolol succinate (TOPROL-XL) 24 hr tablet 50 mg  50 mg Oral Daily Amyriah Buras, MD      . ondansetron (ZOFRAN) 8 mg in sodium chloride 0.9 % 50 mL IVPB  8 mg Intravenous 3 times per day Keeyon Privitera, MD      . pantoprazole (PROTONIX) EC tablet 40 mg  40 mg Oral Daily Anwar Sakata, MD      . prochlorperazine (COMPAZINE) injection 10 mg  10 mg Intravenous Q6H PRN Airiana Elman, MD      . senna-docusate (Senokot-S) tablet 1 tablet  1 tablet Oral QHS PRN Theus Espin, MD      . tamsulosin (FLOMAX) capsule 0.4 mg  0.4 mg Oral QPC supper Charrisse Masley, MD      . warfarin (COUMADIN) tablet 5 mg  5 mg Oral q1800 Milanni Ayub, MD        OBJECTIVE:  There were no vitals filed for this visit.   Body mass index is 29.55 kg/(m^2).    ECOG FS:3 - Symptomatic, >50% confined to bed  Physical Exam  Constitutional: He is oriented to person, place, and time and well-developed, well-nourished, and in no distress. No distress.  Ill appearing elderly Caucasian male in mild respiratory distress.  HENT:  Head: Normocephalic and atraumatic.  Right Ear: External ear normal.  Left Ear: External ear normal.  Nose: Nose normal.  Mouth/Throat: Oropharynx is clear and moist. No oropharyngeal exudate.  Eyes: Conjunctivae and EOM are normal. Pupils are  equal, round, and reactive to light. Right eye exhibits no discharge. Left eye exhibits no discharge. No scleral icterus.  Neck: Normal range of motion. Neck supple. No JVD present. No tracheal deviation present. No thyromegaly present.  Cardiovascular: Normal rate, regular rhythm, normal heart sounds and intact distal pulses.  Exam reveals no gallop and no friction rub.   No murmur heard. Pulmonary/Chest: Effort normal and breath sounds normal. No stridor. No respiratory distress. He has no wheezes. He has no rales. He exhibits no tenderness.  Abdominal: Soft. Bowel sounds are normal. He exhibits no distension and no mass. There is no tenderness. There is no rebound and no  guarding.  Genitourinary:  Patient deferred  Musculoskeletal: Normal range of motion. He exhibits tenderness (Right lower axillary area). He exhibits no edema.  Lymphadenopathy:    He has no cervical adenopathy.  Neurological: He is alert and oriented to person, place, and time. He has normal reflexes. No cranial nerve deficit. He exhibits normal muscle tone. Gait normal. Coordination normal. GCS score is 15.  Skin: Skin is warm and dry. No rash noted. He is not diaphoretic. No erythema. No pallor.  Psychiatric: Mood, memory, affect and judgment normal.  Nursing note and vitals reviewed.    LAB RESULTS:  CBC Latest Ref Rng 10/16/2015 10/08/2015  WBC 3.8 - 10.6 K/uL 9.2 21.9(H)  Hemoglobin 13.0 - 18.0 g/dL 88.3 16.3  Hematocrit 61.9 - 52.0 % 48.4 49.2  Platelets 150 - 440 K/uL 257 289    Appointment on 10/16/2015  Component Date Value Ref Range Status  . WBC 10/16/2015 9.2  3.8 - 10.6 K/uL Final  . RBC 10/16/2015 5.94* 4.40 - 5.90 MIL/uL Final  . Hemoglobin 10/16/2015 16.3  13.0 - 18.0 g/dL Final  . HCT 54/73/5021 48.4  40.0 - 52.0 % Final  . MCV 10/16/2015 81.4  80.0 - 100.0 fL Final  . MCH 10/16/2015 27.5  26.0 - 34.0 pg Final  . MCHC 10/16/2015 33.7  32.0 - 36.0 g/dL Final  . RDW 92/03/9123 14.5  11.5 - 14.5  % Final  . Platelets 10/16/2015 257  150 - 440 K/uL Final  . Neutrophils Relative % 10/16/2015 87   Final  . Neutro Abs 10/16/2015 8.1* 1.4 - 6.5 K/uL Final  . Lymphocytes Relative 10/16/2015 7   Final  . Lymphs Abs 10/16/2015 0.6* 1.0 - 3.6 K/uL Final  . Monocytes Relative 10/16/2015 4   Final  . Monocytes Absolute 10/16/2015 0.4  0.2 - 1.0 K/uL Final  . Eosinophils Relative 10/16/2015 1   Final  . Eosinophils Absolute 10/16/2015 0.1  0 - 0.7 K/uL Final  . Basophils Relative 10/16/2015 1   Final  . Basophils Absolute 10/16/2015 0.0  0 - 0.1 K/uL Final  . Sodium 10/16/2015 127* 135 - 145 mmol/L Final  . Potassium 10/16/2015 4.6  3.5 - 5.1 mmol/L Final  . Chloride 10/16/2015 97* 101 - 111 mmol/L Final  . CO2 10/16/2015 20* 22 - 32 mmol/L Final  . Glucose, Bld 10/16/2015 147* 65 - 99 mg/dL Final  . BUN 61/70/0146 51* 6 - 20 mg/dL Final  . Creatinine, Ser 10/16/2015 1.47* 0.61 - 1.24 mg/dL Final  . Calcium 91/17/5541 7.8* 8.9 - 10.3 mg/dL Final  . GFR calc non Af Amer 10/16/2015 44* >60 mL/min Final  . GFR calc Af Amer 10/16/2015 51* >60 mL/min Final   Comment: (NOTE) The eGFR has been calculated using the CKD EPI equation. This calculation has not been validated in all clinical situations. eGFR's persistently <60 mL/min signify possible Chronic Kidney Disease.   . Anion gap 10/16/2015 10  5 - 15 Final       STUDIES: Nm Pet Image Initial (pi) Skull Base To Thigh  09/19/2015  CLINICAL DATA:  Initial treatment strategy for lung mass. EXAM: NUCLEAR MEDICINE PET SKULL BASE TO THIGH TECHNIQUE: 12.7 mCi F-18 FDG was injected intravenously. Full-ring PET imaging was performed from the skull base to thigh after the radiotracer. CT data was obtained and used for attenuation correction and anatomic localization. FASTING BLOOD GLUCOSE:  Value: 118 mg/dl COMPARISON:  Chest CT 91/64/1356. CT the abdomen and pelvis 12/27/2014. Neck CT 09/14/2015.  FINDINGS: NECK No hypermetabolic lymph nodes in  the neck. CHEST 2.8 x 2.8 cm left upper lobe nodule is hypermetabolic (SUVmax = 9.9), and makes contact with the overlying pleura. There is extensive hypermetabolic left hilar lymphadenopathy (SUVmax = 16 point to), which is contiguous with multiple adjacent nonenlarged and mildly enlarged but very hypermetabolic mediastinal lymph nodes throughout the prevascular, low left paratracheal and subcarinal nodal stations. Subcarinal lymph node measures 13 mm in short axis (SUVmax = 10.1). There is also nonenlarged but hypermetabolic right hilar lymphadenopathy (SUVmax = 8.3). Nonenlarged but hypermetabolic superior mediastinal and left supraclavicular lymph nodes are also noted (SUVmax = 6.0). 8 mm nodule in the anterior aspect of the right upper lobe (image 92 of series 4) is also a hypermetabolic (SUVmax = 4.4), presumably metastatic. Heart size is mildly enlarged with right atrial dilatation. There is no significant pericardial fluid, thickening or pericardial calcification. There is atherosclerosis of the thoracic aorta, the great vessels of the mediastinum and the coronary arteries, including calcified atherosclerotic plaque in the left main, left anterior descending, left circumflex and right coronary arteries. Status post median sternotomy for CABG, including LIMA to the LAD. Calcifications of the aortic valve. ABDOMEN/PELVIS Small focus of hypermetabolism (SUVmax = 6.6)in segment 7 of the liver without corresponding CT abnormality. 1.4 cm left adrenal nodule is hypermetabolic (SUVmax = 4.4). No abnormal hypermetabolic activity within the pancreas, right adrenal gland, or spleen. No hypermetabolic lymph nodes in the abdomen or pelvis. SKELETON Multiple areas of hypermetabolism in the skeletal musculature, compatible with soft tissue metastasis, including a lesion in the right subscapularis muscle (SUVmax = 7.6), and a lesion in the left anterior abdominal wall musculature (SUVmax = 7.2). In addition, there are  innumerable areas of hypermetabolism throughout the visualized axial and appendicular skeleton. The majority of these lesions are occult by CT imaging. Some of these lesions include an expansile lesion with minimally displaced pathologic fracture (SUVmax = 8.7) in the lateral aspect of the right seventh rib (image 111 of series 4), a large diffusely infiltrative hypermetabolic (SUVmax = 06.2)IRSWNI in L1, and a large lesion in the left ilium (SUVmax = 17.9). Multiple other lesions are noted, including bilateral femoral neck lesions. IMPRESSION: 1. 2.8 cm hypermetabolic left upper lobe pulmonary nodule with bilateral hilar and mediastinal lymphadenopathy, metastatic lesions in right upper lobe of the lung, the segment 7 of the liver and in the left adrenal gland, multiple skeletal muscle metastasis, as well as metastatic lesions throughout all aspects of the visualized axial and appendicular skeleton, as discussed above, most likely to represent stage IV lung cancer. 2. Additional incidental findings, as above. Electronically Signed   By: Vinnie Langton M.D.   On: 09/19/2015 11:53   US Biopsy  09/25/2015  INDICATION: History of presumed metastatic lung cancer. Please perform ultrasound-guided biopsy of hypermetabolic subcutaneous nodule within the ventral mid lateral abdominal wall for tissue diagnostic purposes EXAM: ULTRASOUND GUIDED SUBCUTANEOUS NODULE BIOPSY COMPARISON:  PET-CT - 09/19/2015 MEDICATIONS: None ANESTHESIA/SEDATION: None COMPLICATIONS: None immediate PROCEDURE: Informed written consent was obtained from the patient after a discussion of the risks, benefits and alternatives to treatment. The patient understands and consents the procedure. A timeout was performed prior to the initiation of the procedure. Ultrasound scanning was performed of the left mid lateral ventral abdominal wall demonstrates an approximately 1.1 x 1.1 by 1.5 cm mixed echogenic nodule within the subcutaneous tissues which  correlates with the hypermetabolic nodule seen on preceding PET-CT (representative images 3 and 7). The procedure was  planned. The skin overlying the ventral left mid lateral abdomen was prepped and draped in the usual sterile fashion. The overlying soft tissues were anesthetized with 1% lidocaine with epinephrine. This was followed by 6 core biopsies with an 18 gauge core device under direct ultrasound guidance. Multiple ultrasound images were saved for procedural documentation purposes. Superficial hemostasis was obtained with manual compression. Post procedural scanning was negative for definitive area of hemorrhage or additional complication. A dressing was placed. The patient tolerated the procedure well without immediate post procedural complication. IMPRESSION: Technically successful ultrasound guided core needle biopsy of indeterminate hypermetabolic nodule within the subcutaneous tissues about the left mid lateral ventral abdominal wall. Electronically Signed   By: Sandi Mariscal M.D.   On: 09/25/2015 16:05    ASSESSMENT AND MEDICAL DECISION MAKING:  #Uncontrolled nausea, vomiting, dehydration-secondary to chemotherapy, the patient is unable to maintain normal by mouth intake. In this situation it appears reasonable to admit [name] to the inpatient service, administer IV fluids, intravenous Zofran and Compazine, and insure that his symptoms are well controlled, and he is able to maintain oral hydration and nutrition. For the time being he will be placed on clear liquid diet with the plan to advance as tolerated.  #Metastatic poorly differentiated lung carcinoma-patient is scheduled for cycle 2 of carboplatin and paclitaxel, but chemotherapy will need to be delayed.  #Antral fibrillation with RVR-patient is currently on Coumadin, so we will check INR to decide on the appropriate dosage of Coumadin. He'll continue with 5 mg of Coumadin a day until INR is available. We will not place the patient on  another sort of DVT prophylaxis.  #DVT prophylaxis-currently on Coumadin.  #Anxiety-we will add a very low dose of Ativan 0.25 mg twice a day and assess the response over the next 24 hours.  #Bone pain-at this time appears to be reasonably well-controlled on a low dose fentanyl patch and Percocet for breakthrough pain. We will adjust treatment as needed.  #Advanced directives-full code.    Primary cancer of left upper lobe of lung ([place])   Staging form: Lung, AJCC 7th Edition     Clinical: Stage IV (T2a, N2, M1b) - Signed by Forest Gleason, MD on 10/06/2015   Roxana Hires, MD   10/16/2015 1:36 PM

## 2015-10-16 NOTE — Progress Notes (Deleted)
[place] @ [facility] Telephone:(336) [phone]  Fax:(336) [place]: 1935/02/13  MR#: [id number]  JYN#:[id number]  Patient Care Team: Pcp Not In System as PCP - General Carloyn Manner, MD as Referring Physician (Otolaryngology)  CHIEF COMPLAINT: Oncology history Abnormal CT scan of the chest (November, 2016) Irregular 3.2 x 2.5 left upper lobe lung mass.  Ipsilateral peribronchial and ipsilateral hilar lymphadenopathy.  Right upper lobe supper solid pulmonary nodule.  Multiple pulmonary nodules stress pleural effusion./.  2.  Biopsy of abdominal wall nodule was poorly differentiated   Carcinoma  . CK 7 positive,\TTF negative. Clinically consistent with lung primary  PET scan shows multiple bone metastases T2 N2 M1 B stage IV disease metastases to the bone and abdominal wall No chief complaint on file.   VISIT DIAGNOSIS:   No diagnosis found.    No history exists.    Oncology Flowsheet 05/05/2012 05/05/2012 12/26/2014 10/02/2015 10/08/2015  Day, Cycle - - - - Day 1, Cycle 1  CARBOplatin (PARAPLATIN) IV - - - - 330 mg  denosumab (XGEVA) [place] - - - - 120 mg  dexamethasone (DECADRON) IV - - - [ 10 mg ] [ 12 mg ]  fosaprepitant (EMEND) IV - - - - [ 150 mg ]  ondansetron (ZOFRAN) IV 4 mg 4 mg 4 mg [ 8 mg ] -  PACLitaxel (TAXOL) IV - - - - 100 mg/m2  palonosetron (ALOXI) IV - - - - 0.25 mg    INTERVAL HISTORY:  80 year old gentleman started having discomfort in the chest.  Patient went to emergency room in [facility] followed by [facility].  Multiple admission.  Patient has a history of atrial fibrillation underwent several times for defibrillation.  Without much success on chronic Coumadin therapy.  Patient continues to complain of dry hacking cough.  ,hoarseness of voice. Patient was evaluated by ENT surgeon  Dr. Pryor Ochoa and the left vocal cord paralysis was found. CT scan was ordered which revealed a lung mass and PET scan  revealed multiple bone metastases.  Patient was referred to me for further evaluation and treatment consideration  Biopsy of abdominal wall nodule was positive for poorly differentiated carcinoma CASE   was discussed in tumor conference.  Patient continues to a persistent nausea vomiting.   October 07, 2018  and is here to initiate chemotherapy continues to have increasing pain but not taking Vicodin round-the-clock.  Taking 4 tablets in 24 hours.  Has increased frequency of passing urine.  Not able to sleep in the night.  According to wife nausea is improved but appetite continues to be poor.  MRI scan of brain has been scheduled on  Wednesday across the street  Patient is here for ongoing evaluation and treatment consideration appears to be extremely apprehensive    REVIEW OF SYSTEMS:   Gen. status: Patient is feeling weak and tired.  not able to sleep because of frequency of passing urine already on Flomax HEENT: Hoarseness of voice. Lungs: Shortness of breath.  Dry hacking cough.  Patient has a squeezing pain in both chest wall area. GI: Poor appetite.  No nausea no vomiting no diarrhea. GU: No dysuria hematuria, as to get up every 2 hours to pass urine on Flomax Skin: No rash Neurological system no headache no dizziness.  No other focal symptoms Musculoskeletal system back pain. Patient is not taking medication round-the-clock for pain control. Lower extremity intermittent swelling Cardiac history of atrial fibrillation on chronic Coumadin therapy  Psychiatric system: No evidence of abnormality  As per HPI. Otherwise, a complete review of systems is negatve.  PAST MEDICAL HISTORY: Past Medical History  Diagnosis Date  . Hypertension   . Coronary artery disease   . Atrial fibrillation ([place]) 07/2015  . Primary cancer of left upper lobe of lung ([place]) 10/06/2015    PAST SURGICAL HISTORY: Past Surgical History  Procedure Laterality Date  . Coronary artery bypass graft   12/10/2005    LIMA to LAD,SVG to diagonal,SVG to ramus intermediate,SVG to distal RCA.  . Tonsillectomy    . Tee without cardioversion  05/10/2012    Procedure: TRANSESOPHAGEAL ECHOCARDIOGRAM (TEE);  Surgeon: [name], MD;  Location: [facility] ENDOSCOPY;  Service: Cardiovascular;  Laterality: N/A;  . Cardioversion  05/10/2012    Procedure: CARDIOVERSION;  Surgeon: [name], MD;  Location: [facility] ENDOSCOPY;  Service: Cardiovascular;  Laterality: N/A;  . Cardiac catheterization  10/28/2005    significant 3 vessel CAD  . Nm myocar perf wall motion  09/16/2005    evidence of diaphragmatic attenuation & subtle anterolateral ischemia.  . Cardioversion N/A 07/31/2015    Procedure: CARDIOVERSION;  Surgeon: Josue Hector, MD;  Location: [facility] ENDOSCOPY;  Service: Cardiovascular;  Laterality: N/A;    FAMILY HISTORY Family History  Problem Relation Age of Onset  . Stroke Mother 69  . Stroke Father 56  . Heart attack Father 68  . Heart attack Brother 25  . Cancer Sister 27       ADVANCED DIRECTIVES:  Patient does have advance healthcare directive, Patient   does not desire to make any changes  HEALTH MAINTENANCE: Social History  Substance Use Topics  . Smoking status: Former Smoker -- 1.00 packs/day for 40 years    Types: Cigarettes    Quit date: 09/19/2005  . Smokeless tobacco: Never Used  . Alcohol Use: No      Allergies  Allergen Reactions  . Clonidine Derivatives Other (See Comments)    Pt was not aware of a reaction to this medication  . Diltiazem Hcl Er Other (See Comments)    High blood pressure  . Statins Other (See Comments)    Myalgias:  Previously on Crestor,Vytorin,Simvastatin,Lipitor & Pravastatin  . Sulfa Antibiotics Swelling    Current Outpatient Prescriptions  Medication Sig Dispense Refill  . aspirin EC 81 MG tablet Take 81 mg by mouth daily.    . fentaNYL (DURAGESIC - DOSED MCG/HR) 12 MCG/HR Place 1 patch (12.5 mcg total) onto the skin every 3 (three) days.  10 patch 0  . HYDROcodone-acetaminophen (NORCO/VICODIN) 5-325 MG tablet Take 1 tablet by mouth every 6 (six) hours as needed for moderate pain. 60 tablet 0  . lisinopril (PRINIVIL,ZESTRIL) 10 MG tablet Take 1 tablet (10 mg total) by mouth daily. 90 tablet 3  . metoprolol succinate (TOPROL-XL) 50 MG 24 hr tablet Take 1 tablet (50 mg total) by mouth daily. 90 tablet 3  . omeprazole (PRILOSEC) 20 MG capsule Take 1 capsule (20 mg total) by mouth 2 (two) times daily before a meal. 60 capsule 3  . ondansetron (ZOFRAN) 4 MG tablet Take 1 tablet (4 mg total) by mouth every 8 (eight) hours as needed for nausea or vomiting. 30 tablet 0  . predniSONE (DELTASONE) 20 MG tablet Take 1 tablet (20 mg total) by mouth daily with breakfast. 30 tablet 3  . Tamsulosin HCl (FLOMAX) 0.4 MG CAPS Take 0.4 mg by mouth daily after supper.    . warfarin (COUMADIN) 5 MG tablet  Take 1 tablet by mouth daily or as directed by coumadin clinic 90 tablet 0   No current facility-administered medications for this visit.    OBJECTIVE: PHYSICAL EXAM: Gen. status: Patient is alert oriented not any acute distress Lymphatic system: Supraclavicular, cervical, axillary, inguinal lymph nodes are not palpable HEENT: No evidence of abnormality Lungs: Emphysematous chest diminished air entry on both sides. Cardiac: Irregular heart sounds GI: Abdomen is soft liver spleen not palpable there is palpable nodule in mid abdominal area 2 cm x 1 cm Lower extremity no edema Skin: No rash Neurological system: Higher functions within normal limit.  Cranial nodes are intact.  No other focal signs Psychiatric system within normal limit.    There were no vitals filed for this visit.   There is no weight on file to calculate BMI.    ECOG FS:1 - Symptomatic but completely ambulatory  LAB RESULTS:  Appointment on 10/16/2015  Component Date Value Ref Range Status  . WBC 10/16/2015 9.2  3.8 - 10.6 K/uL Final  . RBC 10/16/2015 5.94* 4.40 - 5.90  MIL/uL Final  . Hemoglobin 10/16/2015 16.3  13.0 - 18.0 g/dL Final  . HCT 10/16/2015 48.4  40.0 - 52.0 % Final  . MCV 10/16/2015 81.4  80.0 - 100.0 fL Final  . MCH 10/16/2015 27.5  26.0 - 34.0 pg Final  . MCHC 10/16/2015 33.7  32.0 - 36.0 g/dL Final  . RDW 10/16/2015 14.5  11.5 - 14.5 % Final  . Platelets 10/16/2015 257  150 - 440 K/uL Final  . Neutrophils Relative % 10/16/2015 87   Final  . Neutro Abs 10/16/2015 8.1* 1.4 - 6.5 K/uL Final  . Lymphocytes Relative 10/16/2015 7   Final  . Lymphs Abs 10/16/2015 0.6* 1.0 - 3.6 K/uL Final  . Monocytes Relative 10/16/2015 4   Final  . Monocytes Absolute 10/16/2015 0.4  0.2 - 1.0 K/uL Final  . Eosinophils Relative 10/16/2015 1   Final  . Eosinophils Absolute 10/16/2015 0.1  0 - 0.7 K/uL Final  . Basophils Relative 10/16/2015 1   Final  . Basophils Absolute 10/16/2015 0.0  0 - 0.1 K/uL Final  . Sodium 10/16/2015 127* 135 - 145 mmol/L Final  . Potassium 10/16/2015 4.6  3.5 - 5.1 mmol/L Final  . Chloride 10/16/2015 97* 101 - 111 mmol/L Final  . CO2 10/16/2015 20* 22 - 32 mmol/L Final  . Glucose, Bld 10/16/2015 147* 65 - 99 mg/dL Final  . BUN 10/16/2015 51* 6 - 20 mg/dL Final  . Creatinine, Ser 10/16/2015 1.47* 0.61 - 1.24 mg/dL Final  . Calcium 10/16/2015 7.8* 8.9 - 10.3 mg/dL Final  . GFR calc non Af Amer 10/16/2015 44* >60 mL/min Final  . GFR calc Af Amer 10/16/2015 51* >60 mL/min Final   Comment: (NOTE) The eGFR has been calculated using the CKD EPI equation. This calculation has not been validated in all clinical situations. eGFR's persistently <60 mL/min signify possible Chronic Kidney Disease.   . Anion gap 10/16/2015 10  5 - 15 Final     STUDIES: Nm Pet Image Initial (pi) Skull Base To Thigh  09/19/2015  CLINICAL DATA:  Initial treatment strategy for lung mass. EXAM: NUCLEAR MEDICINE PET SKULL BASE TO THIGH TECHNIQUE: 12.7 mCi F-18 FDG was injected intravenously. Full-ring PET imaging was performed from the skull base to thigh  after the radiotracer. CT data was obtained and used for attenuation correction and anatomic localization. FASTING BLOOD GLUCOSE:  Value: 118 mg/dl COMPARISON:  Chest CT 09/14/2015.  CT the abdomen and pelvis 12/27/2014. Neck CT 09/14/2015. FINDINGS: NECK No hypermetabolic lymph nodes in the neck. CHEST 2.8 x 2.8 cm left upper lobe nodule is hypermetabolic (SUVmax = 9.9), and makes contact with the overlying pleura. There is extensive hypermetabolic left hilar lymphadenopathy (SUVmax = 16 point to), which is contiguous with multiple adjacent nonenlarged and mildly enlarged but very hypermetabolic mediastinal lymph nodes throughout the prevascular, low left paratracheal and subcarinal nodal stations. Subcarinal lymph node measures 13 mm in short axis (SUVmax = 10.1). There is also nonenlarged but hypermetabolic right hilar lymphadenopathy (SUVmax = 8.3). Nonenlarged but hypermetabolic superior mediastinal and left supraclavicular lymph nodes are also noted (SUVmax = 6.0). 8 mm nodule in the anterior aspect of the right upper lobe (image 92 of series 4) is also a hypermetabolic (SUVmax = 4.4), presumably metastatic. Heart size is mildly enlarged with right atrial dilatation. There is no significant pericardial fluid, thickening or pericardial calcification. There is atherosclerosis of the thoracic aorta, the great vessels of the mediastinum and the coronary arteries, including calcified atherosclerotic plaque in the left main, left anterior descending, left circumflex and right coronary arteries. Status post median sternotomy for CABG, including LIMA to the LAD. Calcifications of the aortic valve. ABDOMEN/PELVIS Small focus of hypermetabolism (SUVmax = 6.6)in segment 7 of the liver without corresponding CT abnormality. 1.4 cm left adrenal nodule is hypermetabolic (SUVmax = 4.4). No abnormal hypermetabolic activity within the pancreas, right adrenal gland, or spleen. No hypermetabolic lymph nodes in the abdomen or  pelvis. SKELETON Multiple areas of hypermetabolism in the skeletal musculature, compatible with soft tissue metastasis, including a lesion in the right subscapularis muscle (SUVmax = 7.6), and a lesion in the left anterior abdominal wall musculature (SUVmax = 7.2). In addition, there are innumerable areas of hypermetabolism throughout the visualized axial and appendicular skeleton. The majority of these lesions are occult by CT imaging. Some of these lesions include an expansile lesion with minimally displaced pathologic fracture (SUVmax = 8.7) in the lateral aspect of the right seventh rib (image 111 of series 4), a large diffusely infiltrative hypermetabolic (SUVmax = 16.1)WRUEAV in L1, and a large lesion in the left ilium (SUVmax = 17.9). Multiple other lesions are noted, including bilateral femoral neck lesions. IMPRESSION: 1. 2.8 cm hypermetabolic left upper lobe pulmonary nodule with bilateral hilar and mediastinal lymphadenopathy, metastatic lesions in right upper lobe of the lung, the segment 7 of the liver and in the left adrenal gland, multiple skeletal muscle metastasis, as well as metastatic lesions throughout all aspects of the visualized axial and appendicular skeleton, as discussed above, most likely to represent stage IV lung cancer. 2. Additional incidental findings, as above. Electronically Signed   By: Vinnie Langton M.D.   On: 09/19/2015 11:53   US Biopsy  09/25/2015  INDICATION: History of presumed metastatic lung cancer. Please perform ultrasound-guided biopsy of hypermetabolic subcutaneous nodule within the ventral mid lateral abdominal wall for tissue diagnostic purposes EXAM: ULTRASOUND GUIDED SUBCUTANEOUS NODULE BIOPSY COMPARISON:  PET-CT - 09/19/2015 MEDICATIONS: None ANESTHESIA/SEDATION: None COMPLICATIONS: None immediate PROCEDURE: Informed written consent was obtained from the patient after a discussion of the risks, benefits and alternatives to treatment. The patient understands  and consents the procedure. A timeout was performed prior to the initiation of the procedure. Ultrasound scanning was performed of the left mid lateral ventral abdominal wall demonstrates an approximately 1.1 x 1.1 by 1.5 cm mixed echogenic nodule within the subcutaneous tissues which correlates with the hypermetabolic nodule seen on preceding PET-CT (  representative images 3 and 7). The procedure was planned. The skin overlying the ventral left mid lateral abdomen was prepped and draped in the usual sterile fashion. The overlying soft tissues were anesthetized with 1% lidocaine with epinephrine. This was followed by 6 core biopsies with an 18 gauge core device under direct ultrasound guidance. Multiple ultrasound images were saved for procedural documentation purposes. Superficial hemostasis was obtained with manual compression. Post procedural scanning was negative for definitive area of hemorrhage or additional complication. A dressing was placed. The patient tolerated the procedure well without immediate post procedural complication. IMPRESSION: Technically successful ultrasound guided core needle biopsy of indeterminate hypermetabolic nodule within the subcutaneous tissues about the left mid lateral ventral abdominal wall. Electronically Signed   By: Sandi Mariscal M.D.   On: 09/25/2015 16:05    ASSESSMENT: CT scan of the chest and PET scan has been reviewed independently and reviewed with the patient. Clinical suspicion is carcinoma of lung which is T2 N2 M1 disease based on PET scan. Biopsies consistent with poorly differentiated carcinoma   Patient had number of complaints.  Regarding pain control will start patient on fentanyl patch 12 g every 3 days and in 3 days if with the help of fentanyl patch and Vicodin for breakthrough pain if pain is not under better control fentanyl patch would be increased to 25 g  2, reguarding frequency of urine we will check urine analysis and urine culture if needed  will had   Antibiotics.  If there is no evidence of infection then possibility of  vesicare  Can be considered.  nausea is improved but appetite is still poor.  Will proceed with chemotherapy  MRI scan is pending  Because of patient's multiple complaint would be evaluated on day 8 with my associate before another round of Taxol chemotherapy can be given. PDL-1 is pending molecular markers are pending  Abdominal wall nodule is increased size All the side effects of chemotherapy including myelosuppression, alopecia, nausea vomiting fatigue weakness.  Secondary infection, and   peripheral neuropathy .  Has been discussed in details. Informal consent has been obtained and will be documented by nurses in the chart Intent of chemotherapy is palliation and relief in symptoms and extending survival    appears to be secondary to benign prostatic enlargement  PLAN:  No problem-specific assessment & plan notes found for this encounter.   Patient expressed understanding and was in agreement with this plan. He also understands that He can call clinic at any time with any questions, concerns, or complaints.    No matching staging information was found for the patient.  Roxana Hires, MD   10/16/2015 9:51 AM

## 2015-10-16 NOTE — Progress Notes (Signed)
Patient is to be admitted for management of uncontrollable nausea, vomiting, related to recent chemotherapy. No charge for this visit. Roxana Hires, MD 10/16/2015 1:35 PM

## 2015-10-17 ENCOUNTER — Other Ambulatory Visit: Payer: Self-pay | Admitting: *Deleted

## 2015-10-17 DIAGNOSIS — E43 Unspecified severe protein-calorie malnutrition: Secondary | ICD-10-CM | POA: Insufficient documentation

## 2015-10-17 DIAGNOSIS — Z7901 Long term (current) use of anticoagulants: Secondary | ICD-10-CM

## 2015-10-17 DIAGNOSIS — E860 Dehydration: Principal | ICD-10-CM

## 2015-10-17 LAB — COMPREHENSIVE METABOLIC PANEL
ALBUMIN: 2.7 g/dL — AB (ref 3.5–5.0)
ALK PHOS: 63 U/L (ref 38–126)
ALT: 40 U/L (ref 17–63)
ANION GAP: 5 (ref 5–15)
AST: 18 U/L (ref 15–41)
BUN: 44 mg/dL — AB (ref 6–20)
CALCIUM: 7.5 mg/dL — AB (ref 8.9–10.3)
CO2: 25 mmol/L (ref 22–32)
Chloride: 105 mmol/L (ref 101–111)
Creatinine, Ser: 1.45 mg/dL — ABNORMAL HIGH (ref 0.61–1.24)
GFR calc Af Amer: 52 mL/min — ABNORMAL LOW (ref >60)
GFR calc non Af Amer: 45 mL/min — ABNORMAL LOW (ref >60)
GLUCOSE: 97 mg/dL (ref 65–99)
Potassium: 5 mmol/L (ref 3.5–5.1)
SODIUM: 135 mmol/L (ref 135–145)
Total Bilirubin: 0.9 mg/dL (ref 0.3–1.2)
Total Protein: 5.6 g/dL — ABNORMAL LOW (ref 6.5–8.1)

## 2015-10-17 LAB — CBC
HCT: 40.7 % (ref 40.0–52.0)
HEMOGLOBIN: 13.8 g/dL (ref 13.0–18.0)
MCH: 27.6 pg (ref 26.0–34.0)
MCHC: 33.8 g/dL (ref 32.0–36.0)
MCV: 81.7 fL (ref 80.0–100.0)
Platelets: 198 10*3/uL (ref 150–440)
RBC: 4.98 MIL/uL (ref 4.40–5.90)
RDW: 14.5 % (ref 11.5–14.5)
WBC: 6.5 10*3/uL (ref 3.8–10.6)

## 2015-10-17 LAB — PROTIME-INR
INR: 3.65
Prothrombin Time: 35.5 seconds — ABNORMAL HIGH (ref 11.4–15.0)

## 2015-10-17 LAB — MAGNESIUM: Magnesium: 2.4 mg/dL (ref 1.7–2.4)

## 2015-10-17 MED ORDER — ONDANSETRON HCL 4 MG PO TABS: 8.0000 mg | ORAL_TABLET | Freq: Three times a day (TID) | ORAL | Status: DC | PRN

## 2015-10-17 MED ORDER — PROCHLORPERAZINE MALEATE 10 MG PO TABS: 10.0000 mg | ORAL_TABLET | Freq: Four times a day (QID) | ORAL | Status: DC | PRN

## 2015-10-17 MED ORDER — WARFARIN SODIUM 5 MG PO TABS: ORAL_TABLET | ORAL | Status: DC

## 2015-10-17 NOTE — Progress Notes (Signed)
[place] @ [facility] [phone]  Fax:([phone]) [place]: 1934/12/11  MR#: [id number]  YIF#:[id number]  Patient Care Team: Pcp Not In System as PCP - General Carloyn Manner, MD as Referring Physician (Otolaryngology)  CHIEF COMPLAINT: No chief complaint on file.    No history exists.    Oncology Flowsheet 05/05/2012 12/26/2014 10/02/2015 10/08/2015 10/16/2015 10/16/2015 10/17/2015  Day, Cycle - - - Day 1, Cycle 1 - - -  CARBOplatin (PARAPLATIN) IV - - - 330 mg - - -  denosumab (XGEVA) [place] - - - 120 mg - - -  dexamethasone (DECADRON) IV - - [ 10 mg ] [ 12 mg ] - - -  fosaprepitant (EMEND) IV - - - [ 150 mg ] - - -  ondansetron (ZOFRAN) IV 4 mg 4 mg [ 8 mg ] - 8 mg 8 mg 8 mg  PACLitaxel (TAXOL) IV - - - 100 mg/m2 - - -  palonosetron (ALOXI) IV - - - 0.25 mg - - -    HISTORY OF PRESENT ILLNESS:   Mr. Tullier is a 80 year old gentleman with recently diagnosed metastatic poorly differentiated carcinoma likely of lung origin, with multiple metastatic lesions, including bones, chest wall, with pathologic fractures. He started chemotherapy with carboplatin given every 3 weeks and weekly paclitaxel one week ago, and tolerated that extremely poorly. She came to our clinic yesterday, and according to him and his family he had not been able to eat or drink for the past 5 days, experiencing multiple episodes of nausea and vomiting, with poor relief from oral Zofran. He complained of right sided chest pain and tenderness to palpation, which he attributes to rib fractures. He also is very anxious, worrying about his current state. He claims that initially he had multiple diarrheal bowel movements, but has not had any bowel movements for the past 24 hours. He also complains of cough with greenish sputum. He denies fevers, chills, night sweats, bleeding from any source.  Current status: Over the past 24 hours Mr. Route has improved significantly. He has not had any  nausea, she has been able to tolerate clear liquid diet. He denies any diarrhea. His pain is decently controlled, but he continues to have tenderness in the right side of the chest.   REVIEW OF SYSTEMS:   ROS   PAST MEDICAL HISTORY: Past Medical History  Diagnosis Date  . Hypertension   . Coronary artery disease   . Atrial fibrillation ([place]) 07/2015  . Primary cancer of left upper lobe of lung ([place]) 10/06/2015    PAST SURGICAL HISTORY: Past Surgical History  Procedure Laterality Date  . Coronary artery bypass graft  12/10/2005    LIMA to LAD,SVG to diagonal,SVG to ramus intermediate,SVG to distal RCA.  . Tonsillectomy    . Tee without cardioversion  05/10/2012    Procedure: TRANSESOPHAGEAL ECHOCARDIOGRAM (TEE);  Surgeon: [name], MD;  Location: [facility] ENDOSCOPY;  Service: Cardiovascular;  Laterality: N/A;  . Cardioversion  05/10/2012    Procedure: CARDIOVERSION;  Surgeon: [name], MD;  Location: [facility] ENDOSCOPY;  Service: Cardiovascular;  Laterality: N/A;  . Cardiac catheterization  10/28/2005    significant 3 vessel CAD  . Nm myocar perf wall motion  09/16/2005    evidence of diaphragmatic attenuation & subtle anterolateral ischemia.  . Cardioversion N/A 07/31/2015    Procedure: CARDIOVERSION;  Surgeon: Josue Hector, MD;  Location: [facility] ENDOSCOPY;  Service: Cardiovascular;  Laterality: N/A;    FAMILY  HISTORY Family History  Problem Relation Age of Onset  . Stroke Mother 79  . Stroke Father 82  . Heart attack Father 43  . Heart attack Brother 59  . Cancer Sister 83    ADVANCED DIRECTIVES:  No flowsheet data found.  HEALTH MAINTENANCE: Social History  Substance Use Topics  . Smoking status: Former Smoker -- 1.00 packs/day for 40 years    Types: Cigarettes    Quit date: 09/19/2005  . Smokeless tobacco: Never Used  . Alcohol Use: No     Allergies  Allergen Reactions  . Clonidine Derivatives Other (See Comments)    Pt was not aware of a reaction to this  medication  . Diltiazem Hcl Er Other (See Comments)    High blood pressure  . Statins Other (See Comments)    Myalgias:  Previously on Crestor,Vytorin,Simvastatin,Lipitor & Pravastatin  . Sulfa Antibiotics Swelling    Current Facility-Administered Medications  Medication Dose Route Frequency Provider Last Rate Last Dose  . 0.9 %  sodium chloride infusion   Intravenous Continuous Lloyd Huger, MD 125 mL/hr at 10/17/15 0719    . alum & mag hydroxide-simeth (MAALOX/MYLANTA) 200-200-20 MG/5ML suspension 60 mL  60 mL Oral Q4H PRN Sherrita Riederer, MD      . aspirin EC tablet 81 mg  81 mg Oral Daily Weldon Nouri, MD   81 mg at 10/16/15 1354  . fentaNYL (DURAGESIC - dosed mcg/hr) 12.5 mcg  12.5 mcg Transdermal Q72H Trevone Prestwood, MD   12.5 mcg at 10/16/15 1357  . HYDROcodone-acetaminophen (NORCO/VICODIN) 5-325 MG per tablet 1 tablet  1 tablet Oral Q6H PRN Dvon Jiles, MD      . lisinopril (PRINIVIL,ZESTRIL) tablet 10 mg  10 mg Oral Daily Anahla Bevis, MD   10 mg at 10/16/15 1354  . metoprolol succinate (TOPROL-XL) 24 hr tablet 50 mg  50 mg Oral Daily Dianara Smullen, MD   50 mg at 10/16/15 1354  . ondansetron (ZOFRAN) 8 mg in sodium chloride 0.9 % 50 mL IVPB  8 mg Intravenous 3 times per day Roxana Hires, MD   8 mg at 10/17/15 0608  . pantoprazole (PROTONIX) EC tablet 40 mg  40 mg Oral Daily Roxana Hires, MD   40 mg at 10/16/15 1354  . prochlorperazine (COMPAZINE) injection 10 mg  10 mg Intravenous Q6H PRN Ariel Dimitri, MD      . senna-docusate (Senokot-S) tablet 1 tablet  1 tablet Oral QHS PRN Simi Briel, MD      . tamsulosin (FLOMAX) capsule 0.4 mg  0.4 mg Oral QPC supper Daeron Carreno, MD   0.4 mg at 10/16/15 1817    OBJECTIVE:  Filed Vitals:   10/16/15 2152 10/17/15 0523  BP: 88/52 91/48  Pulse: 82 81  Temp: 97.8 F (36.6 C) 97.9 F (36.6 C)  Resp: 18 18     Body mass index is 29.55 kg/(m^2).    ECOG FS:2 - Symptomatic, <50% confined to  bed  Physical Exam  Constitutional: He is oriented to person, place, and time.  Elderly Caucasian male in no significant distress  HENT:  Head: Normocephalic and atraumatic.  Right Ear: External ear normal.  Left Ear: External ear normal.  Nose: Nose normal.  Mouth/Throat: Oropharynx is clear and moist. No oropharyngeal exudate.  Eyes: Conjunctivae and EOM are normal. Pupils are equal, round, and reactive to light. Right eye exhibits no discharge. Left eye exhibits no discharge. No scleral icterus.  Neck: Normal range of motion. Neck supple. No JVD  present. No tracheal deviation present. No thyromegaly present.  Cardiovascular: Normal rate, regular rhythm, normal heart sounds and intact distal pulses.  Exam reveals no gallop and no friction rub.   No murmur heard. Pulmonary/Chest: Effort normal and breath sounds normal. No stridor. No respiratory distress. He has no wheezes. He has no rales. He exhibits no tenderness.  Abdominal: Soft. Bowel sounds are normal. He exhibits no distension and no mass. There is no tenderness. There is no rebound and no guarding.  Genitourinary:  Patient deferred  Musculoskeletal: Normal range of motion. He exhibits no edema or tenderness.  Lymphadenopathy:    He has no cervical adenopathy.  Neurological: He is alert and oriented to person, place, and time. He has normal reflexes. No cranial nerve deficit. He exhibits normal muscle tone. Gait normal. Coordination normal. GCS score is 15.  Skin: Skin is warm and dry. No rash noted. He is not diaphoretic. No erythema. No pallor.  Psychiatric: Mood, memory, affect and judgment normal.  Vitals reviewed.    LAB RESULTS:  CBC Latest Ref Rng 10/17/2015 10/16/2015  WBC 3.8 - 10.6 K/uL 6.5 9.2  Hemoglobin 13.0 - 18.0 g/dL 13.8 16.3  Hematocrit 40.0 - 52.0 % 40.7 48.4  Platelets 150 - 440 K/uL 198 257    Admission on 10/16/2015  Component Date Value Ref Range Status  . Prothrombin Time 10/16/2015 33.7* 11.4  - 15.0 seconds Final  . INR 10/16/2015 3.41   Final  . WBC 10/17/2015 6.5  3.8 - 10.6 K/uL Final  . RBC 10/17/2015 4.98  4.40 - 5.90 MIL/uL Final  . Hemoglobin 10/17/2015 13.8  13.0 - 18.0 g/dL Final  . HCT 10/17/2015 40.7  40.0 - 52.0 % Final  . MCV 10/17/2015 81.7  80.0 - 100.0 fL Final  . MCH 10/17/2015 27.6  26.0 - 34.0 pg Final  . MCHC 10/17/2015 33.8  32.0 - 36.0 g/dL Final  . RDW 10/17/2015 14.5  11.5 - 14.5 % Final  . Platelets 10/17/2015 198  150 - 440 K/uL Final  . Sodium 10/17/2015 135  135 - 145 mmol/L Final  . Potassium 10/17/2015 5.0  3.5 - 5.1 mmol/L Final  . Chloride 10/17/2015 105  101 - 111 mmol/L Final  . CO2 10/17/2015 25  22 - 32 mmol/L Final  . Glucose, Bld 10/17/2015 97  65 - 99 mg/dL Final  . BUN 10/17/2015 44* 6 - 20 mg/dL Final  . Creatinine, Ser 10/17/2015 1.45* 0.61 - 1.24 mg/dL Final  . Calcium 10/17/2015 7.5* 8.9 - 10.3 mg/dL Final  . Total Protein 10/17/2015 5.6* 6.5 - 8.1 g/dL Final  . Albumin 10/17/2015 2.7* 3.5 - 5.0 g/dL Final  . AST 10/17/2015 18  15 - 41 U/L Final  . ALT 10/17/2015 40  17 - 63 U/L Final  . Alkaline Phosphatase 10/17/2015 63  38 - 126 U/L Final  . Total Bilirubin 10/17/2015 0.9  0.3 - 1.2 mg/dL Final  . GFR calc non Af Amer 10/17/2015 45* >60 mL/min Final  . GFR calc Af Amer 10/17/2015 52* >60 mL/min Final   Comment: (NOTE) The eGFR has been calculated using the CKD EPI equation. This calculation has not been validated in all clinical situations. eGFR's persistently <60 mL/min signify possible Chronic Kidney Disease.   . Anion gap 10/17/2015 5  5 - 15 Final  . Magnesium 10/17/2015 2.4  1.7 - 2.4 mg/dL Final  . Prothrombin Time 10/17/2015 35.5* 11.4 - 15.0 seconds Final  . INR 10/17/2015 3.65  Final  Appointment on 10/16/2015  Component Date Value Ref Range Status  . WBC 10/16/2015 9.2  3.8 - 10.6 K/uL Final  . RBC 10/16/2015 5.94* 4.40 - 5.90 MIL/uL Final  . Hemoglobin 10/16/2015 16.3  13.0 - 18.0 g/dL Final  . HCT  10/16/2015 48.4  40.0 - 52.0 % Final  . MCV 10/16/2015 81.4  80.0 - 100.0 fL Final  . MCH 10/16/2015 27.5  26.0 - 34.0 pg Final  . MCHC 10/16/2015 33.7  32.0 - 36.0 g/dL Final  . RDW 10/16/2015 14.5  11.5 - 14.5 % Final  . Platelets 10/16/2015 257  150 - 440 K/uL Final  . Neutrophils Relative % 10/16/2015 87   Final  . Neutro Abs 10/16/2015 8.1* 1.4 - 6.5 K/uL Final  . Lymphocytes Relative 10/16/2015 7   Final  . Lymphs Abs 10/16/2015 0.6* 1.0 - 3.6 K/uL Final  . Monocytes Relative 10/16/2015 4   Final  . Monocytes Absolute 10/16/2015 0.4  0.2 - 1.0 K/uL Final  . Eosinophils Relative 10/16/2015 1   Final  . Eosinophils Absolute 10/16/2015 0.1  0 - 0.7 K/uL Final  . Basophils Relative 10/16/2015 1   Final  . Basophils Absolute 10/16/2015 0.0  0 - 0.1 K/uL Final  . Sodium 10/16/2015 127* 135 - 145 mmol/L Final  . Potassium 10/16/2015 4.6  3.5 - 5.1 mmol/L Final  . Chloride 10/16/2015 97* 101 - 111 mmol/L Final  . CO2 10/16/2015 20* 22 - 32 mmol/L Final  . Glucose, Bld 10/16/2015 147* 65 - 99 mg/dL Final  . BUN 10/16/2015 51* 6 - 20 mg/dL Final  . Creatinine, Ser 10/16/2015 1.47* 0.61 - 1.24 mg/dL Final  . Calcium 10/16/2015 7.8* 8.9 - 10.3 mg/dL Final  . GFR calc non Af Amer 10/16/2015 44* >60 mL/min Final  . GFR calc Af Amer 10/16/2015 51* >60 mL/min Final   Comment: (NOTE) The eGFR has been calculated using the CKD EPI equation. This calculation has not been validated in all clinical situations. eGFR's persistently <60 mL/min signify possible Chronic Kidney Disease.   . Anion gap 10/16/2015 10  5 - 15 Final       STUDIES: Nm Pet Image Initial (pi) Skull Base To Thigh  09/19/2015  CLINICAL DATA:  Initial treatment strategy for lung mass. EXAM: NUCLEAR MEDICINE PET SKULL BASE TO THIGH TECHNIQUE: 12.7 mCi F-18 FDG was injected intravenously. Full-ring PET imaging was performed from the skull base to thigh after the radiotracer. CT data was obtained and used for attenuation  correction and anatomic localization. FASTING BLOOD GLUCOSE:  Value: 118 mg/dl COMPARISON:  Chest CT 09/14/2015. CT the abdomen and pelvis 12/27/2014. Neck CT 09/14/2015. FINDINGS: NECK No hypermetabolic lymph nodes in the neck. CHEST 2.8 x 2.8 cm left upper lobe nodule is hypermetabolic (SUVmax = 9.9), and makes contact with the overlying pleura. There is extensive hypermetabolic left hilar lymphadenopathy (SUVmax = 16 point to), which is contiguous with multiple adjacent nonenlarged and mildly enlarged but very hypermetabolic mediastinal lymph nodes throughout the prevascular, low left paratracheal and subcarinal nodal stations. Subcarinal lymph node measures 13 mm in short axis (SUVmax = 10.1). There is also nonenlarged but hypermetabolic right hilar lymphadenopathy (SUVmax = 8.3). Nonenlarged but hypermetabolic superior mediastinal and left supraclavicular lymph nodes are also noted (SUVmax = 6.0). 8 mm nodule in the anterior aspect of the right upper lobe (image 92 of series 4) is also a hypermetabolic (SUVmax = 4.4), presumably metastatic. Heart size is mildly enlarged with right atrial  dilatation. There is no significant pericardial fluid, thickening or pericardial calcification. There is atherosclerosis of the thoracic aorta, the great vessels of the mediastinum and the coronary arteries, including calcified atherosclerotic plaque in the left main, left anterior descending, left circumflex and right coronary arteries. Status post median sternotomy for CABG, including LIMA to the LAD. Calcifications of the aortic valve. ABDOMEN/PELVIS Small focus of hypermetabolism (SUVmax = 6.6)in segment 7 of the liver without corresponding CT abnormality. 1.4 cm left adrenal nodule is hypermetabolic (SUVmax = 4.4). No abnormal hypermetabolic activity within the pancreas, right adrenal gland, or spleen. No hypermetabolic lymph nodes in the abdomen or pelvis. SKELETON Multiple areas of hypermetabolism in the skeletal  musculature, compatible with soft tissue metastasis, including a lesion in the right subscapularis muscle (SUVmax = 7.6), and a lesion in the left anterior abdominal wall musculature (SUVmax = 7.2). In addition, there are innumerable areas of hypermetabolism throughout the visualized axial and appendicular skeleton. The majority of these lesions are occult by CT imaging. Some of these lesions include an expansile lesion with minimally displaced pathologic fracture (SUVmax = 8.7) in the lateral aspect of the right seventh rib (image 111 of series 4), a large diffusely infiltrative hypermetabolic (SUVmax = 15.2)lesion in L1, and a large lesion in the left ilium (SUVmax = 17.9). Multiple other lesions are noted, including bilateral femoral neck lesions. IMPRESSION: 1. 2.8 cm hypermetabolic left upper lobe pulmonary nodule with bilateral hilar and mediastinal lymphadenopathy, metastatic lesions in right upper lobe of the lung, the segment 7 of the liver and in the left adrenal gland, multiple skeletal muscle metastasis, as well as metastatic lesions throughout all aspects of the visualized axial and appendicular skeleton, as discussed above, most likely to represent stage IV lung cancer. 2. Additional incidental findings, as above. Electronically Signed   By: Trudie Reed M.D.   On: 09/19/2015 11:53   US Biopsy  09/25/2015  INDICATION: History of presumed metastatic lung cancer. Please perform ultrasound-guided biopsy of hypermetabolic subcutaneous nodule within the ventral mid lateral abdominal wall for tissue diagnostic purposes EXAM: ULTRASOUND GUIDED SUBCUTANEOUS NODULE BIOPSY COMPARISON:  PET-CT - 09/19/2015 MEDICATIONS: None ANESTHESIA/SEDATION: None COMPLICATIONS: None immediate PROCEDURE: Informed written consent was obtained from the patient after a discussion of the risks, benefits and alternatives to treatment. The patient understands and consents the procedure. A timeout was performed prior to the  initiation of the procedure. Ultrasound scanning was performed of the left mid lateral ventral abdominal wall demonstrates an approximately 1.1 x 1.1 by 1.5 cm mixed echogenic nodule within the subcutaneous tissues which correlates with the hypermetabolic nodule seen on preceding PET-CT (representative images 3 and 7). The procedure was planned. The skin overlying the ventral left mid lateral abdomen was prepped and draped in the usual sterile fashion. The overlying soft tissues were anesthetized with 1% lidocaine with epinephrine. This was followed by 6 core biopsies with an 18 gauge core device under direct ultrasound guidance. Multiple ultrasound images were saved for procedural documentation purposes. Superficial hemostasis was obtained with manual compression. Post procedural scanning was negative for definitive area of hemorrhage or additional complication. A dressing was placed. The patient tolerated the procedure well without immediate post procedural complication. IMPRESSION: Technically successful ultrasound guided core needle biopsy of indeterminate hypermetabolic nodule within the subcutaneous tissues about the left mid lateral ventral abdominal wall. Electronically Signed   By: Simonne Come M.D.   On: 09/25/2015 16:05    ASSESSMENT and MEDICAL DECISION MAKING:  #Uncontrolled nausea, vomiting, dehydration-secondary to  chemotherapy. He improved significantly on scheduled Zofran and IV fluids. His hyponatremia, likely secondary from GI losses and poor by mouth intake, has improved as well. Will decrease normal saline to 75 mL/h, advance diet to regular, we will switch to oral Zofran and Compazine as needed. If able to maintain good oral nutrition/hydration, will consider discharging home tomorrow.  #Metastatic poorly differentiated lung carcinoma-patient is scheduled for cycle 2 of carboplatin and paclitaxel, but chemotherapy will need to be delayed.  #Antral fibrillation with RVR- heart rate is  much better controlled. Since the patient is somewhat hypotensive today, will hold Toprol today. We will restart tomorrow.   #DVT prophylaxis-currently on Coumadin.INR supratherapeutic, we'll continue to hold Coumadin and check INR tomorrow.   #Anxiety-we added a very low dose of Ativan 0.25 mg twice a day with significant improvement of anxiety.  #Bone pain-at this time appears to be reasonably well-controlled on a low dose fentanyl patch and Percocet for breakthrough pain. We will adjust treatment as needed.  #Advanced directives-full code.  Patient expressed understanding and was in agreement with this plan. He also understands that He can call clinic at any time with any questions, concerns, or complaints.    Primary cancer of left upper lobe of lung ([place])   Staging form: Lung, AJCC 7th Edition     Clinical: Stage IV (T2a, N2, M1b) - Signed by Forest Gleason, MD on 10/06/2015   Roxana Hires, MD   10/17/2015 9:08 AM

## 2015-10-17 NOTE — Progress Notes (Signed)
Patient tolerated a regular diet tray with no complications - no nausea, vomiting or pain. Madlyn Frankel, RN

## 2015-10-17 NOTE — Plan of Care (Deleted)
Problem: Education: Goal: Knowledge of [facility] Education information/materials will improve Outcome: Progressing Pt oriented to unit.  Explained need for insulin coverage and how prednisone effects blood sugar readings.  Pt verbalizes understanding of teaching.  Problem: Safety: Goal: Ability to remain free from injury will improve Outcome: Progressing Pt falls this shift.  Problem: Activity: Goal: Risk for activity intolerance will decrease Outcome: Progressing Increased respiratory treatments to every 4 hours resulted in better oxygenation and less shortness of breath with exertion.  Problem: Fluid Volume: Goal: Ability to maintain a balanced intake and output will improve Outcome: Progressing Pt taking in plenty of fluids with decreased appetite.  Problem: Nutrition: Goal: Adequate nutrition will be maintained Outcome: Progressing Encouraged pt to take an evening snack but reports no appetite.  Pt did take in fluids.

## 2015-10-17 NOTE — Plan of Care (Signed)
Problem: Education: Goal: Knowledge of [facility] [facility] Education information/materials will improve Outcome: Progressing Oriented to unit.   Problem: Safety: Goal: Ability to remain free from injury will improve Outcome: Progressing High risk for falls.  Bed alarm used and frequent rounding for toileting. No falls this shift.  Problem: Activity: Goal: Risk for activity intolerance will decrease Outcome: Progressing Pt sat up in chair.    Problem: Fluid Volume: Goal: Ability to maintain a balanced intake and output will improve Outcome: Progressing No reports of nausea or vomiting this shift.  Tolerated a clear liquid diet and HS snack of jello without difficulty.  Increased IV fluids to 125 cc/hr secondary to hypotension per doctor order.  Pt with good urinary output this shift.  Problem: Nutrition: Goal: Adequate nutrition will be maintained Outcome: Progressing Tolerating clear liquid diet

## 2015-10-17 NOTE — Progress Notes (Signed)
Initial Nutrition Assessment  DOCUMENTATION CODES:   Severe malnutrition in context of acute illness/injury  INTERVENTION:   Meals and Snacks: Cater to patient preferences; pt may benefit from smaller, more frequent meals. Pt reports the nursingg staff has been getting him snacks when he needs them, pt aware that snacks may be sent from dining services as well if he prefers something different like a sandwich or fruit, etc Medical Food Supplement Therapy: pt prefers Chocolate Boost, does not like Ensure. Will try Chocolate Carnation Instant Breakfast while here in hospital but recommend pt continue Boost as outpatient Coordination of Care: pt very tearful on visit today, reports he feels "nervous," hates sitting still and not doing anything, asking for something to take the edge off and reports to writer that he forgot to tell this to the doctor. Discussed with Kenneth Edwards RN  NUTRITION DIAGNOSIS:   Malnutrition related to acute illness, chronic illness, nausea, vomiting, cancer and cancer related treatments as evidenced by energy intake < or equal to 50% for > or equal to 5 days, percent weight loss.  GOAL:   Patient will meet greater than or equal to 90% of their needs  MONITOR:    (Energy Intake, Anthropometrics, Digestive System, Electrolyte./Renal Profile)  REASON FOR ASSESSMENT:   Consult Assessment of nutrition requirement/status  ASSESSMENT:    Pt admitted with dehydration due to chemotherapy induced N/V. 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 Hypertension   . Coronary artery disease   . Atrial fibrillation ([name]) 07/2015  . Primary cancer of left upper lobe of lung ([name]) 10/06/2015    Diet Order:  Diet regular Room service appropriate?: Yes; Fluid consistency:: Thin   Energy Intake: pt tolerated CL tray today, ate 100%; diet advanced to Regular  Food and nutrition related history: pt reports  inability to keep anything down for at least the past 5 days, maybe longer, not even able to keep water down. Pt reports prior to this pt eating normally, drinking 2-3 Boost per day but losing wt over the past 5 weeks or so  Skin:  Reviewed, no issues  Last BM:  12/26   Nutrition focused physical exam: Nutrition-Focused physical exam completed. Findings are no fat depletion, mild muscle depletion, and mild edema.   Electrolyte and Renal Profile:  Recent Labs Lab 10/16/15 0924 10/17/15 0607  BUN 51* 44*  CREATININE 1.47* 1.45*  NA 127* 135  K 4.6 5.0  MG  --  2.4   Glucose Profile: No results for input(s): GLUCAP in the last 72 hours.   Nutritional Anemia Profile:  CBC Latest Ref Rng 10/17/2015 10/16/2015 10/08/2015  WBC 3.8 - 10.6 K/uL 6.5 9.2 21.9(H)  Hemoglobin 13.0 - 18.0 g/dL 13.8 16.3 16.5  Hematocrit 40.0 - 52.0 % 40.7 48.4 49.2  Platelets 150 - 440 K/uL 198 257 289   Meds: fentanyl patch, NS at 75 ml/hr  Height:   Ht Readings from Last 1 Encounters:  10/16/15 '5\' 6"'$  (1.676 m)    Weight: pt reports losing wt at a rate of 1 pound per day over the past 5 weeks; pt reports he weighed 225 pounds about 5 weeks ago. According to weight encounters; pt with 12% wt loss in past 2 months per wt encounters. 6.6% wt loss in past 2 weeks or so.   Wt Readings from Last 1 Encounters:  10/16/15 183 lb (83.008 kg)   [name]  10/16/15 1205  Weight: 183 lb (83.008 kg)   Wt Readings from Last 10 Encounters:  10/16/15 183 lb (83.008 kg)  10/16/15 188 lb 7.9 oz (85.5 kg)  10/08/15 194 lb 0.1 oz (88 kg)  10/02/15 196 lb 13.9 oz (89.3 kg)  09/25/15 202 lb (91.627 kg)  09/24/15 201 lb 8 oz (91.4 kg)  09/20/15 201 lb 1 oz (91.2 kg)  09/19/15 200 lb (90.719 kg)  08/27/15 205 lb (92.987 kg)  08/16/15 208 lb 12.8 oz (94.711 kg)    BMI:  Body mass index is 29.55 kg/(m^2).  Estimated Nutritional Needs:   Kcal:  2127-2513 kcals (BEE 1487, 1.3 AF, 1.1-1.3 IF)   Protein:   100-125 g (1.2-1.5 g/kg)   Fluid:  2075-2490 mL (25-30 ml/kg)     HIGH Care Level  Kenneth Passey MS, RD, LDN [phone] Pager  [phone] Weekend/On-Call Pager

## 2015-10-17 NOTE — Progress Notes (Signed)
Patient moved closer to the nurses station to help decrease anxiety. Room has more windows per patient request. Family at bedside. Madlyn Frankel, RN

## 2015-10-17 NOTE — Progress Notes (Signed)
Dr. Rudean Hitt notified of patient bp of 86/50. Order to hold this AM dose of metoprolol and lisinopril. Order placed to further hold scheduled bp meds if SBP is less than 100. Madlyn Frankel, RN

## 2015-10-18 ENCOUNTER — Encounter
Admission: AD | Disposition: A | Discharge status: Home / Self Care | Payer: Self-pay | Source: Ambulatory Visit | Attending: Internal Medicine

## 2015-10-18 ENCOUNTER — Encounter: Payer: Self-pay | Admitting: *Deleted

## 2015-10-18 ENCOUNTER — Other Ambulatory Visit: Payer: Self-pay | Admitting: Family Medicine

## 2015-10-18 ENCOUNTER — Ambulatory Visit: Admission: RE | Admit: 2015-10-18 | Payer: Medicare Other | Source: Ambulatory Visit | Admitting: Vascular Surgery

## 2015-10-18 ENCOUNTER — Other Ambulatory Visit: Payer: Medicare Other

## 2015-10-18 ENCOUNTER — Telehealth: Payer: Self-pay | Admitting: *Deleted

## 2015-10-18 DIAGNOSIS — Z7901 Long term (current) use of anticoagulants: Secondary | ICD-10-CM

## 2015-10-18 HISTORY — PX: PERIPHERAL VASCULAR CATHETERIZATION: SHX172C

## 2015-10-18 LAB — CBC WITH DIFFERENTIAL/PLATELET
BASOS PCT: 1 %
Basophils Absolute: 0 10*3/uL (ref 0–0.1)
EOS ABS: 0.1 10*3/uL (ref 0–0.7)
Eosinophils Relative: 1 %
HCT: 41.3 % (ref 40.0–52.0)
Hemoglobin: 14 g/dL (ref 13.0–18.0)
Lymphocytes Relative: 12 %
Lymphs Abs: 0.7 10*3/uL — ABNORMAL LOW (ref 1.0–3.6)
MCH: 28 pg (ref 26.0–34.0)
MCHC: 33.9 g/dL (ref 32.0–36.0)
MCV: 82.8 fL (ref 80.0–100.0)
MONO ABS: 0.4 10*3/uL (ref 0.2–1.0)
MONOS PCT: 8 %
Neutro Abs: 4.4 10*3/uL (ref 1.4–6.5)
Neutrophils Relative %: 78 %
Platelets: 202 10*3/uL (ref 150–440)
RBC: 4.98 MIL/uL (ref 4.40–5.90)
RDW: 14.8 % — AB (ref 11.5–14.5)
WBC: 5.6 10*3/uL (ref 3.8–10.6)

## 2015-10-18 LAB — COMPREHENSIVE METABOLIC PANEL
ALBUMIN: 2.9 g/dL — AB (ref 3.5–5.0)
ALT: 47 U/L (ref 17–63)
ANION GAP: 4 — AB (ref 5–15)
AST: 24 U/L (ref 15–41)
Alkaline Phosphatase: 77 U/L (ref 38–126)
BILIRUBIN TOTAL: 0.6 mg/dL (ref 0.3–1.2)
BUN: 28 mg/dL — ABNORMAL HIGH (ref 6–20)
CO2: 20 mmol/L — AB (ref 22–32)
Calcium: 6.9 mg/dL — ABNORMAL LOW (ref 8.9–10.3)
Chloride: 109 mmol/L (ref 101–111)
Creatinine, Ser: 1.17 mg/dL (ref 0.61–1.24)
GFR calc Af Amer: >60 mL/min (ref >60)
GFR calc non Af Amer: 58 mL/min — ABNORMAL LOW (ref >60)
GLUCOSE: 98 mg/dL (ref 65–99)
POTASSIUM: 4.3 mmol/L (ref 3.5–5.1)
SODIUM: 133 mmol/L — AB (ref 135–145)
TOTAL PROTEIN: 5.6 g/dL — AB (ref 6.5–8.1)

## 2015-10-18 LAB — GLUCOSE, CAPILLARY: Glucose-Capillary: 88 mg/dL (ref 65–99)

## 2015-10-18 LAB — PROTIME-INR
INR: 3.1
Prothrombin Time: 31.4 seconds — ABNORMAL HIGH (ref 11.4–15.0)

## 2015-10-18 SURGERY — PORTA CATH INSERTION
Anesthesia: Moderate Sedation

## 2015-10-18 MED ORDER — LIDOCAINE-EPINEPHRINE (PF) 1 %-1:200000 IJ SOLN
INTRAMUSCULAR | Status: DC | PRN
  Administered 2015-10-18: 10 mL via INTRADERMAL

## 2015-10-18 MED ORDER — MIDAZOLAM HCL 5 MG/5ML IJ SOLN
INTRAMUSCULAR | Status: AC
  Filled 2015-10-18: qty 5

## 2015-10-18 MED ORDER — OXYCODONE-ACETAMINOPHEN 5-325 MG PO TABS
1.0000 | ORAL_TABLET | ORAL | Status: DC | PRN
Start: 2015-10-18 — End: 2015-10-18

## 2015-10-18 MED ORDER — LABETALOL HCL 5 MG/ML IV SOLN
10.0000 mg | INTRAVENOUS | Status: DC | PRN
  Filled 2015-10-18: qty 4

## 2015-10-18 MED ORDER — ACETAMINOPHEN 650 MG RE SUPP: 325.0000 mg | RECTAL | Status: DC | PRN

## 2015-10-18 MED ORDER — OXYCODONE-ACETAMINOPHEN 5-325 MG PO TABS: 1.0000 | ORAL_TABLET | ORAL | Status: DC | PRN

## 2015-10-18 MED ORDER — ONDANSETRON HCL 4 MG/2ML IJ SOLN: 4.0000 mg | Freq: Four times a day (QID) | INTRAMUSCULAR | Status: DC | PRN

## 2015-10-18 MED ORDER — FENTANYL CITRATE (PF) 100 MCG/2ML IJ SOLN
INTRAMUSCULAR | Status: DC | PRN
  Administered 2015-10-18: 50 ug via INTRAVENOUS

## 2015-10-18 MED ORDER — LORAZEPAM 0.5 MG PO TABS: 0.2500 mg | ORAL_TABLET | Freq: Two times a day (BID) | ORAL | Status: DC

## 2015-10-18 MED ORDER — METOPROLOL TARTRATE 1 MG/ML IV SOLN: 2.0000 mg | INTRAVENOUS | Status: DC | PRN

## 2015-10-18 MED ORDER — SODIUM CHLORIDE 0.9 % IV SOLN: 500.0000 mL | Freq: Once | INTRAVENOUS | Status: DC | PRN

## 2015-10-18 MED ORDER — PHENOL 1.4 % MT LIQD: 1.0000 | OROMUCOSAL | Status: DC | PRN

## 2015-10-18 MED ORDER — SODIUM CHLORIDE 0.9 % IV SOLN
INTRAVENOUS | Status: DC
Start: 2015-10-18 — End: 2015-10-18
  Administered 2015-10-18: 09:00:00 via INTRAVENOUS

## 2015-10-18 MED ORDER — FENTANYL CITRATE (PF) 100 MCG/2ML IJ SOLN
INTRAMUSCULAR | Status: AC
  Filled 2015-10-18: qty 2

## 2015-10-18 MED ORDER — WARFARIN SODIUM 5 MG PO TABS: ORAL_TABLET | ORAL | Status: DC

## 2015-10-18 MED ORDER — GUAIFENESIN-DM 100-10 MG/5ML PO SYRP: 15.0000 mL | ORAL_SOLUTION | ORAL | Status: DC | PRN

## 2015-10-18 MED ORDER — SODIUM CHLORIDE 0.9 % IR SOLN
Freq: Once | Status: AC
  Administered 2015-10-18: 11:00:00
  Filled 2015-10-18: qty 2

## 2015-10-18 MED ORDER — MIDAZOLAM HCL 2 MG/2ML IJ SOLN
INTRAMUSCULAR | Status: DC | PRN
  Administered 2015-10-18: 2 mg via INTRAVENOUS

## 2015-10-18 MED ORDER — HYDRALAZINE HCL 20 MG/ML IJ SOLN: 5.0000 mg | INTRAMUSCULAR | Status: DC | PRN

## 2015-10-18 MED ORDER — HEPARIN (PORCINE) IN NACL 2-0.9 UNIT/ML-% IJ SOLN
INTRAMUSCULAR | Status: AC
Start: 2015-10-18 — End: 2015-10-18
  Filled 2015-10-18: qty 500

## 2015-10-18 MED ORDER — DEXTROSE 5 % IV SOLN
1.5000 g | INTRAVENOUS | Status: DC
  Filled 2015-10-18: qty 1.5

## 2015-10-18 MED ORDER — LIDOCAINE-PRILOCAINE 2.5-2.5 % EX CREA: 1.0000 "application " | TOPICAL_CREAM | CUTANEOUS | Status: DC | PRN

## 2015-10-18 MED ORDER — ALUM & MAG HYDROXIDE-SIMETH 200-200-20 MG/5ML PO SUSP: 15.0000 mL | ORAL | Status: DC | PRN

## 2015-10-18 MED ORDER — MORPHINE SULFATE (PF) 2 MG/ML IV SOLN: 2.0000 mg | INTRAVENOUS | Status: DC | PRN

## 2015-10-18 MED ORDER — HYDROMORPHONE HCL 1 MG/ML IJ SOLN: 1.0000 mg | Freq: Once | INTRAMUSCULAR | Status: DC

## 2015-10-18 MED ORDER — LIDOCAINE-EPINEPHRINE (PF) 1 %-1:200000 IJ SOLN
INTRAMUSCULAR | Status: AC
  Filled 2015-10-18: qty 30

## 2015-10-18 MED ORDER — ACETAMINOPHEN 325 MG PO TABS: 325.0000 mg | ORAL_TABLET | ORAL | Status: DC | PRN

## 2015-10-18 SURGICAL SUPPLY — 11 items
BAG DECANTER STRL (MISCELLANEOUS) ×3 IMPLANT
CANNULA 5F STIFF (CANNULA) ×2 IMPLANT
PACK ANGIOGRAPHY (CUSTOM PROCEDURE TRAY) ×3 IMPLANT
PAD GROUND ADULT SPLIT (MISCELLANEOUS) ×3 IMPLANT
PENCIL ELECTRO HAND CTR (MISCELLANEOUS) ×3 IMPLANT
PORTACATH POWER 8F (Port) ×3 IMPLANT
PREP CHG 10.5 TEAL (MISCELLANEOUS) ×3 IMPLANT
SUT MNCRL AB 4-0 PS2 18 (SUTURE) ×3 IMPLANT
SUT PROLENE 0 CT 1 30 (SUTURE) ×3 IMPLANT
SUTURE VIC 3-0 (SUTURE) ×3 IMPLANT
TOWEL OR 17X26 4PK STRL BLUE (TOWEL DISPOSABLE) ×3 IMPLANT

## 2015-10-18 NOTE — Telephone Encounter (Signed)
Getting conflicting information from hospital and cancer center. He is being kept NPO for a port placement and she said that she or he did not think it was being done right now. Please call to discuss. When I called her back, she said she had talked to the nurse and got everything straight

## 2015-10-18 NOTE — Plan of Care (Signed)
Problem: Education: Goal: Knowledge of [facility] Education information/materials will improve Outcome: Progressing Pt A&O x 4 and has anxiety. Pt afebrile, BP 110s/50-60s, on RA SpO2 > 90%. Pt remains on telemetry for A-fib rate controlled in the 80s with PVCs. Pt was advanced to a regular diet on day shift. Pt ate approx 40% of dinner. Remains on NS @ 83m/hr

## 2015-10-18 NOTE — Op Note (Signed)
      [facility]       Operative Note  Date: 10/18/2015  Preoperative diagnosis:  1. Lung cancer  Postoperative diagnosis:  Same as above  Procedures: #1. Ultrasound guidance for vascular access to the right internal jugular vein. #2. Right jugular venogram through right jugular micropuncture sheath #3. Fluoroscopic guidance for placement of catheter. #4. Placement of CT compatible Port-A-Cath, right internal jugular vein.  Surgeon: Leotis Pain, MD.   Anesthesia: Local with moderate conscious sedation.  Fluoroscopy time: less than 1 minute  Contrast used: 0  Estimated blood loss: 25 cc  Indication for the procedure:  The patient has a left upper lobe lung cancer.  The patient needs a Port-A-Cath for durable venous access, chemotherapy, lab draws, and CT scans. We are asked to place this. Risks and benefits were discussed and informed consent was obtained.  Description of procedure: The patient was brought to the vascular and interventional radiology suite. The right neck chest and shoulder were sterilely prepped and draped, and a sterile surgical field was created. Ultrasound was used to help visualize a patent but small right internal jugular vein. It was originally accessed with a Seldinger needle but a J-wire would not pass. This was then accessed under direct ultrasound guidance with minimal difficulty with the micropuncture needle and a permanent image was recorded. A micropuncture wire would not easily pass on initial attempts. I then re-access the vein and a slightly superior location and was able to get a wire to pass better into the superior vena cava. A micropuncture sheath was placed. I then performed a right jugular venogram through the micropuncture sheath in the right jugular vein. This showed some mild narrowing and tortuosity which was significant just above the clavicle as the jugular vein entered the innominate vein, but using this image I was able  to get a wire to pass beyond this and go through the atrium into the inferior vena cava. After skin nick and dilatation, the peel-away sheath was then placed over the wire. I then anesthetized an area under the clavicle approximately 1-2 fingerbreadths. A transverse incision was created and an inferior pocket was created with electrocautery and blunt dissection. The port was then brought onto the field, placed into the pocket and secured to the chest wall with 2 Prolene sutures. The catheter was connected to the port and tunneled from the subclavicular incision to the access site. Fluoroscopic guidance was then used to cut the catheter to an appropriate length. The catheter was then placed through the peel-away sheath and the peel-away sheath was removed. The catheter tip was parked in excellent location under fluorocoscopic guidance in the superior vena cava just above the right atrium. The pocket was then irrigated with antibiotic impregnated saline and the wound was closed with a running 3-0 Vicryl and a 4-0 Monocryl. The access incision was closed with a single 4-0 Monocryl. The Huber needle was used to withdraw blood and flush the port with heparinized saline. Dermabond was then placed as a dressing. The patient tolerated the procedure well and was taken to the recovery room in stable condition.   DEW,JASON 10/18/2015 10:50 AM

## 2015-10-18 NOTE — Care Management Important Message (Signed)
Important Message  Patient Details  Name: [name] MRN: [id number] Date of Birth: 08/22/1935   Medicare Important Message Given:  Yes    Juliann Pulse A Eugenia Eldredge 10/18/2015, 10:25 AM

## 2015-10-18 NOTE — Progress Notes (Signed)
Pt and family given d/c instructions r/t follow up care, medications and prescriptions x 2 given to wife. Voiced understanding, pt d/c home via wheelchair escorted by staff and family

## 2015-10-18 NOTE — H&P (Signed)
  [facility] History & Physical Update  The patient was interviewed and re-examined.  The patient's previous History and Physical has been reviewed and is unchanged.  There is no change in the plan of care. We plan to proceed with the scheduled procedure.  Brinnley Lacap, MD  10/18/2015, 9:45 AM

## 2015-10-18 NOTE — Discharge Summary (Signed)
[place] @ [facility] [phone]  Fax:([phone]) [name]: 04-03-1935  MR#: [id number]  HDQ#:[id number]  Patient Care Team: Pcp Not In System as PCP - General Carloyn Manner, MD as Referring Physician (Otolaryngology)  CHIEF COMPLAINT: No chief complaint on file.    No history exists.    Oncology Flowsheet 05/05/2012 12/26/2014 10/02/2015 10/08/2015 10/16/2015 10/16/2015 10/17/2015  Day, Cycle - - - Day 1, Cycle 1 - - -  CARBOplatin (PARAPLATIN) IV - - - 330 mg - - -  denosumab (XGEVA) [place] - - - 120 mg - - -  dexamethasone (DECADRON) IV - - [ 10 mg ] [ 12 mg ] - - -  fosaprepitant (EMEND) IV - - - [ 150 mg ] - - -  ondansetron (ZOFRAN) IV 4 mg 4 mg [ 8 mg ] - 8 mg 8 mg 8 mg  PACLitaxel (TAXOL) IV - - - 100 mg/m2 - - -  palonosetron (ALOXI) IV - - - 0.25 mg - - -    HISTORY OF PRESENT ILLNESS:   [name] is a 80 year old gentleman with recently diagnosed metastatic poorly differentiated carcinoma likely of lung origin, with multiple metastatic lesions, including bones, chest wall, with pathologic fractures. He started chemotherapy with carboplatin given every 3 weeks and weekly paclitaxel one week ago, and tolerated that extremely poorly. She came to our clinic yesterday, and according to him and his family he had not been able to eat or drink for the past 5 days, experiencing multiple episodes of nausea and vomiting, with poor relief from oral Zofran. He complained of right sided chest pain and tenderness to palpation, which he attributes to rib fractures. He also is very anxious, worrying about his current state. He claims that initially he had multiple diarrheal bowel movements, but has not had any bowel movements for the past 24 hours. He also complains of cough with greenish sputum. He denies fevers, chills, night sweats, bleeding from any source.  Hospital course: Patient was admitted for management of uncontrolled chemotherapy-induced nausea and  vomiting. He required IV fluids continuously and intravenous Zofran for 48 hours with significant improvement of his symptoms. His nausea has resolved, and he was able to tolerate regular diet eventually.  He also was found to have supratherapeutic INR, which is likely is secondary to his poor by mouth intake. INR has been coming down, while Coumadin was held. Patient had Port-A-Cath placed on the day of discharge.   As of 10/18/2015 patient was safe for discharge.    REVIEW OF SYSTEMS:   ROS   PAST MEDICAL HISTORY: Past Medical History  Diagnosis Date  . Hypertension   . Coronary artery disease   . Atrial fibrillation ([place]) 07/2015  . Primary cancer of left upper lobe of lung ([place]) 10/06/2015    PAST SURGICAL HISTORY: Past Surgical History  Procedure Laterality Date  . Coronary artery bypass graft  12/10/2005    LIMA to LAD,SVG to diagonal,SVG to ramus intermediate,SVG to distal RCA.  . Tonsillectomy    . Tee without cardioversion  05/10/2012    Procedure: TRANSESOPHAGEAL ECHOCARDIOGRAM (TEE);  Surgeon: [name], MD;  Location: [facility] ENDOSCOPY;  Service: Cardiovascular;  Laterality: N/A;  . Cardioversion  05/10/2012    Procedure: CARDIOVERSION;  Surgeon: [name], MD;  Location: [facility] ENDOSCOPY;  Service: Cardiovascular;  Laterality: N/A;  . Cardiac catheterization  10/28/2005    significant 3 vessel CAD  . Nm myocar perf wall motion  09/16/2005    evidence of diaphragmatic attenuation & subtle anterolateral ischemia.  . Cardioversion N/A 07/31/2015    Procedure: CARDIOVERSION;  Surgeon: Josue Hector, MD;  Location: [facility] ENDOSCOPY;  Service: Cardiovascular;  Laterality: N/A;    FAMILY HISTORY Family History  Problem Relation Age of Onset  . Stroke Mother 27  . Stroke Father 79  . Heart attack Father 72  . Heart attack Brother 49  . Cancer Sister 61    ADVANCED DIRECTIVES:  No flowsheet data found.  HEALTH MAINTENANCE: Social History  Substance Use Topics    . Smoking status: Former Smoker -- 1.00 packs/day for 40 years    Types: Cigarettes    Quit date: 09/19/2005  . Smokeless tobacco: Never Used  . Alcohol Use: No     Allergies  Allergen Reactions  . Clonidine Derivatives Other (See Comments)    Pt was not aware of a reaction to this medication  . Diltiazem Hcl Er Other (See Comments)    High blood pressure  . Statins Other (See Comments)    Myalgias:  Previously on Crestor,Vytorin,Simvastatin,Lipitor & Pravastatin  . Sulfa Antibiotics Swelling    Current Facility-Administered Medications  Medication Dose Route Frequency Provider Last Rate Last Dose  . 0.9 %  sodium chloride infusion  500 mL Intravenous Once PRN Algernon Huxley, MD      . acetaminophen (TYLENOL) tablet 325-650 mg  325-650 mg Oral Q4H PRN Algernon Huxley, MD       Or  . acetaminophen (TYLENOL) suppository 325-650 mg  325-650 mg Rectal Q4H PRN Algernon Huxley, MD      . alum & mag hydroxide-simeth (MAALOX/MYLANTA) 200-200-20 MG/5ML suspension 15-30 mL  15-30 mL Oral Q2H PRN Algernon Huxley, MD      . aspirin EC tablet 81 mg  81 mg Oral Daily Aeron Donaghey, MD   81 mg at 10/18/15 1353  . fentaNYL (DURAGESIC - dosed mcg/hr) 12.5 mcg  12.5 mcg Transdermal Q72H Ladon Vandenberghe, MD   12.5 mcg at 10/16/15 1357  . guaiFENesin-dextromethorphan (ROBITUSSIN DM) 100-10 MG/5ML syrup 15 mL  15 mL Oral Q4H PRN Algernon Huxley, MD      . hydrALAZINE (APRESOLINE) injection 5 mg  5 mg Intravenous Q20 Min PRN Algernon Huxley, MD      . HYDROcodone-acetaminophen (NORCO/VICODIN) 5-325 MG per tablet 1 tablet  1 tablet Oral Q6H PRN Roxana Hires, MD   1 tablet at 10/18/15 1914  . labetalol (NORMODYNE,TRANDATE) injection 10 mg  10 mg Intravenous Q10 min PRN Algernon Huxley, MD      . lisinopril (PRINIVIL,ZESTRIL) tablet 10 mg  10 mg Oral Daily Roxana Hires, MD   10 mg at 10/18/15 0852  . metoprolol (LOPRESSOR) injection 2-5 mg  2-5 mg Intravenous Q2H PRN Algernon Huxley, MD      . metoprolol succinate  (TOPROL-XL) 24 hr tablet 50 mg  50 mg Oral Daily Roxana Hires, MD   50 mg at 10/18/15 0852  . morphine 2 MG/ML injection 2-4 mg  2-4 mg Intravenous Q1H PRN Algernon Huxley, MD      . ondansetron [facility]) injection 4 mg  4 mg Intravenous Q6H PRN Algernon Huxley, MD      . ondansetron [facility]) tablet 8 mg  8 mg Oral Q8H PRN Beckie Viscardi, MD      . oxyCODONE-acetaminophen (PERCOCET/ROXICET) 5-325 MG per tablet 1-2 tablet  1-2 tablet Oral Q4H PRN Algernon Huxley, MD      .  pantoprazole (PROTONIX) EC tablet 40 mg  40 mg Oral Daily Roxana Hires, MD   40 mg at 10/18/15 0852  . phenol (CHLORASEPTIC) mouth spray 1 spray  1 spray Mouth/Throat PRN Algernon Huxley, MD      . prochlorperazine (COMPAZINE) tablet 10 mg  10 mg Oral Q6H PRN Addis Bennie, MD      . senna-docusate (Senokot-S) tablet 1 tablet  1 tablet Oral QHS PRN Chairty Toman, MD      . tamsulosin (FLOMAX) capsule 0.4 mg  0.4 mg Oral QPC supper Roxana Hires, MD   0.4 mg at 10/17/15 1711   Current Outpatient Prescriptions  Medication Sig Dispense Refill  . aspirin EC 81 MG tablet Take 81 mg by mouth daily.    . fentaNYL (DURAGESIC - DOSED MCG/HR) 12 MCG/HR Place 1 patch (12.5 mcg total) onto the skin every 3 (three) days. 10 patch 0  . lidocaine-prilocaine (EMLA) cream Apply 1 application topically as needed. 30 g 2  . lisinopril (PRINIVIL,ZESTRIL) 10 MG tablet Take 1 tablet (10 mg total) by mouth daily. 90 tablet 3  . LORazepam (ATIVAN) 0.5 MG tablet Take 0.5 tablets (0.25 mg total) by mouth 2 (two) times daily. 60 tablet 0  . metoprolol succinate (TOPROL-XL) 50 MG 24 hr tablet Take 1 tablet (50 mg total) by mouth daily. (Patient not taking: Reported on 10/16/2015) 90 tablet 3  . omeprazole (PRILOSEC) 20 MG capsule Take 1 capsule (20 mg total) by mouth 2 (two) times daily before a meal. 60 capsule 3  . ondansetron (ZOFRAN) 4 MG tablet Take 1 tablet (4 mg total) by mouth every 8 (eight) hours as needed for nausea or vomiting. 30 tablet 0   . oxyCODONE-acetaminophen (PERCOCET/ROXICET) 5-325 MG tablet Take 1-2 tablets by mouth every 4 (four) hours as needed for moderate pain. 30 tablet 0  . predniSONE (DELTASONE) 20 MG tablet Take 1 tablet (20 mg total) by mouth daily with breakfast. 30 tablet 3  . Tamsulosin HCl (FLOMAX) 0.4 MG CAPS Take 0.4 mg by mouth daily after supper.    . warfarin (COUMADIN) 5 MG tablet 0.5 tablet (2.'5mg'$ ) daily 90 tablet 0    OBJECTIVE:  Filed Vitals:   10/18/15 1157 10/18/15 1246  BP: 117/59 111/56  Pulse:  93  Temp: 97.6 F (36.4 C) 97.8 F (36.6 C)  Resp: 20 20     Body mass index is 29.55 kg/(m^2).    ECOG FS:2 - Symptomatic, <50% confined to bed  Physical Exam  Constitutional: He is oriented to person, place, and time.  Elderly Caucasian male in no significant distress  HENT:  Head: Normocephalic and atraumatic.  Right Ear: External ear normal.  Left Ear: External ear normal.  Nose: Nose normal.  Mouth/Throat: Oropharynx is clear and moist. No oropharyngeal exudate.  Eyes: Conjunctivae and EOM are normal. Pupils are equal, round, and reactive to light. Right eye exhibits no discharge. Left eye exhibits no discharge. No scleral icterus.  Neck: Normal range of motion. Neck supple. No JVD present. No tracheal deviation present. No thyromegaly present.  Cardiovascular: Normal rate, regular rhythm, normal heart sounds and intact distal pulses.  Exam reveals no gallop and no friction rub.   No murmur heard. Pulmonary/Chest: Effort normal and breath sounds normal. No stridor. No respiratory distress. He has no wheezes. He has no rales. He exhibits no tenderness.  Abdominal: Soft. Bowel sounds are normal. He exhibits no distension and no mass. There is no tenderness. There is no rebound and no  guarding.  Genitourinary:  Patient deferred  Musculoskeletal: Normal range of motion. He exhibits no edema or tenderness.  Lymphadenopathy:    He has no cervical adenopathy.  Neurological: He is alert  and oriented to person, place, and time. He has normal reflexes. No cranial nerve deficit. He exhibits normal muscle tone. Gait normal. Coordination normal. GCS score is 15.  Skin: Skin is warm and dry. No rash noted. He is not diaphoretic. No erythema. No pallor.  Psychiatric: Mood, memory, affect and judgment normal.  Vitals reviewed.    LAB RESULTS:  CBC Latest Ref Rng 10/18/2015 10/17/2015  WBC 3.8 - 10.6 K/uL 5.6 6.5  Hemoglobin 13.0 - 18.0 g/dL 14.0 13.8  Hematocrit 40.0 - 52.0 % 41.3 40.7  Platelets 150 - 440 K/uL 202 198    Admission on 10/16/2015, Discharged on 10/18/2015  Component Date Value Ref Range Status  . Prothrombin Time 10/16/2015 33.7* 11.4 - 15.0 seconds Final  . INR 10/16/2015 3.41   Final  . WBC 10/17/2015 6.5  3.8 - 10.6 K/uL Final  . RBC 10/17/2015 4.98  4.40 - 5.90 MIL/uL Final  . Hemoglobin 10/17/2015 13.8  13.0 - 18.0 g/dL Final  . HCT 10/17/2015 40.7  40.0 - 52.0 % Final  . MCV 10/17/2015 81.7  80.0 - 100.0 fL Final  . MCH 10/17/2015 27.6  26.0 - 34.0 pg Final  . MCHC 10/17/2015 33.8  32.0 - 36.0 g/dL Final  . RDW 10/17/2015 14.5  11.5 - 14.5 % Final  . Platelets 10/17/2015 198  150 - 440 K/uL Final  . Sodium 10/17/2015 135  135 - 145 mmol/L Final  . Potassium 10/17/2015 5.0  3.5 - 5.1 mmol/L Final  . Chloride 10/17/2015 105  101 - 111 mmol/L Final  . CO2 10/17/2015 25  22 - 32 mmol/L Final  . Glucose, Bld 10/17/2015 97  65 - 99 mg/dL Final  . BUN 10/17/2015 44* 6 - 20 mg/dL Final  . Creatinine, Ser 10/17/2015 1.45* 0.61 - 1.24 mg/dL Final  . Calcium 10/17/2015 7.5* 8.9 - 10.3 mg/dL Final  . Total Protein 10/17/2015 5.6* 6.5 - 8.1 g/dL Final  . Albumin 10/17/2015 2.7* 3.5 - 5.0 g/dL Final  . AST 10/17/2015 18  15 - 41 U/L Final  . ALT 10/17/2015 40  17 - 63 U/L Final  . Alkaline Phosphatase 10/17/2015 63  38 - 126 U/L Final  . Total Bilirubin 10/17/2015 0.9  0.3 - 1.2 mg/dL Final  . GFR calc non Af Amer 10/17/2015 45* >60 mL/min Final  . GFR  calc Af Amer 10/17/2015 52* >60 mL/min Final   Comment: (NOTE) The eGFR has been calculated using the CKD EPI equation. This calculation has not been validated in all clinical situations. eGFR's persistently <60 mL/min signify possible Chronic Kidney Disease.   . Anion gap 10/17/2015 5  5 - 15 Final  . Magnesium 10/17/2015 2.4  1.7 - 2.4 mg/dL Final  . Prothrombin Time 10/17/2015 35.5* 11.4 - 15.0 seconds Final  . INR 10/17/2015 3.65   Final  . WBC 10/18/2015 5.6  3.8 - 10.6 K/uL Final  . RBC 10/18/2015 4.98  4.40 - 5.90 MIL/uL Final  . Hemoglobin 10/18/2015 14.0  13.0 - 18.0 g/dL Final  . HCT 10/18/2015 41.3  40.0 - 52.0 % Final  . MCV 10/18/2015 82.8  80.0 - 100.0 fL Final  . MCH 10/18/2015 28.0  26.0 - 34.0 pg Final  . MCHC 10/18/2015 33.9  32.0 - 36.0 g/dL Final  .  RDW 10/18/2015 14.8* 11.5 - 14.5 % Final  . Platelets 10/18/2015 202  150 - 440 K/uL Final  . Neutrophils Relative % 10/18/2015 78   Final  . Neutro Abs 10/18/2015 4.4  1.4 - 6.5 K/uL Final  . Lymphocytes Relative 10/18/2015 12   Final  . Lymphs Abs 10/18/2015 0.7* 1.0 - 3.6 K/uL Final  . Monocytes Relative 10/18/2015 8   Final  . Monocytes Absolute 10/18/2015 0.4  0.2 - 1.0 K/uL Final  . Eosinophils Relative 10/18/2015 1   Final  . Eosinophils Absolute 10/18/2015 0.1  0 - 0.7 K/uL Final  . Basophils Relative 10/18/2015 1   Final  . Basophils Absolute 10/18/2015 0.0  0 - 0.1 K/uL Final  . Sodium 10/18/2015 133* 135 - 145 mmol/L Final  . Potassium 10/18/2015 4.3  3.5 - 5.1 mmol/L Final  . Chloride 10/18/2015 109  101 - 111 mmol/L Final  . CO2 10/18/2015 20* 22 - 32 mmol/L Final  . Glucose, Bld 10/18/2015 98  65 - 99 mg/dL Final  . BUN 10/18/2015 28* 6 - 20 mg/dL Final  . Creatinine, Ser 10/18/2015 1.17  0.61 - 1.24 mg/dL Final  . Calcium 10/18/2015 6.9* 8.9 - 10.3 mg/dL Final  . Total Protein 10/18/2015 5.6* 6.5 - 8.1 g/dL Final  . Albumin 10/18/2015 2.9* 3.5 - 5.0 g/dL Final  . AST 10/18/2015 24  15 - 41 U/L  Final  . ALT 10/18/2015 47  17 - 63 U/L Final  . Alkaline Phosphatase 10/18/2015 77  38 - 126 U/L Final  . Total Bilirubin 10/18/2015 0.6  0.3 - 1.2 mg/dL Final  . GFR calc non Af Amer 10/18/2015 58* >60 mL/min Final  . GFR calc Af Amer 10/18/2015 >60  >60 mL/min Final   Comment: (NOTE) The eGFR has been calculated using the CKD EPI equation. This calculation has not been validated in all clinical situations. eGFR's persistently <60 mL/min signify possible Chronic Kidney Disease.   . Anion gap 10/18/2015 4* 5 - 15 Final  . Prothrombin Time 10/18/2015 31.4* 11.4 - 15.0 seconds Final  . INR 10/18/2015 3.10   Final  . Glucose-Capillary 10/18/2015 88  65 - 99 mg/dL Final  Appointment on 10/16/2015  Component Date Value Ref Range Status  . WBC 10/16/2015 9.2  3.8 - 10.6 K/uL Final  . RBC 10/16/2015 5.94* 4.40 - 5.90 MIL/uL Final  . Hemoglobin 10/16/2015 16.3  13.0 - 18.0 g/dL Final  . HCT 10/16/2015 48.4  40.0 - 52.0 % Final  . MCV 10/16/2015 81.4  80.0 - 100.0 fL Final  . MCH 10/16/2015 27.5  26.0 - 34.0 pg Final  . MCHC 10/16/2015 33.7  32.0 - 36.0 g/dL Final  . RDW 10/16/2015 14.5  11.5 - 14.5 % Final  . Platelets 10/16/2015 257  150 - 440 K/uL Final  . Neutrophils Relative % 10/16/2015 87   Final  . Neutro Abs 10/16/2015 8.1* 1.4 - 6.5 K/uL Final  . Lymphocytes Relative 10/16/2015 7   Final  . Lymphs Abs 10/16/2015 0.6* 1.0 - 3.6 K/uL Final  . Monocytes Relative 10/16/2015 4   Final  . Monocytes Absolute 10/16/2015 0.4  0.2 - 1.0 K/uL Final  . Eosinophils Relative 10/16/2015 1   Final  . Eosinophils Absolute 10/16/2015 0.1  0 - 0.7 K/uL Final  . Basophils Relative 10/16/2015 1   Final  . Basophils Absolute 10/16/2015 0.0  0 - 0.1 K/uL Final  . Sodium 10/16/2015 127* 135 - 145 mmol/L Final  .  Potassium 10/16/2015 4.6  3.5 - 5.1 mmol/L Final  . Chloride 10/16/2015 97* 101 - 111 mmol/L Final  . CO2 10/16/2015 20* 22 - 32 mmol/L Final  . Glucose, Bld 10/16/2015 147* 65 - 99 mg/dL  Final  . BUN 10/16/2015 51* 6 - 20 mg/dL Final  . Creatinine, Ser 10/16/2015 1.47* 0.61 - 1.24 mg/dL Final  . Calcium 10/16/2015 7.8* 8.9 - 10.3 mg/dL Final  . GFR calc non Af Amer 10/16/2015 44* >60 mL/min Final  . GFR calc Af Amer 10/16/2015 51* >60 mL/min Final   Comment: (NOTE) The eGFR has been calculated using the CKD EPI equation. This calculation has not been validated in all clinical situations. eGFR's persistently <60 mL/min signify possible Chronic Kidney Disease.   . Anion gap 10/16/2015 10  5 - 15 Final       STUDIES: Nm Pet Image Initial (pi) Skull Base To Thigh  09/19/2015  CLINICAL DATA:  Initial treatment strategy for lung mass. EXAM: NUCLEAR MEDICINE PET SKULL BASE TO THIGH TECHNIQUE: 12.7 mCi F-18 FDG was injected intravenously. Full-ring PET imaging was performed from the skull base to thigh after the radiotracer. CT data was obtained and used for attenuation correction and anatomic localization. FASTING BLOOD GLUCOSE:  Value: 118 mg/dl COMPARISON:  Chest CT 09/14/2015. CT the abdomen and pelvis 12/27/2014. Neck CT 09/14/2015. FINDINGS: NECK No hypermetabolic lymph nodes in the neck. CHEST 2.8 x 2.8 cm left upper lobe nodule is hypermetabolic (SUVmax = 9.9), and makes contact with the overlying pleura. There is extensive hypermetabolic left hilar lymphadenopathy (SUVmax = 16 point to), which is contiguous with multiple adjacent nonenlarged and mildly enlarged but very hypermetabolic mediastinal lymph nodes throughout the prevascular, low left paratracheal and subcarinal nodal stations. Subcarinal lymph node measures 13 mm in short axis (SUVmax = 10.1). There is also nonenlarged but hypermetabolic right hilar lymphadenopathy (SUVmax = 8.3). Nonenlarged but hypermetabolic superior mediastinal and left supraclavicular lymph nodes are also noted (SUVmax = 6.0). 8 mm nodule in the anterior aspect of the right upper lobe (image 92 of series 4) is also a hypermetabolic (SUVmax =  4.4), presumably metastatic. Heart size is mildly enlarged with right atrial dilatation. There is no significant pericardial fluid, thickening or pericardial calcification. There is atherosclerosis of the thoracic aorta, the great vessels of the mediastinum and the coronary arteries, including calcified atherosclerotic plaque in the left main, left anterior descending, left circumflex and right coronary arteries. Status post median sternotomy for CABG, including LIMA to the LAD. Calcifications of the aortic valve. ABDOMEN/PELVIS Small focus of hypermetabolism (SUVmax = 6.6)in segment 7 of the liver without corresponding CT abnormality. 1.4 cm left adrenal nodule is hypermetabolic (SUVmax = 4.4). No abnormal hypermetabolic activity within the pancreas, right adrenal gland, or spleen. No hypermetabolic lymph nodes in the abdomen or pelvis. SKELETON Multiple areas of hypermetabolism in the skeletal musculature, compatible with soft tissue metastasis, including a lesion in the right subscapularis muscle (SUVmax = 7.6), and a lesion in the left anterior abdominal wall musculature (SUVmax = 7.2). In addition, there are innumerable areas of hypermetabolism throughout the visualized axial and appendicular skeleton. The majority of these lesions are occult by CT imaging. Some of these lesions include an expansile lesion with minimally displaced pathologic fracture (SUVmax = 8.7) in the lateral aspect of the right seventh rib (image 111 of series 4), a large diffusely infiltrative hypermetabolic (SUVmax = 17.4)YCXKGY in L1, and a large lesion in the left ilium (SUVmax = 17.9). Multiple other lesions  are noted, including bilateral femoral neck lesions. IMPRESSION: 1. 2.8 cm hypermetabolic left upper lobe pulmonary nodule with bilateral hilar and mediastinal lymphadenopathy, metastatic lesions in right upper lobe of the lung, the segment 7 of the liver and in the left adrenal gland, multiple skeletal muscle metastasis, as well  as metastatic lesions throughout all aspects of the visualized axial and appendicular skeleton, as discussed above, most likely to represent stage IV lung cancer. 2. Additional incidental findings, as above. Electronically Signed   By: Vinnie Langton M.D.   On: 09/19/2015 11:53   US Biopsy  09/25/2015  INDICATION: History of presumed metastatic lung cancer. Please perform ultrasound-guided biopsy of hypermetabolic subcutaneous nodule within the ventral mid lateral abdominal wall for tissue diagnostic purposes EXAM: ULTRASOUND GUIDED SUBCUTANEOUS NODULE BIOPSY COMPARISON:  PET-CT - 09/19/2015 MEDICATIONS: None ANESTHESIA/SEDATION: None COMPLICATIONS: None immediate PROCEDURE: Informed written consent was obtained from the patient after a discussion of the risks, benefits and alternatives to treatment. The patient understands and consents the procedure. A timeout was performed prior to the initiation of the procedure. Ultrasound scanning was performed of the left mid lateral ventral abdominal wall demonstrates an approximately 1.1 x 1.1 by 1.5 cm mixed echogenic nodule within the subcutaneous tissues which correlates with the hypermetabolic nodule seen on preceding PET-CT (representative images 3 and 7). The procedure was planned. The skin overlying the ventral left mid lateral abdomen was prepped and draped in the usual sterile fashion. The overlying soft tissues were anesthetized with 1% lidocaine with epinephrine. This was followed by 6 core biopsies with an 18 gauge core device under direct ultrasound guidance. Multiple ultrasound images were saved for procedural documentation purposes. Superficial hemostasis was obtained with manual compression. Post procedural scanning was negative for definitive area of hemorrhage or additional complication. A dressing was placed. The patient tolerated the procedure well without immediate post procedural complication. IMPRESSION: Technically successful ultrasound guided  core needle biopsy of indeterminate hypermetabolic nodule within the subcutaneous tissues about the left mid lateral ventral abdominal wall. Electronically Signed   By: Sandi Mariscal M.D.   On: 09/25/2015 16:05    Postdischarge recommendations:  #Nausea-use Zofran 8 mg by mouth every 8 hours as needed  #Dehydration-continue with regular diet and oral intake.  #Metastatic poorly differentiated lung carcinoma-patient is scheduled to be seen by Dr. Oliva Bustard early next week, at that point the decision regarding cycle 2 of carboplatin and paclitaxel will be made  #Antral fibrillation with RVR- heart rate is much better controlled. We will restart Toprol. INR is close to therapeutic range. Patient had Port-A-Cath placed on the day of discharge. The patient is asked not to take Coumadin until 10/22/2015, at that point restart Coumadin at 2.5 mg at bedtime. He is advised to watch for signs of bleeding around the site of Port-A-Cath placement, and go to the emergency room if any concerns appear.  #Bone pain-at this time appears to be reasonably well-controlled on a low dose fentanyl patch and Percocet for breakthrough pain.   #Advanced directives-full code.  Patient expressed understanding and was in agreement with this plan. He also understands that He can call clinic at any time with any questions, concerns, or complaints.    Primary cancer of left upper lobe of lung ([place])   Staging form: Lung, AJCC 7th Edition     Clinical: Stage IV (T2a, N2, M1b) - Signed by Forest Gleason, MD on 10/06/2015   Roxana Hires, MD   10/18/2015 3:15 PM

## 2015-10-19 ENCOUNTER — Telehealth: Payer: Self-pay | Admitting: *Deleted

## 2015-10-19 ENCOUNTER — Encounter: Payer: Self-pay | Admitting: Vascular Surgery

## 2015-10-19 MED ORDER — ONDANSETRON HCL 4 MG PO TABS: 4.0000 mg | ORAL_TABLET | Freq: Three times a day (TID) | ORAL | Status: DC | PRN

## 2015-10-19 NOTE — Telephone Encounter (Signed)
Escribed

## 2015-10-23 ENCOUNTER — Inpatient Hospital Stay: Payer: Medicare Other | Attending: Oncology

## 2015-10-23 ENCOUNTER — Inpatient Hospital Stay (HOSPITAL_BASED_OUTPATIENT_CLINIC_OR_DEPARTMENT_OTHER): Payer: Medicare Other | Admitting: Oncology

## 2015-10-23 ENCOUNTER — Encounter: Payer: Self-pay | Admitting: Oncology

## 2015-10-23 VITALS — BP 120/82 | HR 105 | Temp 96.9°F | Wt 191.6 lb

## 2015-10-23 DIAGNOSIS — Z5111 Encounter for antineoplastic chemotherapy: Secondary | ICD-10-CM | POA: Insufficient documentation

## 2015-10-23 DIAGNOSIS — Z79899 Other long term (current) drug therapy: Secondary | ICD-10-CM | POA: Diagnosis not present

## 2015-10-23 DIAGNOSIS — I10 Essential (primary) hypertension: Secondary | ICD-10-CM

## 2015-10-23 DIAGNOSIS — Z7901 Long term (current) use of anticoagulants: Secondary | ICD-10-CM | POA: Insufficient documentation

## 2015-10-23 DIAGNOSIS — R0602 Shortness of breath: Secondary | ICD-10-CM | POA: Diagnosis not present

## 2015-10-23 DIAGNOSIS — C7951 Secondary malignant neoplasm of bone: Secondary | ICD-10-CM

## 2015-10-23 DIAGNOSIS — J209 Acute bronchitis, unspecified: Secondary | ICD-10-CM | POA: Insufficient documentation

## 2015-10-23 DIAGNOSIS — Z7982 Long term (current) use of aspirin: Secondary | ICD-10-CM | POA: Diagnosis not present

## 2015-10-23 DIAGNOSIS — I25810 Atherosclerosis of coronary artery bypass graft(s) without angina pectoris: Secondary | ICD-10-CM

## 2015-10-23 DIAGNOSIS — I4891 Unspecified atrial fibrillation: Secondary | ICD-10-CM | POA: Diagnosis not present

## 2015-10-23 DIAGNOSIS — R112 Nausea with vomiting, unspecified: Secondary | ICD-10-CM | POA: Diagnosis not present

## 2015-10-23 DIAGNOSIS — R351 Nocturia: Secondary | ICD-10-CM | POA: Insufficient documentation

## 2015-10-23 DIAGNOSIS — C3490 Malignant neoplasm of unspecified part of unspecified bronchus or lung: Secondary | ICD-10-CM

## 2015-10-23 DIAGNOSIS — J3801 Paralysis of vocal cords and larynx, unilateral: Secondary | ICD-10-CM | POA: Insufficient documentation

## 2015-10-23 DIAGNOSIS — R05 Cough: Secondary | ICD-10-CM | POA: Diagnosis not present

## 2015-10-23 DIAGNOSIS — M549 Dorsalgia, unspecified: Secondary | ICD-10-CM | POA: Diagnosis not present

## 2015-10-23 DIAGNOSIS — C792 Secondary malignant neoplasm of skin: Secondary | ICD-10-CM

## 2015-10-23 DIAGNOSIS — C3412 Malignant neoplasm of upper lobe, left bronchus or lung: Secondary | ICD-10-CM

## 2015-10-23 DIAGNOSIS — Z87891 Personal history of nicotine dependence: Secondary | ICD-10-CM | POA: Insufficient documentation

## 2015-10-23 DIAGNOSIS — Z5112 Encounter for antineoplastic immunotherapy: Secondary | ICD-10-CM | POA: Insufficient documentation

## 2015-10-23 DIAGNOSIS — J90 Pleural effusion, not elsewhere classified: Secondary | ICD-10-CM | POA: Diagnosis not present

## 2015-10-23 DIAGNOSIS — R229 Localized swelling, mass and lump, unspecified: Secondary | ICD-10-CM

## 2015-10-23 DIAGNOSIS — Z7952 Long term (current) use of systemic steroids: Secondary | ICD-10-CM | POA: Insufficient documentation

## 2015-10-23 DIAGNOSIS — R490 Dysphonia: Secondary | ICD-10-CM | POA: Diagnosis not present

## 2015-10-23 DIAGNOSIS — R350 Frequency of micturition: Secondary | ICD-10-CM

## 2015-10-23 LAB — CBC WITH DIFFERENTIAL/PLATELET
BASOS ABS: 0.1 10*3/uL (ref 0–0.1)
BASOS PCT: 1 %
EOS ABS: 0.1 10*3/uL (ref 0–0.7)
EOS PCT: 1 %
HCT: 44.4 % (ref 40.0–52.0)
Hemoglobin: 14.6 g/dL (ref 13.0–18.0)
Lymphocytes Relative: 10 %
Lymphs Abs: 0.6 10*3/uL — ABNORMAL LOW (ref 1.0–3.6)
MCH: 27.2 pg (ref 26.0–34.0)
MCHC: 32.9 g/dL (ref 32.0–36.0)
MCV: 82.8 fL (ref 80.0–100.0)
MONO ABS: 0.5 10*3/uL (ref 0.2–1.0)
Monocytes Relative: 10 %
Neutro Abs: 4.3 10*3/uL (ref 1.4–6.5)
Neutrophils Relative %: 78 %
PLATELETS: 221 10*3/uL (ref 150–440)
RBC: 5.36 MIL/uL (ref 4.40–5.90)
RDW: 15.2 % — AB (ref 11.5–14.5)
WBC: 5.5 10*3/uL (ref 3.8–10.6)

## 2015-10-23 LAB — PROTIME-INR
INR: 1.13
PROTHROMBIN TIME: 14.7 s (ref 11.4–15.0)

## 2015-10-23 NOTE — Progress Notes (Signed)
Patient states appetite is better and he is feeling much better.

## 2015-10-23 NOTE — Progress Notes (Signed)
[name] [phone]  Fax:(336) [place]: 1935-01-15  MR#: [id number]  OZH#:[id number]  Patient Care Team: Pcp Not In System as PCP - General Carloyn Manner, MD as Referring Physician (Otolaryngology)  CHIEF COMPLAINT: Oncology history Abnormal CT scan of the chest (November, 2016) Irregular 3.2 x 2.5 left upper lobe lung mass.  Ipsilateral peribronchial and ipsilateral hilar lymphadenopathy.  Right upper lobe supper solid pulmonary nodule.  Multiple pulmonary nodules stress pleural effusion./.  2.  Biopsy of abdominal wall nodule was poorly differentiated   Carcinoma  . CK 7 positive,\TTF negative. Clinically consistent with lung primary  PET scan shows multiple bone metastases T2 N2 M1 B stage IV disease metastases to the bone and abdominal wall Chief Complaint  Patient presents with  . Lung Cancer    VISIT DIAGNOSIS:   No diagnosis found.    No history exists.    Oncology Flowsheet 05/05/2012 12/26/2014 10/02/2015 10/08/2015 10/16/2015 10/16/2015 10/17/2015  Day, Cycle - - - Day 1, Cycle 1 - - -  CARBOplatin (PARAPLATIN) IV - - - 330 mg - - -  denosumab (XGEVA) [place] - - - 120 mg - - -  dexamethasone (DECADRON) IV - - [ 10 mg ] [ 12 mg ] - - -  fosaprepitant (EMEND) IV - - - [ 150 mg ] - - -  ondansetron (ZOFRAN) IV 4 mg 4 mg [ 8 mg ] - 8 mg 8 mg 8 mg  PACLitaxel (TAXOL) IV - - - 100 mg/m2 - - -  palonosetron (ALOXI) IV - - - 0.25 mg - - -    INTERVAL HISTORY:  81 year old gentleman started having discomfort in the chest.  [facility] by [facility].  Multiple admission.  Patient has a history of atrial fibrillation underwent several times for defibrillation.  Without much success on chronic Coumadin therapy.  Patient continues to complain of dry hacking cough.  ,hoarseness of voice. Patient was evaluated by ENT surgeon  Dr. 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 CT scan was ordered which revealed a lung mass and PET scan revealed multiple bone metastases.  Patient was referred to me for further evaluation and treatment consideration  Patient was admitted in the hospital all the hospital records have been reviewed.  With persistent nausea and vomiting Patient is here for further follow-up overall feeling better.  Nausea vomiting is improved.  Bony pains are improving.  Abdominal wall nodule S decrease    REVIEW OF SYSTEMS:   Gen. status: he is feeling much stronger.  not able to sleep because of frequency of passing urine already on Flomax HEENT: Hoarseness of voice. Lungs: Shortness of breath.  Dry hacking cough.  Patient has a squeezing pain in both chest wall area. GI: NAUSEA and vomiting is improved.  Appetite is getting better. GU: No dysuria hematuria, as to get up every 2 hours to pass urine on Flomax Skin: No rash Neurological system no headache no dizziness.  No other focal symptoms Musculoskeletal system back pain. Patient is not taking medication round-the-clock for pain control. Lower extremity intermittent swelling Cardiac history of atrial fibrillation on chronic Coumadin therapy Psychiatric system: No evidence of abnormality  As per HPI. Otherwise, a complete review of systems is negatve.  PAST MEDICAL HISTORY: Past Medical History  Diagnosis Date  . Hypertension   . Coronary artery disease   . Atrial fibrillation ([place])  07/2015  . Primary cancer of left upper lobe of lung ([place]) 10/06/2015    PAST SURGICAL HISTORY: Past Surgical History  Procedure Laterality Date  . Coronary artery bypass graft  12/10/2005    LIMA to LAD,SVG to diagonal,SVG to ramus intermediate,SVG to distal RCA.  . Tonsillectomy    . Tee without cardioversion  05/10/2012    Procedure: TRANSESOPHAGEAL ECHOCARDIOGRAM (TEE);  Surgeon: [name], MD;  Location: [facility] ENDOSCOPY;  Service: Cardiovascular;  Laterality: N/A;  .  Cardioversion  05/10/2012    Procedure: CARDIOVERSION;  Surgeon: [name], MD;  Location: [facility] ENDOSCOPY;  Service: Cardiovascular;  Laterality: N/A;  . Cardiac catheterization  10/28/2005    significant 3 vessel CAD  . Nm myocar perf wall motion  09/16/2005    evidence of diaphragmatic attenuation & subtle anterolateral ischemia.  . Cardioversion N/A 07/31/2015    Procedure: CARDIOVERSION;  Surgeon: Josue Hector, MD;  Location: [facility] ENDOSCOPY;  Service: Cardiovascular;  Laterality: N/A;  . Peripheral vascular catheterization N/A 10/18/2015    Procedure: Glori Luis Cath Insertion;  Surgeon: Algernon Huxley, MD;  Location: [place] CV LAB;  Service: Cardiovascular;  Laterality: N/A;    FAMILY HISTORY Family History  Problem Relation Age of Onset  . Stroke Mother 72  . Stroke Father 34  . Heart attack Father 30  . Heart attack Brother 78  . Cancer Sister 23       ADVANCED DIRECTIVES:  Patient does have advance healthcare directive, Patient   does not desire to make any changes  HEALTH MAINTENANCE: Social History  Substance Use Topics  . Smoking status: Former Smoker -- 1.00 packs/day for 40 years    Types: Cigarettes    Quit date: 09/19/2005  . Smokeless tobacco: Never Used  . Alcohol Use: No      Allergies  Allergen Reactions  . Clonidine Derivatives Other (See Comments)    Pt was not aware of a reaction to this medication  . Diltiazem Hcl Er Other (See Comments)    High blood pressure  . Statins Other (See Comments)    Myalgias:  Previously on Crestor,Vytorin,Simvastatin,Lipitor & Pravastatin  . Sulfa Antibiotics Swelling    Current Outpatient Prescriptions  Medication Sig Dispense Refill  . aspirin EC 81 MG tablet Take 81 mg by mouth daily.    . fentaNYL (DURAGESIC - DOSED MCG/HR) 12 MCG/HR Place 1 patch (12.5 mcg total) onto the skin every 3 (three) days. 10 patch 0  . lidocaine-prilocaine (EMLA) cream Apply 1 application topically as needed. 30 g 2  .  lisinopril (PRINIVIL,ZESTRIL) 10 MG tablet Take 1 tablet (10 mg total) by mouth daily. 90 tablet 3  . LORazepam (ATIVAN) 0.5 MG tablet Take 0.5 tablets (0.25 mg total) by mouth 2 (two) times daily. 60 tablet 0  . metoprolol succinate (TOPROL-XL) 50 MG 24 hr tablet Take 1 tablet (50 mg total) by mouth daily. 90 tablet 3  . omeprazole (PRILOSEC) 20 MG capsule Take 1 capsule (20 mg total) by mouth 2 (two) times daily before a meal. 60 capsule 3  . ondansetron (ZOFRAN) 4 MG tablet Take 1 tablet (4 mg total) by mouth every 8 (eight) hours as needed for nausea or vomiting. 60 tablet 1  . oxyCODONE-acetaminophen (PERCOCET/ROXICET) 5-325 MG tablet Take 1-2 tablets by mouth every 4 (four) hours as needed for moderate pain. 30 tablet 0  . predniSONE (DELTASONE) 20 MG tablet Take 1 tablet (20 mg total) by mouth daily with breakfast. 30 tablet 3  .  Tamsulosin HCl (FLOMAX) 0.4 MG CAPS Take 0.4 mg by mouth daily after supper.    . warfarin (COUMADIN) 5 MG tablet 0.5 tablet (2.'5mg'$ ) daily 90 tablet 0   No current facility-administered medications for this visit.    OBJECTIVE: PHYSICAL EXAM: Gen. status: Patient is alert oriented not any acute distress Lymphatic system: Supraclavicular, cervical, axillary, inguinal lymph nodes are not palpable HEENT: No evidence of abnormality Lungs: Emphysematous chest diminished air entry on both sides. Cardiac: Irregular heart sounds GI: Abdomen is soft liver spleen not palpable there is palpable nodule in mid abdominal area 2 cm x 1 cm Lower extremity no edema Abdominal wall nodule has decreased in size Skin: No rash Neurological system: Higher functions within normal limit.  Cranial nodes are intact.  No other focal signs Psychiatric system within normal limit.    Filed Vitals:   10/23/15 1102  BP: 120/82  Pulse: 105  Temp: 96.9 F (36.1 C)     Body mass index is 30.94 kg/(m^2).    ECOG FS:1 - Symptomatic but completely ambulatory  LAB  RESULTS:  Appointment on 10/23/2015  Component Date Value Ref Range Status  . WBC 10/23/2015 5.5  3.8 - 10.6 K/uL Final  . RBC 10/23/2015 5.36  4.40 - 5.90 MIL/uL Final  . Hemoglobin 10/23/2015 14.6  13.0 - 18.0 g/dL Final  . HCT 10/23/2015 44.4  40.0 - 52.0 % Final  . MCV 10/23/2015 82.8  80.0 - 100.0 fL Final  . MCH 10/23/2015 27.2  26.0 - 34.0 pg Final  . MCHC 10/23/2015 32.9  32.0 - 36.0 g/dL Final  . RDW 10/23/2015 15.2* 11.5 - 14.5 % Final  . Platelets 10/23/2015 221  150 - 440 K/uL Final  . Neutrophils Relative % 10/23/2015 78   Final  . Neutro Abs 10/23/2015 4.3  1.4 - 6.5 K/uL Final  . Lymphocytes Relative 10/23/2015 10   Final  . Lymphs Abs 10/23/2015 0.6* 1.0 - 3.6 K/uL Final  . Monocytes Relative 10/23/2015 10   Final  . Monocytes Absolute 10/23/2015 0.5  0.2 - 1.0 K/uL Final  . Eosinophils Relative 10/23/2015 1   Final  . Eosinophils Absolute 10/23/2015 0.1  0 - 0.7 K/uL Final  . Basophils Relative 10/23/2015 1   Final  . Basophils Absolute 10/23/2015 0.1  0 - 0.1 K/uL Final  . Prothrombin Time 10/23/2015 14.7  11.4 - 15.0 seconds Final  . INR 10/23/2015 1.13   Final     STUDIES: US Biopsy  09/25/2015  INDICATION: History of presumed metastatic lung cancer. Please perform ultrasound-guided biopsy of hypermetabolic subcutaneous nodule within the ventral mid lateral abdominal wall for tissue diagnostic purposes EXAM: ULTRASOUND GUIDED SUBCUTANEOUS NODULE BIOPSY COMPARISON:  PET-CT - 09/19/2015 MEDICATIONS: None ANESTHESIA/SEDATION: None COMPLICATIONS: None immediate PROCEDURE: Informed written consent was obtained from the patient after a discussion of the risks, benefits and alternatives to treatment. The patient understands and consents the procedure. A timeout was performed prior to the initiation of the procedure. Ultrasound scanning was performed of the left mid lateral ventral abdominal wall demonstrates an approximately 1.1 x 1.1 by 1.5 cm mixed echogenic nodule within  the subcutaneous tissues which correlates with the hypermetabolic nodule seen on preceding PET-CT (representative images 3 and 7). The procedure was planned. The skin overlying the ventral left mid lateral abdomen was prepped and draped in the usual sterile fashion. The overlying soft tissues were anesthetized with 1% lidocaine with epinephrine. This was followed by 6 core biopsies with an 18 gauge  core device under direct ultrasound guidance. Multiple ultrasound images were saved for procedural documentation purposes. Superficial hemostasis was obtained with manual compression. Post procedural scanning was negative for definitive area of hemorrhage or additional complication. A dressing was placed. The patient tolerated the procedure well without immediate post procedural complication. IMPRESSION: Technically successful ultrasound guided core needle biopsy of indeterminate hypermetabolic nodule within the subcutaneous tissues about the left mid lateral ventral abdominal wall. Electronically Signed   By: Sandi Mariscal M.D.   On: 09/25/2015 16:05    ASSESSMENT: CT scan of the chest and PET scan has been reviewed independently and reviewed with the patient. Clinical suspicion is carcinoma of lung which is T2 N2 M1 disease based on PET scan. Biopsies consistent with poorly differentiated carcinoma  All lab data has been reviewed hospital admission has been reviewed records have been reviewed.  There is significant relief in nausea vomiting as well as pain after the first cycle of chemotherapy.  Day 8 chemotherapy was held because of admission in the hospital.  Patient will be evaluated next week for continuation of chemotherapy l       Patient expressed understanding and was in agreement with this plan. He also understands that He can call clinic at any time with any questions, concerns, or complaints.    No matching staging information was found for the patient.  Forest Gleason, MD   10/23/2015 11:24  AM

## 2015-10-25 ENCOUNTER — Encounter: Payer: Self-pay | Admitting: Oncology

## 2015-10-29 ENCOUNTER — Encounter: Payer: Self-pay | Admitting: Oncology

## 2015-10-29 ENCOUNTER — Inpatient Hospital Stay: Payer: Medicare Other | Admitting: Oncology

## 2015-10-29 ENCOUNTER — Other Ambulatory Visit: Payer: Self-pay | Admitting: Oncology

## 2015-10-29 ENCOUNTER — Inpatient Hospital Stay: Payer: Medicare Other

## 2015-10-29 ENCOUNTER — Inpatient Hospital Stay
Admission: RE | Admit: 2015-10-29 | Discharge: 2015-10-29 | Disposition: A | Discharge status: Home / Self Care | Payer: Self-pay | Source: Ambulatory Visit | Attending: Oncology | Admitting: Oncology

## 2015-10-29 DIAGNOSIS — C3490 Malignant neoplasm of unspecified part of unspecified bronchus or lung: Secondary | ICD-10-CM

## 2015-11-02 ENCOUNTER — Inpatient Hospital Stay: Payer: Medicare Other

## 2015-11-02 ENCOUNTER — Encounter: Payer: Self-pay | Admitting: Oncology

## 2015-11-02 ENCOUNTER — Inpatient Hospital Stay (HOSPITAL_BASED_OUTPATIENT_CLINIC_OR_DEPARTMENT_OTHER): Payer: Medicare Other | Admitting: Oncology

## 2015-11-02 ENCOUNTER — Telehealth: Payer: Self-pay

## 2015-11-02 VITALS — BP 118/81 | HR 118 | Temp 96.6°F | Wt 187.6 lb

## 2015-11-02 DIAGNOSIS — J069 Acute upper respiratory infection, unspecified: Secondary | ICD-10-CM

## 2015-11-02 DIAGNOSIS — J3801 Paralysis of vocal cords and larynx, unilateral: Secondary | ICD-10-CM

## 2015-11-02 DIAGNOSIS — I4891 Unspecified atrial fibrillation: Secondary | ICD-10-CM

## 2015-11-02 DIAGNOSIS — C7951 Secondary malignant neoplasm of bone: Secondary | ICD-10-CM | POA: Diagnosis not present

## 2015-11-02 DIAGNOSIS — M549 Dorsalgia, unspecified: Secondary | ICD-10-CM

## 2015-11-02 DIAGNOSIS — R531 Weakness: Secondary | ICD-10-CM

## 2015-11-02 DIAGNOSIS — C3412 Malignant neoplasm of upper lobe, left bronchus or lung: Secondary | ICD-10-CM

## 2015-11-02 DIAGNOSIS — I25810 Atherosclerosis of coronary artery bypass graft(s) without angina pectoris: Secondary | ICD-10-CM

## 2015-11-02 DIAGNOSIS — J209 Acute bronchitis, unspecified: Secondary | ICD-10-CM

## 2015-11-02 DIAGNOSIS — C792 Secondary malignant neoplasm of skin: Secondary | ICD-10-CM | POA: Diagnosis not present

## 2015-11-02 DIAGNOSIS — B370 Candidal stomatitis: Secondary | ICD-10-CM

## 2015-11-02 DIAGNOSIS — Z5111 Encounter for antineoplastic chemotherapy: Secondary | ICD-10-CM | POA: Diagnosis not present

## 2015-11-02 DIAGNOSIS — Z7901 Long term (current) use of anticoagulants: Secondary | ICD-10-CM

## 2015-11-02 DIAGNOSIS — J90 Pleural effusion, not elsewhere classified: Secondary | ICD-10-CM | POA: Diagnosis not present

## 2015-11-02 DIAGNOSIS — R918 Other nonspecific abnormal finding of lung field: Secondary | ICD-10-CM

## 2015-11-02 DIAGNOSIS — Z87891 Personal history of nicotine dependence: Secondary | ICD-10-CM

## 2015-11-02 DIAGNOSIS — R229 Localized swelling, mass and lump, unspecified: Secondary | ICD-10-CM

## 2015-11-02 DIAGNOSIS — R350 Frequency of micturition: Secondary | ICD-10-CM

## 2015-11-02 LAB — COMPREHENSIVE METABOLIC PANEL
ALK PHOS: 113 U/L (ref 38–126)
ALT: 49 U/L (ref 17–63)
AST: 21 U/L (ref 15–41)
Albumin: 3 g/dL — ABNORMAL LOW (ref 3.5–5.0)
Anion gap: 7 (ref 5–15)
BILIRUBIN TOTAL: 0.9 mg/dL (ref 0.3–1.2)
BUN: 42 mg/dL — AB (ref 6–20)
CHLORIDE: 105 mmol/L (ref 101–111)
CO2: 22 mmol/L (ref 22–32)
CREATININE: 1.5 mg/dL — AB (ref 0.61–1.24)
Calcium: 7.9 mg/dL — ABNORMAL LOW (ref 8.9–10.3)
GFR calc Af Amer: 49 mL/min — ABNORMAL LOW (ref >60)
GFR calc non Af Amer: 43 mL/min — ABNORMAL LOW (ref >60)
GLUCOSE: 123 mg/dL — AB (ref 65–99)
POTASSIUM: 4.3 mmol/L (ref 3.5–5.1)
SODIUM: 134 mmol/L — AB (ref 135–145)
TOTAL PROTEIN: 6.6 g/dL (ref 6.5–8.1)

## 2015-11-02 LAB — CBC WITH DIFFERENTIAL/PLATELET
Basophils Absolute: 0.1 10*3/uL (ref 0–0.1)
Basophils Relative: 1 %
EOS ABS: 0.1 10*3/uL (ref 0–0.7)
EOS PCT: 1 %
HCT: 42.5 % (ref 40.0–52.0)
HEMOGLOBIN: 14 g/dL (ref 13.0–18.0)
LYMPHS ABS: 0.6 10*3/uL — AB (ref 1.0–3.6)
Lymphocytes Relative: 6 %
MCH: 26.8 pg (ref 26.0–34.0)
MCHC: 32.9 g/dL (ref 32.0–36.0)
MCV: 81.7 fL (ref 80.0–100.0)
MONOS PCT: 4 %
Monocytes Absolute: 0.5 10*3/uL (ref 0.2–1.0)
Neutro Abs: 10.5 10*3/uL — ABNORMAL HIGH (ref 1.4–6.5)
Neutrophils Relative %: 88 %
PLATELETS: 185 10*3/uL (ref 150–440)
RBC: 5.21 MIL/uL (ref 4.40–5.90)
RDW: 15.7 % — ABNORMAL HIGH (ref 11.5–14.5)
WBC: 11.8 10*3/uL — ABNORMAL HIGH (ref 3.8–10.6)

## 2015-11-02 LAB — MAGNESIUM: Magnesium: 2.2 mg/dL (ref 1.7–2.4)

## 2015-11-02 LAB — SURGICAL PATHOLOGY

## 2015-11-02 MED ORDER — HYDROCODONE-ACETAMINOPHEN 5-325 MG PO TABS: 1.0000 | ORAL_TABLET | Freq: Four times a day (QID) | ORAL | Status: DC | PRN

## 2015-11-02 MED ORDER — SODIUM CHLORIDE 0.9 % IV SOLN
Freq: Once | INTRAVENOUS | Status: AC
  Administered 2015-11-02: 11:00:00 via INTRAVENOUS
  Filled 2015-11-02: qty 1000

## 2015-11-02 MED ORDER — HEPARIN SOD (PORK) LOCK FLUSH 100 UNIT/ML IV SOLN
500.0000 [IU] | Freq: Once | INTRAVENOUS | Status: AC
  Administered 2015-11-02: 500 [IU] via INTRAVENOUS
  Filled 2015-11-02: qty 5

## 2015-11-02 MED ORDER — DEXAMETHASONE SODIUM PHOSPHATE 100 MG/10ML IJ SOLN
Freq: Once | INTRAMUSCULAR | Status: AC
  Administered 2015-11-02: 11:00:00 via INTRAVENOUS
  Filled 2015-11-02: qty 8

## 2015-11-02 MED ORDER — PACLITAXEL CHEMO INJECTION 300 MG/50ML
204.0000 mg | Freq: Once | INTRAVENOUS | Status: AC
  Administered 2015-11-02: 204 mg via INTRAVENOUS
  Filled 2015-11-02: qty 34

## 2015-11-02 MED ORDER — AMOXICILLIN-POT CLAVULANATE 875-125 MG PO TABS: 1.0000 | ORAL_TABLET | Freq: Two times a day (BID) | ORAL | Status: DC

## 2015-11-02 MED ORDER — GUAIFENESIN ER 600 MG PO TB12: 600.0000 mg | ORAL_TABLET | Freq: Two times a day (BID) | ORAL | Status: DC

## 2015-11-02 MED ORDER — DIPHENHYDRAMINE HCL 50 MG/ML IJ SOLN
50.0000 mg | Freq: Once | INTRAMUSCULAR | Status: AC
  Administered 2015-11-02: 50 mg via INTRAVENOUS
  Filled 2015-11-02: qty 1

## 2015-11-02 MED ORDER — FAMOTIDINE IN NACL 20-0.9 MG/50ML-% IV SOLN
20.0000 mg | Freq: Once | INTRAVENOUS | Status: AC
  Administered 2015-11-02: 20 mg via INTRAVENOUS
  Filled 2015-11-02: qty 50

## 2015-11-02 MED ORDER — SODIUM CHLORIDE 0.9 % IJ SOLN
10.0000 mL | INTRAMUSCULAR | Status: DC | PRN
  Administered 2015-11-02: 10 mL via INTRAVENOUS
  Filled 2015-11-02: qty 10

## 2015-11-02 MED ORDER — DENOSUMAB 120 MG/1.7ML ~~LOC~~ SOLN
120.0000 mg | Freq: Once | SUBCUTANEOUS | Status: AC
Start: 2015-11-02 — End: 2015-11-02
  Administered 2015-11-02: 120 mg via SUBCUTANEOUS
  Filled 2015-11-02: qty 1.7

## 2015-11-02 MED ORDER — SODIUM CHLORIDE 0.9 % IV SOLN
365.5000 mg | Freq: Once | INTRAVENOUS | Status: AC
  Administered 2015-11-02: 370 mg via INTRAVENOUS
  Filled 2015-11-02: qty 37

## 2015-11-02 NOTE — Telephone Encounter (Signed)
Calcium =7.9, albumin =3. Corrected calcium =8.7

## 2015-11-02 NOTE — Progress Notes (Signed)
Patient states he coughs up green sputum.  Wife states he sounds like he is choking to death.  Patient also requesting refill for Percocet.

## 2015-11-02 NOTE — Progress Notes (Signed)
[place] @ [facility] Telephone:(336) [phone]  Fax:(336) [place]: 1935/08/06  MR#: [id number]  GQQ#:[id number]  Patient Care Team: Pcp Not In System as PCP - General Carloyn Manner, MD as Referring Physician (Otolaryngology)  CHIEF COMPLAINT: Oncology history Abnormal CT scan of the chest (November, 2016) Irregular 3.2 x 2.5 left upper lobe lung mass.  Ipsilateral peribronchial and ipsilateral hilar lymphadenopathy.  Right upper lobe supper solid pulmonary nodule.  Multiple pulmonary nodules stress pleural effusion./.  2.  Biopsy of abdominal wall nodule was poorly differentiated   Carcinoma  . CK 7 positive,\TTF negative. Clinically consistent with lung primary  PET scan shows multiple bone metastases T2 N2 M1 B stage IV disease metastases to the bone and abdominal wall.  3.  PDL 10 was more than 50% Chief Complaint  Patient presents with  . Lung Cancer    VISIT DIAGNOSIS:   No diagnosis found.    No history exists.    Oncology Flowsheet 05/05/2012 12/26/2014 10/02/2015 10/08/2015 10/16/2015 10/16/2015 10/17/2015  Day, Cycle - - - Day 1, Cycle 1 - - -  CARBOplatin (PARAPLATIN) IV - - - 330 mg - - -  denosumab (XGEVA) [place] - - - 120 mg - - -  dexamethasone (DECADRON) IV - - [ 10 mg ] [ 12 mg ] - - -  fosaprepitant (EMEND) IV - - - [ 150 mg ] - - -  ondansetron (ZOFRAN) IV 4 mg 4 mg [ 8 mg ] - 8 mg 8 mg 8 mg  PACLitaxel (TAXOL) IV - - - 100 mg/m2 - - -  palonosetron (ALOXI) IV - - - 0.25 mg - - -    INTERVAL HISTORY:  81 year old gentleman started having discomfort in the chest.  Patient went to emergency room in [facility] followed by [facility].  Multiple admission.  Patient has a history of atrial fibrillation underwent several times for defibrillation.  Without much success on chronic Coumadin therapy.  Patient continues to complain of dry hacking cough.  ,hoarseness of voice. Patient was evaluated by ENT surgeon   Dr. Pryor Ochoa and the left vocal cord paralysis was found. CT scan was ordered which revealed a lung mass and PET scan revealed multiple bone metastases.  Patient was referred to me for further evaluation and treatment consideration  [name] is here for further follow-up and treatment consideration.  Patient complains of cough yellowish and greenish expectoration.  Patient missed day 8 chemotherapy and the next cycle of chemotherapy was delayed because of increasing mental weather.  Congestion in the lung.  Abdominal wall nodule has decreased in size    REVIEW OF SYSTEMS:   Gen. status: he is feeling much stronger.  not able to sleep because of frequency of passing urine already on Flomax HEENT: Hoarseness of voice.  Her cyst Lungs: Shortness of breath.  Dry hacking cough.  Patient has a squeezing pain in both chest wall area.  Patient complains of cough and greenish expectoration and congestion GI: NAUSEA and vomiting is improved.  Appetite is getting better. GU: No dysuria hematuria, as to get up every 2 hours to pass urine on Flomax Skin: No rash Neurological system no headache no dizziness.  No other focal symptoms Musculoskeletal system back pain. Patient is not taking medication round-the-clock for pain control. Lower extremity intermittent swelling Cardiac history of atrial fibrillation on chronic Coumadin therapy Psychiatric system: No evidence of abnormality  As per HPI. Otherwise, a complete review of  systems is negatve.  PAST MEDICAL HISTORY: Past Medical History  Diagnosis Date  . Hypertension   . Coronary artery disease   . Atrial fibrillation ([place]) 07/2015  . Primary cancer of left upper lobe of lung ([place]) 10/06/2015    PAST SURGICAL HISTORY: Past Surgical History  Procedure Laterality Date  . Coronary artery bypass graft  12/10/2005    LIMA to LAD,SVG to diagonal,SVG to ramus intermediate,SVG to distal RCA.  . Tonsillectomy    . Tee without cardioversion  05/10/2012     Procedure: TRANSESOPHAGEAL ECHOCARDIOGRAM (TEE);  Surgeon: [name], MD;  Location: [facility] ENDOSCOPY;  Service: Cardiovascular;  Laterality: N/A;  . Cardioversion  05/10/2012    Procedure: CARDIOVERSION;  Surgeon: [name], MD;  Location: [facility] ENDOSCOPY;  Service: Cardiovascular;  Laterality: N/A;  . Cardiac catheterization  10/28/2005    significant 3 vessel CAD  . Nm myocar perf wall motion  09/16/2005    evidence of diaphragmatic attenuation & subtle anterolateral ischemia.  . Cardioversion N/A 07/31/2015    Procedure: CARDIOVERSION;  Surgeon: Josue Hector, MD;  Location: [facility] ENDOSCOPY;  Service: Cardiovascular;  Laterality: N/A;  . Peripheral vascular catheterization N/A 10/18/2015    Procedure: Glori Luis Cath Insertion;  Surgeon: Algernon Huxley, MD;  Location: [place] CV LAB;  Service: Cardiovascular;  Laterality: N/A;    FAMILY HISTORY Family History  Problem Relation Age of Onset  . Stroke Mother 34  . Stroke Father 78  . Heart attack Father 35  . Heart attack Brother 72  . Cancer Sister 25       ADVANCED DIRECTIVES:  Patient does have advance healthcare directive, Patient   does not desire to make any changes  HEALTH MAINTENANCE: Social History  Substance Use Topics  . Smoking status: Former Smoker -- 1.00 packs/day for 40 years    Types: Cigarettes    Quit date: 09/19/2005  . Smokeless tobacco: Never Used  . Alcohol Use: No      Allergies  Allergen Reactions  . Clonidine Derivatives Other (See Comments)    Pt was not aware of a reaction to this medication  . Diltiazem Hcl Er Other (See Comments)    High blood pressure  . Statins Other (See Comments)    Myalgias:  Previously on Crestor,Vytorin,Simvastatin,Lipitor & Pravastatin  . Sulfa Antibiotics Swelling    Current Outpatient Prescriptions  Medication Sig Dispense Refill  . aspirin EC 81 MG tablet Take 81 mg by mouth daily.    . fentaNYL (DURAGESIC - DOSED MCG/HR) 12 MCG/HR Place 1 patch (12.5  mcg total) onto the skin every 3 (three) days. 10 patch 0  . lidocaine-prilocaine (EMLA) cream Apply 1 application topically as needed. 30 g 2  . lisinopril (PRINIVIL,ZESTRIL) 10 MG tablet Take 1 tablet (10 mg total) by mouth daily. 90 tablet 3  . LORazepam (ATIVAN) 0.5 MG tablet Take 0.5 tablets (0.25 mg total) by mouth 2 (two) times daily. 60 tablet 0  . metoprolol succinate (TOPROL-XL) 50 MG 24 hr tablet Take 1 tablet (50 mg total) by mouth daily. 90 tablet 3  . omeprazole (PRILOSEC) 20 MG capsule Take 1 capsule (20 mg total) by mouth 2 (two) times daily before a meal. 60 capsule 3  . ondansetron (ZOFRAN) 4 MG tablet Take 1 tablet (4 mg total) by mouth every 8 (eight) hours as needed for nausea or vomiting. 60 tablet 1  . oxyCODONE-acetaminophen (PERCOCET/ROXICET) 5-325 MG tablet Take 1-2 tablets by mouth every 4 (four) hours as  needed for moderate pain. 30 tablet 0  . predniSONE (DELTASONE) 20 MG tablet Take 1 tablet (20 mg total) by mouth daily with breakfast. 30 tablet 3  . Tamsulosin HCl (FLOMAX) 0.4 MG CAPS Take 0.4 mg by mouth daily after supper.    . warfarin (COUMADIN) 5 MG tablet 0.5 tablet (2.'5mg'$ ) daily 90 tablet 0   No current facility-administered medications for this visit.   Facility-Administered Medications Ordered in Other Visits  Medication Dose Route Frequency Provider Last Rate Last Dose  . heparin lock flush 100 unit/mL  500 Units Intravenous Once Forest Gleason, MD      . sodium chloride 0.9 % injection 10 mL  10 mL Intravenous PRN Forest Gleason, MD   10 mL at 11/02/15 7517    OBJECTIVE: PHYSICAL EXAM: Gen. status: Patient is alert oriented not any acute distress Lymphatic system: Supraclavicular, cervical, axillary, inguinal lymph nodes are not palpable HEENT: No evidence of abnormality Lungs: Emphysematous chest diminished air entry on both sides. Cardiac: Irregular heart sounds GI: Abdomen is soft liver spleen not palpable there is palpable nodule in mid abdominal  area 2 cm x 1 cm Abdominal nodule is decrease in size Lower extremity no edema Abdominal wall nodule has decreased in size Skin: No rash Neurological system: Higher functions within normal limit.  Cranial nodes are intact.  No other focal signs Psychiatric system within normal limit.    Filed Vitals:   11/02/15 0950  BP: 118/81  Pulse: 118  Temp: 96.6 F (35.9 C)     Body mass index is 30.3 kg/(m^2).    ECOG FS:1 - Symptomatic but completely ambulatory  LAB RESULTS:  Infusion on 11/02/2015  Component Date Value Ref Range Status  . WBC 11/02/2015 11.8* 3.8 - 10.6 K/uL Final  . RBC 11/02/2015 5.21  4.40 - 5.90 MIL/uL Final  . Hemoglobin 11/02/2015 14.0  13.0 - 18.0 g/dL Final  . HCT 11/02/2015 42.5  40.0 - 52.0 % Final  . MCV 11/02/2015 81.7  80.0 - 100.0 fL Final  . MCH 11/02/2015 26.8  26.0 - 34.0 pg Final  . MCHC 11/02/2015 32.9  32.0 - 36.0 g/dL Final  . RDW 11/02/2015 15.7* 11.5 - 14.5 % Final  . Platelets 11/02/2015 185  150 - 440 K/uL Final  . Neutrophils Relative % 11/02/2015 88   Final  . Neutro Abs 11/02/2015 10.5* 1.4 - 6.5 K/uL Final  . Lymphocytes Relative 11/02/2015 6   Final  . Lymphs Abs 11/02/2015 0.6* 1.0 - 3.6 K/uL Final  . Monocytes Relative 11/02/2015 4   Final  . Monocytes Absolute 11/02/2015 0.5  0.2 - 1.0 K/uL Final  . Eosinophils Relative 11/02/2015 1   Final  . Eosinophils Absolute 11/02/2015 0.1  0 - 0.7 K/uL Final  . Basophils Relative 11/02/2015 1   Final  . Basophils Absolute 11/02/2015 0.1  0 - 0.1 K/uL Final  . Sodium 11/02/2015 134* 135 - 145 mmol/L Final  . Potassium 11/02/2015 4.3  3.5 - 5.1 mmol/L Final  . Chloride 11/02/2015 105  101 - 111 mmol/L Final  . CO2 11/02/2015 22  22 - 32 mmol/L Final  . Glucose, Bld 11/02/2015 123* 65 - 99 mg/dL Final  . BUN 11/02/2015 42* 6 - 20 mg/dL Final  . Creatinine, Ser 11/02/2015 1.50* 0.61 - 1.24 mg/dL Final  . Calcium 11/02/2015 7.9* 8.9 - 10.3 mg/dL Final  . Total Protein 11/02/2015 6.6  6.5 -  8.1 g/dL Final  . Albumin 11/02/2015 3.0* 3.5 -  5.0 g/dL Final  . AST 11/02/2015 21  15 - 41 U/L Final  . ALT 11/02/2015 49  17 - 63 U/L Final  . Alkaline Phosphatase 11/02/2015 113  38 - 126 U/L Final  . Total Bilirubin 11/02/2015 0.9  0.3 - 1.2 mg/dL Final  . GFR calc non Af Amer 11/02/2015 43* >60 mL/min Final  . GFR calc Af Amer 11/02/2015 49* >60 mL/min Final   Comment: (NOTE) The eGFR has been calculated using the CKD EPI equation. This calculation has not been validated in all clinical situations. eGFR's persistently <60 mL/min signify possible Chronic Kidney Disease.   . Anion gap 11/02/2015 7  5 - 15 Final  . Magnesium 11/02/2015 2.2  1.7 - 2.4 mg/dL Final     STUDIES: Mr Outside Films Head/face  10/29/2015  This examination belongs to an outside facility and is stored here for comparison purposes only.  Contact the originating outside institution for any associated report or interpretation.   ASSESSMENT: CT scan of the chest and PET scan has been reviewed independently and reviewed with the patient. Clinical suspicion is carcinoma of lung which is T2 N2 M1 disease based on PET scan. Biopsies consistent with poorly differentiated carcinoma  We will continue second cycle of chemotherapy.  I discussed consideration off anti-PDL 1 drug like KEYTRUDA But initial chemotherapy response is very good and we need creek response considering patient multiple bone metastases.  Calcium is 7.9 and we will proceed with XGEVA. We will add calcium by mouth 2.  Acute bronchitis greenish sputum. Patient will be started on Augmentin 875 mg every 12 hourly.  Humibid LA. 3.  Back pain is getting better.  Patient does not want Percocet so Vicodin would be given for breakthrough pain Reevaluate patient for day 8 Taxol chemotherapy and depending on the response will proceed with KEYTRUDA down the road.       Patient expressed understanding and was in agreement with this plan. He also  understands that He can call clinic at any time with any questions, concerns, or complaints.    No matching staging information was found for the patient.  Forest Gleason, MD   11/02/2015 10:16 AM

## 2015-11-05 ENCOUNTER — Encounter: Payer: Self-pay | Admitting: Oncology

## 2015-11-07 ENCOUNTER — Ambulatory Visit: Payer: Medicare Other | Admitting: Oncology

## 2015-11-07 ENCOUNTER — Other Ambulatory Visit: Payer: Medicare Other

## 2015-11-07 ENCOUNTER — Ambulatory Visit: Payer: Medicare Other

## 2015-11-08 ENCOUNTER — Encounter: Payer: Self-pay | Admitting: Oncology

## 2015-11-08 ENCOUNTER — Inpatient Hospital Stay (HOSPITAL_BASED_OUTPATIENT_CLINIC_OR_DEPARTMENT_OTHER): Payer: Medicare Other | Admitting: Oncology

## 2015-11-08 ENCOUNTER — Inpatient Hospital Stay: Payer: Medicare Other

## 2015-11-08 VITALS — BP 100/69 | HR 93 | Temp 96.3°F | Resp 18 | Wt 179.3 lb

## 2015-11-08 DIAGNOSIS — J209 Acute bronchitis, unspecified: Secondary | ICD-10-CM

## 2015-11-08 DIAGNOSIS — I4891 Unspecified atrial fibrillation: Secondary | ICD-10-CM

## 2015-11-08 DIAGNOSIS — Z87891 Personal history of nicotine dependence: Secondary | ICD-10-CM

## 2015-11-08 DIAGNOSIS — Z7952 Long term (current) use of systemic steroids: Secondary | ICD-10-CM

## 2015-11-08 DIAGNOSIS — Z7901 Long term (current) use of anticoagulants: Secondary | ICD-10-CM

## 2015-11-08 DIAGNOSIS — C792 Secondary malignant neoplasm of skin: Secondary | ICD-10-CM | POA: Diagnosis not present

## 2015-11-08 DIAGNOSIS — C3412 Malignant neoplasm of upper lobe, left bronchus or lung: Secondary | ICD-10-CM | POA: Diagnosis not present

## 2015-11-08 DIAGNOSIS — Z5111 Encounter for antineoplastic chemotherapy: Secondary | ICD-10-CM | POA: Diagnosis not present

## 2015-11-08 DIAGNOSIS — Z79899 Other long term (current) drug therapy: Secondary | ICD-10-CM

## 2015-11-08 DIAGNOSIS — C7951 Secondary malignant neoplasm of bone: Secondary | ICD-10-CM | POA: Diagnosis not present

## 2015-11-08 DIAGNOSIS — Z7982 Long term (current) use of aspirin: Secondary | ICD-10-CM

## 2015-11-08 DIAGNOSIS — J069 Acute upper respiratory infection, unspecified: Secondary | ICD-10-CM

## 2015-11-08 DIAGNOSIS — J3801 Paralysis of vocal cords and larynx, unilateral: Secondary | ICD-10-CM

## 2015-11-08 DIAGNOSIS — R112 Nausea with vomiting, unspecified: Secondary | ICD-10-CM

## 2015-11-08 DIAGNOSIS — J90 Pleural effusion, not elsewhere classified: Secondary | ICD-10-CM

## 2015-11-08 LAB — CBC WITH DIFFERENTIAL/PLATELET
Basophils Absolute: 0 10*3/uL (ref 0–0.1)
Basophils Relative: 0 %
EOS ABS: 0.1 10*3/uL (ref 0–0.7)
EOS PCT: 1 %
HCT: 44.5 % (ref 40.0–52.0)
Hemoglobin: 14.7 g/dL (ref 13.0–18.0)
LYMPHS ABS: 0.4 10*3/uL — AB (ref 1.0–3.6)
LYMPHS PCT: 5 %
MCH: 26.5 pg (ref 26.0–34.0)
MCHC: 33 g/dL (ref 32.0–36.0)
MCV: 80.4 fL (ref 80.0–100.0)
MONO ABS: 0.1 10*3/uL — AB (ref 0.2–1.0)
Monocytes Relative: 1 %
Neutro Abs: 8.5 10*3/uL — ABNORMAL HIGH (ref 1.4–6.5)
Neutrophils Relative %: 93 %
PLATELETS: 245 10*3/uL (ref 150–440)
RBC: 5.53 MIL/uL (ref 4.40–5.90)
RDW: 15.7 % — AB (ref 11.5–14.5)
WBC: 9.1 10*3/uL (ref 3.8–10.6)

## 2015-11-08 LAB — COMPREHENSIVE METABOLIC PANEL
ALT: 40 U/L (ref 17–63)
ANION GAP: 9 (ref 5–15)
AST: 20 U/L (ref 15–41)
Albumin: 3.1 g/dL — ABNORMAL LOW (ref 3.5–5.0)
Alkaline Phosphatase: 89 U/L (ref 38–126)
BUN: 41 mg/dL — ABNORMAL HIGH (ref 6–20)
CHLORIDE: 102 mmol/L (ref 101–111)
CO2: 21 mmol/L — AB (ref 22–32)
CREATININE: 1.42 mg/dL — AB (ref 0.61–1.24)
Calcium: 7.9 mg/dL — ABNORMAL LOW (ref 8.9–10.3)
GFR, EST AFRICAN AMERICAN: 53 mL/min — AB (ref >60)
GFR, EST NON AFRICAN AMERICAN: 45 mL/min — AB (ref >60)
Glucose, Bld: 162 mg/dL — ABNORMAL HIGH (ref 65–99)
POTASSIUM: 4.3 mmol/L (ref 3.5–5.1)
SODIUM: 132 mmol/L — AB (ref 135–145)
Total Bilirubin: 1.1 mg/dL (ref 0.3–1.2)
Total Protein: 6.7 g/dL (ref 6.5–8.1)

## 2015-11-08 LAB — MAGNESIUM: MAGNESIUM: 2.1 mg/dL (ref 1.7–2.4)

## 2015-11-08 MED ORDER — SODIUM CHLORIDE 0.9 % IJ SOLN
10.0000 mL | Freq: Once | INTRAMUSCULAR | Status: AC
  Administered 2015-11-08: 10 mL via INTRAVENOUS
  Filled 2015-11-08: qty 10

## 2015-11-08 MED ORDER — SODIUM CHLORIDE 0.9 % IV SOLN
Freq: Once | INTRAVENOUS | Status: AC
  Administered 2015-11-08: 11:00:00 via INTRAVENOUS
  Filled 2015-11-08: qty 4

## 2015-11-08 MED ORDER — HEPARIN SOD (PORK) LOCK FLUSH 100 UNIT/ML IV SOLN
500.0000 [IU] | Freq: Once | INTRAVENOUS | Status: AC
  Administered 2015-11-08: 500 [IU] via INTRAVENOUS
  Filled 2015-11-08: qty 5

## 2015-11-08 MED ORDER — SODIUM CHLORIDE 0.45 % IV SOLN
INTRAVENOUS | Status: DC
  Administered 2015-11-08: 11:00:00 via INTRAVENOUS
  Filled 2015-11-08: qty 1000

## 2015-11-08 NOTE — Progress Notes (Signed)
Patient continues to feel weak and nauseated.  Appetite not good.  States he drinks plenty but cannot eat.

## 2015-11-08 NOTE — Progress Notes (Signed)
[place] @ [facility] [phone]  Fax:([phone]) [place]: Aug 21, 1935  MR#: [id number]  UUV#:[id number]  Patient Care Team: Pcp Not In System as PCP - General Carloyn Manner, MD as Referring Physician (Otolaryngology)  CHIEF COMPLAINT: Oncology history Abnormal CT scan of the chest (November, 2016) Irregular 3.2 x 2.5 left upper lobe lung mass.  Ipsilateral peribronchial and ipsilateral hilar lymphadenopathy.  Right upper lobe supper solid pulmonary nodule.  Multiple pulmonary nodules stress pleural effusion./.  2.  Biopsy of abdominal wall nodule was poorly differentiated   Carcinoma  . CK 7 positive,\TTF negative. Clinically consistent with lung primary  PET scan shows multiple bone metastases T2 N2 M1 B stage IV disease metastases to the bone and abdominal wall.  3.  PDL 10 was more than 50%  4.PATIENT WILL BE STARTED ON Beryle Flock FROM jANUARY  25TH, 2016 Chief Complaint  Patient presents with  . Lung Cancer    VISIT DIAGNOSIS:     ICD-9-CM ICD-10-CM   1. Primary cancer of left upper lobe of lung (HCC) 162.3 C34.12 CBC with Differential/Platelet     Magnesium     Comprehensive metabolic panel     DISCONTINUED: 0.45 % sodium chloride infusion     DISCONTINUED: ondansetron (ZOFRAN) 8 mg, dexamethasone (DECADRON) 4 mg in sodium chloride 0.9 % 50 mL IVPB      No history exists.    Oncology Flowsheet 10/02/2015 10/08/2015 10/16/2015 10/16/2015 10/17/2015 11/02/2015 11/08/2015  Day, Cycle - Day 1, Cycle 1 - - - Day 1, Cycle 1 -  CARBOplatin (PARAPLATIN) IV - 330 mg - - - 370 mg -  denosumab (XGEVA) [place] - 120 mg - - - 120 mg -  dexamethasone (DECADRON) IV [ 10 mg ] [ 12 mg ] - - - [ 20 mg ] [ 4 mg ]  fosaprepitant (EMEND) IV - [ 150 mg ] - - - - -  ondansetron (ZOFRAN) IV [ 8 mg ] - 8 mg 8 mg 8 mg [ 16 mg ] [ 8 mg ]  PACLitaxel (TAXOL) IV - 100 mg/m2 - - - 204 mg -  palonosetron (ALOXI) IV - 0.25 mg - - - - -    INTERVAL  HISTORY:  81 year old gentleman started having discomfort in the chest.  Patient went to emergency room in [facility] followed by [facility]  Multiple admission.  Patient has a history of atrial fibrillation underwent several times for defibrillation.  Without much success on chronic Coumadin therapy.  Patient continues to complain of dry hacking cough.  ,hoarseness of voice. Patient was evaluated by ENT surgeon  Dr. Pryor Ochoa and the left vocal cord paralysis was found. CT scan was ordered which revealed a lung mass and PET scan revealed multiple bone metastases.  Patient was referred to me for further evaluation and treatment consideration  Jenny Reichmann is here for further follow-up and treatment consideration.  Patient complains of cough yellowish and greenish expectoration.  Patient missed day 8 chemotherapy and the next cycle of chemotherapy was delayed because of increasing mental weather.  Congestion in the lung.  Abdominal wall nodule has decreased in size PatientCONTINUES TO FEEL EXTREMELY SICK AND NAUSEOUS.  lOSING WEIGHT.Marland Kitchen Here for day 8 chemotherapy but reluctant to pursue further chemotherapy.     REVIEW OF SYSTEMS:   Gen. status: he is feeling much stronger.  not able to sleep because of frequency of passing urine already on Flomax HEENT: Hoarseness of voice.  Her cyst Lungs: Shortness of breath.  Dry hacking cough.  Patient has a squeezing pain in both chest wall area.  Patient complains of cough and greenish expectoration and congestion GI:.  Nausea and vomiting continues  Appetite is getting better. GU: No dysuria hematuria, as to get up every 2 hours to pass urine on Flomax Skin: No rash Neurological system no headache no dizziness.  No other focal symptoms Musculoskeletal system back pain. Patient is not taking medication round-the-clock for pain control. Lower extremity intermittent swelling Cardiac history of atrial fibrillation on chronic Coumadin  therapy Psychiatric system: No evidence of abnormality  As per HPI. Otherwise, a complete review of systems is negatve.  PAST MEDICAL HISTORY: Past Medical History  Diagnosis Date  . Hypertension   . Coronary artery disease   . Atrial fibrillation (HCC) 07/2015  . Primary cancer of left upper lobe of lung (HCC) 10/06/2015    PAST SURGICAL HISTORY: Past Surgical History  Procedure Laterality Date  . Coronary artery bypass graft  12/10/2005    LIMA to LAD,SVG to diagonal,SVG to ramus intermediate,SVG to distal RCA.  . Tonsillectomy    . Tee without cardioversion  05/10/2012    Procedure: TRANSESOPHAGEAL ECHOCARDIOGRAM (TEE);  Surgeon: Chrystie Nose, MD;  Location: [facility] ENDOSCOPY;  Service: Cardiovascular;  Laterality: N/A;  . Cardioversion  05/10/2012    Procedure: CARDIOVERSION;  Surgeon: Chrystie Nose, MD;  Location: [facility] ENDOSCOPY;  Service: Cardiovascular;  Laterality: N/A;  . Cardiac catheterization  10/28/2005    significant 3 vessel CAD  . Nm myocar perf wall motion  09/16/2005    evidence of diaphragmatic attenuation & subtle anterolateral ischemia.  . Cardioversion N/A 07/31/2015    Procedure: CARDIOVERSION;  Surgeon: Wendall Stade, MD;  Location: [name] ENDOSCOPY;  Service: Cardiovascular;  Laterality: N/A;  . Peripheral vascular catheterization N/A 10/18/2015    Procedure: Shelda Pal Cath Insertion;  Surgeon: Annice Needy, MD;  Location: [facility] INVASIVE CV LAB;  Service: Cardiovascular;  Laterality: N/A;    FAMILY HISTORY Family History  Problem Relation Age of Onset  . Stroke Mother 60  . Stroke Father 101  . Heart attack Father 37  . Heart attack Brother 50  . Cancer Sister 72       ADVANCED DIRECTIVES:  Patient does have advance healthcare directive, Patient   does not desire to make any changes  HEALTH MAINTENANCE: Social History  Substance Use Topics  . Smoking status: Former Smoker -- 1.00 packs/day for 40 years    Types: Cigarettes    Quit date: 09/19/2005  .  Smokeless tobacco: Never Used  . Alcohol Use: No      Allergies  Allergen Reactions  . Clonidine Derivatives Other (See Comments)    Pt was not aware of a reaction to this medication  . Diltiazem Hcl Er Other (See Comments)    High blood pressure  . Statins Other (See Comments)    Myalgias:  Previously on Crestor,Vytorin,Simvastatin,Lipitor & Pravastatin  . Sulfa Antibiotics Swelling    Current Outpatient Prescriptions  Medication Sig Dispense Refill  . amoxicillin-clavulanate (AUGMENTIN) 875-125 MG tablet Take 1 tablet by mouth 2 (two) times daily. 14 tablet 0  . aspirin EC 81 MG tablet Take 81 mg by mouth daily.    . fentaNYL (DURAGESIC - DOSED MCG/HR) 12 MCG/HR Place 1 patch (12.5 mcg total) onto the skin every 3 (three) days. 10 patch 0  . guaiFENesin (MUCINEX) 600 MG 12 hr tablet Take 1 tablet (600 mg total)  by mouth 2 (two) times daily. 60 tablet 0  . HYDROcodone-acetaminophen (NORCO/VICODIN) 5-325 MG tablet Take 1 tablet by mouth every 6 (six) hours as needed for moderate pain. 60 tablet 0  . lidocaine-prilocaine (EMLA) cream Apply 1 application topically as needed. 30 g 2  . lisinopril (PRINIVIL,ZESTRIL) 10 MG tablet Take 1 tablet (10 mg total) by mouth daily. 90 tablet 3  . LORazepam (ATIVAN) 0.5 MG tablet Take 0.5 tablets (0.25 mg total) by mouth 2 (two) times daily. 60 tablet 0  . metoprolol succinate (TOPROL-XL) 50 MG 24 hr tablet Take 1 tablet (50 mg total) by mouth daily. 90 tablet 3  . omeprazole (PRILOSEC) 20 MG capsule Take 1 capsule (20 mg total) by mouth 2 (two) times daily before a meal. 60 capsule 3  . ondansetron (ZOFRAN) 4 MG tablet Take 1 tablet (4 mg total) by mouth every 8 (eight) hours as needed for nausea or vomiting. 60 tablet 1  . oxyCODONE-acetaminophen (PERCOCET/ROXICET) 5-325 MG tablet Take 1-2 tablets by mouth every 4 (four) hours as needed for moderate pain. 30 tablet 0  . predniSONE (DELTASONE) 20 MG tablet Take 1 tablet (20 mg total) by mouth daily  with breakfast. 30 tablet 3  . Tamsulosin HCl (FLOMAX) 0.4 MG CAPS Take 0.4 mg by mouth daily after supper.    . warfarin (COUMADIN) 5 MG tablet 0.5 tablet (2.'5mg'$ ) daily 90 tablet 0   No current facility-administered medications for this visit.   Facility-Administered Medications Ordered in Other Visits  Medication Dose Route Frequency Provider Last Rate Last Dose  . 0.45 % sodium chloride infusion   Intravenous Continuous Forest Gleason, MD   Stopped at 11/08/15 1143    OBJECTIVE: PHYSICAL EXAM: Gen. status: Patient is alert oriented not any acute distress Lymphatic system: Supraclavicular, cervical, axillary, inguinal lymph nodes are not palpable HEENT: No evidence of abnormality Lungs: Emphysematous chest diminished air entry on both sides. Cardiac: Irregular heart sounds GI: Abdomen is soft liver spleen not palpable there is palpable nodule in mid abdominal area 2 cm x 1 cm Abdominal nodule is decrease in size Lower extremity no edema Abdominal wall nodule has decreased in size Skin: No rash Neurological system: Higher functions within normal limit.  Cranial nodes are intact.  No other focal signs Psychiatric system within normal limit.    Filed Vitals:   11/08/15 0936  BP: 100/69  Pulse: 93  Temp: 96.3 F (35.7 C)  Resp: 18     Body mass index is 28.96 kg/(m^2).    ECOG FS:1 - Symptomatic but completely ambulatory  LAB RESULTS:  Infusion on 11/08/2015  Component Date Value Ref Range Status  . WBC 11/08/2015 9.1  3.8 - 10.6 K/uL Final  . RBC 11/08/2015 5.53  4.40 - 5.90 MIL/uL Final  . Hemoglobin 11/08/2015 14.7  13.0 - 18.0 g/dL Final  . HCT 11/08/2015 44.5  40.0 - 52.0 % Final  . MCV 11/08/2015 80.4  80.0 - 100.0 fL Final  . MCH 11/08/2015 26.5  26.0 - 34.0 pg Final  . MCHC 11/08/2015 33.0  32.0 - 36.0 g/dL Final  . RDW 11/08/2015 15.7* 11.5 - 14.5 % Final  . Platelets 11/08/2015 245  150 - 440 K/uL Final  . Neutrophils Relative % 11/08/2015 93   Final  . Neutro  Abs 11/08/2015 8.5* 1.4 - 6.5 K/uL Final  . Lymphocytes Relative 11/08/2015 5   Final  . Lymphs Abs 11/08/2015 0.4* 1.0 - 3.6 K/uL Final  . Monocytes Relative 11/08/2015 1  Final  . Monocytes Absolute 11/08/2015 0.1* 0.2 - 1.0 K/uL Final  . Eosinophils Relative 11/08/2015 1   Final  . Eosinophils Absolute 11/08/2015 0.1  0 - 0.7 K/uL Final  . Basophils Relative 11/08/2015 0   Final  . Basophils Absolute 11/08/2015 0.0  0 - 0.1 K/uL Final  . Sodium 11/08/2015 132* 135 - 145 mmol/L Final  . Potassium 11/08/2015 4.3  3.5 - 5.1 mmol/L Final  . Chloride 11/08/2015 102  101 - 111 mmol/L Final  . CO2 11/08/2015 21* 22 - 32 mmol/L Final  . Glucose, Bld 11/08/2015 162* 65 - 99 mg/dL Final  . BUN 11/08/2015 41* 6 - 20 mg/dL Final  . Creatinine, Ser 11/08/2015 1.42* 0.61 - 1.24 mg/dL Final  . Calcium 11/08/2015 7.9* 8.9 - 10.3 mg/dL Final  . Total Protein 11/08/2015 6.7  6.5 - 8.1 g/dL Final  . Albumin 11/08/2015 3.1* 3.5 - 5.0 g/dL Final  . AST 11/08/2015 20  15 - 41 U/L Final  . ALT 11/08/2015 40  17 - 63 U/L Final  . Alkaline Phosphatase 11/08/2015 89  38 - 126 U/L Final  . Total Bilirubin 11/08/2015 1.1  0.3 - 1.2 mg/dL Final  . GFR calc non Af Amer 11/08/2015 45* >60 mL/min Final  . GFR calc Af Amer 11/08/2015 53* >60 mL/min Final   Comment: (NOTE) The eGFR has been calculated using the CKD EPI equation. This calculation has not been validated in all clinical situations. eGFR's persistently <60 mL/min signify possible Chronic Kidney Disease.   . Anion gap 11/08/2015 9  5 - 15 Final  . Magnesium 11/08/2015 2.1  1.7 - 2.4 mg/dL Final     STUDIES: Mr Outside Films Head/face  10/29/2015  This examination belongs to an outside facility and is stored here for comparison purposes only.  Contact the originating outside institution for any associated report or interpretation.   ASSESSMENT: CT scan of the chest and PET scan has been reviewed independently and reviewed with the  patient. Clinical suspicion is carcinoma of lung which is T2 N2 M1 disease based on PET scan. Biopsies consistent with poorly differentiated carcinoma  We will continue second cycle of chemotherapy.  I discussed consideration off anti-PDL 1 drug like KEYTRUDA But initial chemotherapy response is very good and we need creek response considering patient multiple bone metastases.  Calcium is 7.9 and we will proceed with XGEVA. We will add calcium by mouth Patient's general condition has been declining.  Has persistent nausea reluctant to continue chemotherapy.  Has patient has very high PDL 1 antibody possibility of KEYTRUDA can be considered as recent studies have demonstrative in a non-small cell carcinoma of lung with high PDL 1 antibody immunotherapies as good and better than chemotherapy Patient was explained all the side effects of chemotherapy.  And informed consent has been obtained All the side effects has been discussed.   Today we will start patient on IV fluid.  IV nausea medication.  Patient was advised to stop oral prednisone    Patient expressed understanding and was in agreement with this plan. He also understands that He can call clinic at any time with any questions, concerns, or complaints.    No matching staging information was found for the patient.  Forest Gleason, MD   11/08/2015 1:50 PM

## 2015-11-11 ENCOUNTER — Other Ambulatory Visit: Payer: Self-pay

## 2015-11-11 ENCOUNTER — Emergency Department
Admission: EM | Admit: 2015-11-11 | Discharge: 2015-11-11 | Disposition: A | Discharge status: Home / Self Care | Payer: Medicare Other | Attending: Emergency Medicine | Admitting: Emergency Medicine

## 2015-11-11 ENCOUNTER — Encounter: Payer: Self-pay | Admitting: Emergency Medicine

## 2015-11-11 DIAGNOSIS — R197 Diarrhea, unspecified: Secondary | ICD-10-CM | POA: Insufficient documentation

## 2015-11-11 DIAGNOSIS — R791 Abnormal coagulation profile: Secondary | ICD-10-CM | POA: Diagnosis not present

## 2015-11-11 DIAGNOSIS — Z792 Long term (current) use of antibiotics: Secondary | ICD-10-CM | POA: Diagnosis not present

## 2015-11-11 DIAGNOSIS — Z87891 Personal history of nicotine dependence: Secondary | ICD-10-CM | POA: Insufficient documentation

## 2015-11-11 DIAGNOSIS — I10 Essential (primary) hypertension: Secondary | ICD-10-CM | POA: Diagnosis not present

## 2015-11-11 DIAGNOSIS — R112 Nausea with vomiting, unspecified: Secondary | ICD-10-CM

## 2015-11-11 DIAGNOSIS — Z7901 Long term (current) use of anticoagulants: Secondary | ICD-10-CM | POA: Diagnosis not present

## 2015-11-11 DIAGNOSIS — R5383 Other fatigue: Secondary | ICD-10-CM | POA: Diagnosis present

## 2015-11-11 DIAGNOSIS — Z79891 Long term (current) use of opiate analgesic: Secondary | ICD-10-CM | POA: Insufficient documentation

## 2015-11-11 DIAGNOSIS — Z7952 Long term (current) use of systemic steroids: Secondary | ICD-10-CM | POA: Diagnosis not present

## 2015-11-11 DIAGNOSIS — Z7982 Long term (current) use of aspirin: Secondary | ICD-10-CM | POA: Diagnosis not present

## 2015-11-11 DIAGNOSIS — Z79899 Other long term (current) drug therapy: Secondary | ICD-10-CM | POA: Diagnosis not present

## 2015-11-11 LAB — COMPREHENSIVE METABOLIC PANEL
ALBUMIN: 3 g/dL — AB (ref 3.5–5.0)
ALK PHOS: 83 U/L (ref 38–126)
ALT: 64 U/L — AB (ref 17–63)
AST: 33 U/L (ref 15–41)
Anion gap: 10 (ref 5–15)
BILIRUBIN TOTAL: 1.2 mg/dL (ref 0.3–1.2)
BUN: 30 mg/dL — AB (ref 6–20)
CO2: 21 mmol/L — ABNORMAL LOW (ref 22–32)
CREATININE: 1.22 mg/dL (ref 0.61–1.24)
Calcium: 7.7 mg/dL — ABNORMAL LOW (ref 8.9–10.3)
Chloride: 103 mmol/L (ref 101–111)
GFR calc Af Amer: >60 mL/min (ref >60)
GFR calc non Af Amer: 55 mL/min — ABNORMAL LOW (ref >60)
GLUCOSE: 122 mg/dL — AB (ref 65–99)
POTASSIUM: 4 mmol/L (ref 3.5–5.1)
Sodium: 134 mmol/L — ABNORMAL LOW (ref 135–145)
TOTAL PROTEIN: 6.3 g/dL — AB (ref 6.5–8.1)

## 2015-11-11 LAB — CBC WITH DIFFERENTIAL/PLATELET
BASOS ABS: 0 10*3/uL (ref 0–0.1)
Basophils Relative: 0 %
Eosinophils Absolute: 0 10*3/uL (ref 0–0.7)
Eosinophils Relative: 1 %
HEMATOCRIT: 41.7 % (ref 40.0–52.0)
HEMOGLOBIN: 13.7 g/dL (ref 13.0–18.0)
LYMPHS ABS: 0.7 10*3/uL — AB (ref 1.0–3.6)
LYMPHS PCT: 14 %
MCH: 26.8 pg (ref 26.0–34.0)
MCHC: 32.9 g/dL (ref 32.0–36.0)
MCV: 81.4 fL (ref 80.0–100.0)
Monocytes Absolute: 0.3 10*3/uL (ref 0.2–1.0)
Monocytes Relative: 7 %
NEUTROS ABS: 3.8 10*3/uL (ref 1.4–6.5)
Neutrophils Relative %: 78 %
Platelets: 223 10*3/uL (ref 150–440)
RBC: 5.13 MIL/uL (ref 4.40–5.90)
RDW: 15.5 % — ABNORMAL HIGH (ref 11.5–14.5)
WBC: 4.9 10*3/uL (ref 3.8–10.6)

## 2015-11-11 LAB — TROPONIN I: Troponin I: <0.03 ng/mL (ref <0.031)

## 2015-11-11 LAB — PROTIME-INR
INR: 7.92 — AB
PROTHROMBIN TIME: 63.5 s — AB (ref 11.4–15.0)

## 2015-11-11 LAB — LIPASE, BLOOD: Lipase: 28 U/L (ref 11–51)

## 2015-11-11 MED ORDER — HEPARIN SOD (PORK) LOCK FLUSH 10 UNIT/ML IV SOLN: 10.0000 [IU] | Freq: Once | INTRAVENOUS | Status: DC

## 2015-11-11 MED ORDER — HEPARIN SOD (PORK) LOCK FLUSH 100 UNIT/ML IV SOLN
500.0000 [IU] | Freq: Once | INTRAVENOUS | Status: AC
Start: 2015-11-11 — End: 2015-11-11
  Administered 2015-11-11: 500 [IU] via INTRAVENOUS

## 2015-11-11 MED ORDER — HEPARIN SOD (PORK) LOCK FLUSH 100 UNIT/ML IV SOLN
INTRAVENOUS | Status: AC
  Administered 2015-11-11: 500 [IU] via INTRAVENOUS
  Filled 2015-11-11: qty 5

## 2015-11-11 MED ORDER — SODIUM CHLORIDE 0.9 % IV SOLN
8.0000 mg | Freq: Once | INTRAVENOUS | Status: AC
  Administered 2015-11-11: 8 mg via INTRAVENOUS
  Filled 2015-11-11: qty 4

## 2015-11-11 MED ORDER — SODIUM CHLORIDE 0.9 % IV BOLUS (SEPSIS)
1000.0000 mL | Freq: Once | INTRAVENOUS | Status: AC
  Administered 2015-11-11: 1000 mL via INTRAVENOUS

## 2015-11-11 NOTE — ED Notes (Signed)
Pt has port in chest that is used for chemo.

## 2015-11-11 NOTE — ED Notes (Signed)
Chemo pt that is feeling weak, not eating well,

## 2015-11-11 NOTE — ED Provider Notes (Addendum)
[facility] Emergency Department Provider Note  ____________________________________________  Time seen: Approximately 5:40 PM  I have reviewed the triage vital signs and the nursing notes.   HISTORY  Chief Complaint Fatigue    HPI [name] is a 81 y.o. 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 He says that he has had nausea ever since this past December when he started chemotherapy for his cancer. He says that he has intermittent vomiting that he uses Zofran to treat at home. He denies any increase in the worsening of his vomiting but says he did have 2 episodes of diarrhea yesterday that were black and liquid-like. He currently is on Augmentin for an upper respiratory infection. He denies any pain. Denies any episodes of diarrhea today after taking Imodium. Last chemotherapy treatment was 2 Thursdays ago. He is seen by Dr.Choksi.     Past Medical History  Diagnosis Date  . Hypertension   . Coronary artery disease   . Atrial fibrillation ([place]) 07/2015  . Primary cancer of left upper lobe of lung ([place]) 10/06/2015    Patient Active Problem List   Diagnosis Date Noted  . Protein-calorie malnutrition, severe 10/17/2015  . CINV (chemotherapy-induced nausea and vomiting) 10/16/2015  . Primary cancer of left upper lobe of lung ([place]) 10/06/2015  . Lung mass   . Subcutaneous nodule   . Persistent atrial fibrillation ([place]) 08/29/2015  . Coronary artery disease 08/29/2015  . Atrial fibrillation with tachycardic ventricular rate ([place]) 07/29/2015  . Viral URI with cough 07/29/2015  . Elevated troponin 07/29/2015  . Acute pancreatitis 12/26/2014  . Abdominal pain, acute, epigastric 12/26/2014  . Renal insufficiency, mild 12/26/2014  . Statin intolerance 01/13/2014  . Essential hypertension 01/13/2014  . Obesity (BMI 35.0-39.9 without  comorbidity) ([place]) 01/13/2014  . Encounter for therapeutic drug monitoring 11/14/2013  . Long term current use of anticoagulant therapy 12/28/2012  . BPH (benign prostatic hyperplasia) 05/06/2012  . Thrombocytopenia ([place]) 05/06/2012  . SIRS (systemic inflammatory response syndrome) ([place]) 05/06/2012  . Acute colitis 05/05/2012  . Diarrhea 05/05/2012  . Acute renal failure ([place]) 05/05/2012  . Dehydration 05/05/2012  . Fever 05/05/2012  . Headache(784.0) 05/05/2012  . Tick bite, RMSF IgG .95, IgM .44 05/05/2012  . Abdominal pain, right lower quadrant 05/05/2012  . Dizziness - light-headed 05/05/2012  . S/P CABG x 4: in 2007 (LIMA to LAD, SVG to diagonal, SVG to ramus intermediate, SVG to distal RCA) 05/05/2012    Past Surgical History  Procedure Laterality Date  . Coronary artery bypass graft  12/10/2005    LIMA to LAD,SVG to diagonal,SVG to ramus intermediate,SVG to distal RCA.  . Tonsillectomy    . Tee without cardioversion  05/10/2012    Procedure: TRANSESOPHAGEAL ECHOCARDIOGRAM (TEE);  Surgeon: [name], MD;  Location: [facility] ENDOSCOPY;  Service: Cardiovascular;  Laterality: N/A;  . Cardioversion  05/10/2012    Procedure: CARDIOVERSION;  Surgeon: [name], MD;  Location: [facility] ENDOSCOPY;  Service: Cardiovascular;  Laterality: N/A;  . Cardiac catheterization  10/28/2005    significant 3 vessel CAD  . Nm myocar perf wall motion  09/16/2005    evidence of diaphragmatic attenuation & subtle anterolateral ischemia.  . Cardioversion N/A 07/31/2015    Procedure: CARDIOVERSION;  Surgeon: Josue Hector, MD;  Location: [facility] ENDOSCOPY;  Service: Cardiovascular;  Laterality: N/A;  . Peripheral vascular catheterization N/A 10/18/2015  Procedure: Porta Cath Insertion;  Surgeon: Algernon Huxley, MD;  Location: [place] CV LAB;  Service: Cardiovascular;  Laterality: N/A;    Current Outpatient Rx  Name  Route  Sig  Dispense  Refill  . amoxicillin-clavulanate (AUGMENTIN) 875-125 MG  tablet   Oral   Take 1 tablet by mouth 2 (two) times daily.   14 tablet   0   . aspirin EC 81 MG tablet   Oral   Take 81 mg by mouth daily.         . fentaNYL (DURAGESIC - DOSED MCG/HR) 12 MCG/HR   Transdermal   Place 1 patch (12.5 mcg total) onto the skin every 3 (three) days.   10 patch   0   . guaiFENesin (MUCINEX) 600 MG 12 hr tablet   Oral   Take 1 tablet (600 mg total) by mouth 2 (two) times daily.   60 tablet   0   . HYDROcodone-acetaminophen (NORCO/VICODIN) 5-325 MG tablet   Oral   Take 1 tablet by mouth every 6 (six) hours as needed for moderate pain.   60 tablet   0   . lidocaine-prilocaine (EMLA) cream   Topical   Apply 1 application topically as needed.   30 g   2   . lisinopril (PRINIVIL,ZESTRIL) 10 MG tablet   Oral   Take 1 tablet (10 mg total) by mouth daily.   90 tablet   3   . LORazepam (ATIVAN) 0.5 MG tablet   Oral   Take 0.5 tablets (0.25 mg total) by mouth 2 (two) times daily.   60 tablet   0   . metoprolol succinate (TOPROL-XL) 50 MG 24 hr tablet   Oral   Take 1 tablet (50 mg total) by mouth daily.   90 tablet   3   . omeprazole (PRILOSEC) 20 MG capsule   Oral   Take 1 capsule (20 mg total) by mouth 2 (two) times daily before a meal.   60 capsule   3   . ondansetron (ZOFRAN) 4 MG tablet   Oral   Take 1 tablet (4 mg total) by mouth every 8 (eight) hours as needed for nausea or vomiting.   60 tablet   1   . oxyCODONE-acetaminophen (PERCOCET/ROXICET) 5-325 MG tablet   Oral   Take 1-2 tablets by mouth every 4 (four) hours as needed for moderate pain.   30 tablet   0   . predniSONE (DELTASONE) 20 MG tablet   Oral   Take 1 tablet (20 mg total) by mouth daily with breakfast.   30 tablet   3   . Tamsulosin HCl (FLOMAX) 0.4 MG CAPS   Oral   Take 0.4 mg by mouth daily after supper.         . warfarin (COUMADIN) 5 MG tablet      0.5 tablet (2.'5mg'$ ) daily   90 tablet   0     Allergies Clonidine derivatives; Diltiazem  hcl er; Statins; and Sulfa antibiotics  Family History  Problem Relation Age of Onset  . Stroke Mother 32  . Stroke Father 28  . Heart attack Father 22  . Heart attack Brother 66  . Cancer Sister 73    Social History Social History  Substance Use Topics  . Smoking status: Former Smoker -- 1.00 packs/day for 40 years    Types: Cigarettes    Quit date: 09/19/2005  . Smokeless tobacco: Never Used  . Alcohol Use: No  Review of Systems Constitutional: No fever/chills Eyes: No visual changes. ENT: No sore throat. Cardiovascular: Denies chest pain. Respiratory: Denies shortness of breath. Gastrointestinal: No abdominal pain.   No constipation. Genitourinary: Negative for dysuria. Musculoskeletal: Negative for back pain. Skin: Negative for rash. Neurological: Negative for headaches, focal weakness or numbness.  10-point ROS otherwise negative.  ____________________________________________   PHYSICAL EXAM:  VITAL SIGNS: ED Triage Vitals  Enc Vitals Group     BP 11/11/15 1632 95/68 mmHg     Pulse Rate 11/11/15 1632 111     Resp 11/11/15 1632 18     Temp 11/11/15 1632 98.1 F (36.7 C)     Temp Source 11/11/15 1632 Oral     SpO2 11/11/15 1632 96 %     Weight 11/11/15 1632 167 lb (75.751 kg)     Height 11/11/15 1632 '5\' 6"'$  (1.676 m)     Head Cir --      Peak Flow --      Pain Score 11/11/15 1650 0     Pain Loc --      Pain Edu? --      Excl. in [place]? --     Constitutional: Alert and oriented. Well appearing and in no acute distress. Eyes: Conjunctivae are normal. PERRL. EOMI. Head: Atraumatic. Nose: No congestion/rhinnorhea. Mouth/Throat: Mucous membranes are moist.  Oropharynx non-erythematous. Neck: No stridor.   Cardiovascular: Normal rate, regular rhythm. Grossly normal heart sounds.  Good peripheral circulation. Right sided chest port with needle inserted. Respiratory: Normal respiratory effort.  No retractions. Lungs CTAB. Gastrointestinal: Soft and  nontender. No distention. No abdominal bruits. No CVA tenderness. Rectal exam with small amount of brown stool which is heme negative. Musculoskeletal: No lower extremity tenderness nor edema.  No joint effusions. Neurologic:  Normal speech and language. No gross focal neurologic deficits are appreciated. No gait instability. Skin:  Skin is warm, dry and intact. No rash noted. Psychiatric: Mood and affect are normal. Speech and behavior are normal.  ____________________________________________   LABS (all labs ordered are listed, but only abnormal results are displayed)  Labs Reviewed  CBC WITH DIFFERENTIAL/PLATELET - Abnormal; Notable for the following:    RDW 15.5 (*)    Lymphs Abs 0.7 (*)    All other components within normal limits  COMPREHENSIVE METABOLIC PANEL - Abnormal; Notable for the following:    Sodium 134 (*)    CO2 21 (*)    Glucose, Bld 122 (*)    BUN 30 (*)    Calcium 7.7 (*)    Total Protein 6.3 (*)    Albumin 3.0 (*)    ALT 64 (*)    GFR calc non Af Amer 55 (*)    All other components within normal limits  PROTIME-INR - Abnormal; Notable for the following:    Prothrombin Time 63.5 (*)    INR 7.92 (*)    All other components within normal limits  TROPONIN I  LIPASE, BLOOD   ____________________________________________  EKG  ED ECG REPORT I, Doran Stabler, the attending physician, personally viewed and interpreted this ECG.   Date: 11/11/2015  EKG Time: 1822  Rate: 102  Rhythm: atrial fibrillation, rate 102  Axis: Normal  Intervals:none  ST&T Change: No ST segment elevation or depression. No abnormal T-wave inversion. PVC times one.  ____________________________________________  RADIOLOGY   ____________________________________________   PROCEDURES  ____________________________________________   INITIAL IMPRESSION / ASSESSMENT AND PLAN / ED COURSE  Pertinent labs & imaging results that were available  during my care of the patient  were reviewed by me and considered in my medical decision making (see chart for details).  Doubt C. difficile secondary to no episodes of diarrhea today. Only 2 episodes yesterday.  ----------------------------------------- 9:58 PM on 11/11/2015 -----------------------------------------  Patient feels much improved at this time. Did not have any episodes of vomiting or diarrhea throughout his emergency department stay. His INR was found to be 7.92. However, he was not found to have any bleeding source. I discussed with the patient as well as family the need to hold his Coumadin over the next 2 doses. He will not take his dose tonight and also tomorrow night as well. He knows to resume it this coming Tuesday. He will be followed up this Thursday at the cancer center where he says he has his INR checked. I advised him discussed with Dr. Oliva Bustard at that times that he may have his INR rechecked. The patient and the family understood the plan and willing to comply. The patient is now tolerating by mouth liquids and fluids. He has Zofran at home and knows to return if he has any further difficulty with nausea and vomiting.  ____________________________________________   FINAL CLINICAL IMPRESSION(S) / ED DIAGNOSES    Nausea vomiting and diarrhea. Coumadin coagulopathy.    Orbie Pyo, MD 11/11/15 2200  Doubt C. difficile as a cause of the patient's diarrhea. No episodes today. Also no recent antibiotics. Patient's heart rate was 90 upon my last examination in the room.  Orbie Pyo, MD 11/11/15 [phone]

## 2015-11-11 NOTE — ED Notes (Signed)
Pharmacy called for zofran ivpb.

## 2015-11-11 NOTE — ED Notes (Signed)
Pt states he has been unable to keep anything down. Vomiting and frequent diarrhea which he states is "black".

## 2015-11-12 ENCOUNTER — Telehealth: Payer: Self-pay | Admitting: *Deleted

## 2015-11-12 NOTE — Telephone Encounter (Signed)
Called to report coughing up blood. When checked chart, I see he went to ER yesterday and was having black liquid diarrhea and had an INR of 7.9. Was instructed by ER to hold his coumadin for 2 days then resume and to keep appt with [facility] for lab on Thursday. I spoke with Dr Grayland Ormond and returned call to daughter and advised that patient needs to return to ER now. I called and spoke with ER charge nurse to let her know he is returning with hemoptysis

## 2015-11-15 ENCOUNTER — Inpatient Hospital Stay: Payer: Medicare Other

## 2015-11-15 ENCOUNTER — Inpatient Hospital Stay (HOSPITAL_BASED_OUTPATIENT_CLINIC_OR_DEPARTMENT_OTHER): Payer: Medicare Other | Admitting: Oncology

## 2015-11-15 ENCOUNTER — Encounter: Payer: Self-pay | Admitting: Oncology

## 2015-11-15 VITALS — BP 123/82 | HR 105 | Temp 97.3°F | Resp 18 | Wt 178.2 lb

## 2015-11-15 DIAGNOSIS — R112 Nausea with vomiting, unspecified: Secondary | ICD-10-CM

## 2015-11-15 DIAGNOSIS — Z5111 Encounter for antineoplastic chemotherapy: Secondary | ICD-10-CM | POA: Diagnosis not present

## 2015-11-15 DIAGNOSIS — C3412 Malignant neoplasm of upper lobe, left bronchus or lung: Secondary | ICD-10-CM

## 2015-11-15 DIAGNOSIS — R05 Cough: Secondary | ICD-10-CM

## 2015-11-15 DIAGNOSIS — J90 Pleural effusion, not elsewhere classified: Secondary | ICD-10-CM | POA: Diagnosis not present

## 2015-11-15 DIAGNOSIS — R229 Localized swelling, mass and lump, unspecified: Secondary | ICD-10-CM

## 2015-11-15 DIAGNOSIS — I4891 Unspecified atrial fibrillation: Secondary | ICD-10-CM

## 2015-11-15 DIAGNOSIS — Z87891 Personal history of nicotine dependence: Secondary | ICD-10-CM

## 2015-11-15 DIAGNOSIS — C7951 Secondary malignant neoplasm of bone: Secondary | ICD-10-CM | POA: Diagnosis not present

## 2015-11-15 DIAGNOSIS — J3801 Paralysis of vocal cords and larynx, unilateral: Secondary | ICD-10-CM

## 2015-11-15 DIAGNOSIS — R490 Dysphonia: Secondary | ICD-10-CM

## 2015-11-15 DIAGNOSIS — C792 Secondary malignant neoplasm of skin: Secondary | ICD-10-CM

## 2015-11-15 DIAGNOSIS — R0602 Shortness of breath: Secondary | ICD-10-CM

## 2015-11-15 DIAGNOSIS — Z7901 Long term (current) use of anticoagulants: Secondary | ICD-10-CM

## 2015-11-15 LAB — COMPREHENSIVE METABOLIC PANEL
ALK PHOS: 89 U/L (ref 38–126)
ALT: 34 U/L (ref 17–63)
AST: 16 U/L (ref 15–41)
Albumin: 2.9 g/dL — ABNORMAL LOW (ref 3.5–5.0)
Anion gap: 5 (ref 5–15)
BUN: 21 mg/dL — AB (ref 6–20)
CHLORIDE: 106 mmol/L (ref 101–111)
CO2: 22 mmol/L (ref 22–32)
CREATININE: 1.19 mg/dL (ref 0.61–1.24)
Calcium: 7.5 mg/dL — ABNORMAL LOW (ref 8.9–10.3)
GFR calc Af Amer: >60 mL/min (ref >60)
GFR calc non Af Amer: 56 mL/min — ABNORMAL LOW (ref >60)
Glucose, Bld: 112 mg/dL — ABNORMAL HIGH (ref 65–99)
Potassium: 4 mmol/L (ref 3.5–5.1)
SODIUM: 133 mmol/L — AB (ref 135–145)
Total Bilirubin: 0.8 mg/dL (ref 0.3–1.2)
Total Protein: 6.4 g/dL — ABNORMAL LOW (ref 6.5–8.1)

## 2015-11-15 LAB — CBC WITH DIFFERENTIAL/PLATELET
Basophils Absolute: 0 10*3/uL (ref 0–0.1)
Basophils Relative: 1 %
EOS ABS: 0 10*3/uL (ref 0–0.7)
Eosinophils Relative: 1 %
HEMATOCRIT: 38.1 % — AB (ref 40.0–52.0)
HEMOGLOBIN: 12.6 g/dL — AB (ref 13.0–18.0)
LYMPHS ABS: 0.6 10*3/uL — AB (ref 1.0–3.6)
Lymphocytes Relative: 21 %
MCH: 26.8 pg (ref 26.0–34.0)
MCHC: 33.2 g/dL (ref 32.0–36.0)
MCV: 80.9 fL (ref 80.0–100.0)
Monocytes Absolute: 0.4 10*3/uL (ref 0.2–1.0)
Monocytes Relative: 15 %
NEUTROS ABS: 1.7 10*3/uL (ref 1.4–6.5)
NEUTROS PCT: 62 %
Platelets: 187 10*3/uL (ref 150–440)
RBC: 4.71 MIL/uL (ref 4.40–5.90)
RDW: 16.2 % — ABNORMAL HIGH (ref 11.5–14.5)
WBC: 2.7 10*3/uL — AB (ref 3.8–10.6)

## 2015-11-15 LAB — MAGNESIUM: MAGNESIUM: 1.8 mg/dL (ref 1.7–2.4)

## 2015-11-15 LAB — PROTIME-INR
INR: 3.71
Prothrombin Time: 35.9 seconds — ABNORMAL HIGH (ref 11.4–15.0)

## 2015-11-15 MED ORDER — WARFARIN SODIUM 5 MG PO TABS
ORAL_TABLET | ORAL | Status: DC
Start: 2015-11-15 — End: 2015-11-29

## 2015-11-15 MED ORDER — SODIUM CHLORIDE 0.9% FLUSH
10.0000 mL | INTRAVENOUS | Status: DC | PRN
  Administered 2015-11-15: 10 mL via INTRAVENOUS
  Filled 2015-11-15: qty 10

## 2015-11-15 MED ORDER — IPRATROPIUM-ALBUTEROL 0.5-2.5 (3) MG/3ML IN SOLN
3.0000 mL | RESPIRATORY_TRACT | Status: DC | PRN
Start: 2015-11-15 — End: 2015-11-28

## 2015-11-15 MED ORDER — SODIUM CHLORIDE 0.9 % IV SOLN
200.0000 mg | Freq: Once | INTRAVENOUS | Status: AC
  Administered 2015-11-15: 200 mg via INTRAVENOUS
  Filled 2015-11-15: qty 8

## 2015-11-15 MED ORDER — HEPARIN SOD (PORK) LOCK FLUSH 100 UNIT/ML IV SOLN
500.0000 [IU] | Freq: Once | INTRAVENOUS | Status: AC
  Administered 2015-11-15: 500 [IU] via INTRAVENOUS
  Filled 2015-11-15: qty 5

## 2015-11-15 MED ORDER — MIRTAZAPINE 15 MG PO TABS: 15.0000 mg | ORAL_TABLET | Freq: Every day | ORAL | Status: DC

## 2015-11-15 MED ORDER — SODIUM CHLORIDE 0.9 % IV SOLN
Freq: Once | INTRAVENOUS | Status: AC
  Administered 2015-11-15: 11:00:00 via INTRAVENOUS
  Filled 2015-11-15: qty 1000

## 2015-11-15 NOTE — Progress Notes (Signed)
Patients INR was checked in ED and was elevated.  Was told to get it checked again today.  Patient states at night when he lays down he feels like he is choking.  Patient requesting refill on pain medication.  Also wants to know if he can have rx for something to help him sleep?

## 2015-11-15 NOTE — Progress Notes (Signed)
[place] @ [facility] [phone]  Fax:([phone]) [place]: [birth year]  MR#: [id number]  BZJ#:[id number]  Patient Care Team: Pcp Not In System as PCP - General Carloyn Manner, MD as Referring Physician (Otolaryngology)  CHIEF COMPLAINT: Oncology history Abnormal CT scan of the chest (November, 2016) Irregular 3.2 x 2.5 left upper lobe lung mass.  Ipsilateral peribronchial and ipsilateral hilar lymphadenopathy.  Right upper lobe supper solid pulmonary nodule.  Multiple pulmonary nodules stress pleural effusion./.  2.  Biopsy of abdominal wall nodule was poorly differentiated   Carcinoma  . CK 7 positive,\TTF negative. Clinically consistent with lung primary  PET scan shows multiple bone metastases T2 N2 M1 B stage IV disease metastases to the bone and abdominal wall.  3.  PDL 10 was more than 50%  4.PATIENT WILL BE STARTED ON Beryle Flock FROM jANUARY  25TH, 2016 Chief Complaint  Patient presents with  . Lung Cancer    VISIT DIAGNOSIS:     ICD-9-CM ICD-10-CM   1. Primary cancer of left upper lobe of lung (HCC) 162.3 C34.12 Protime-INR     warfarin (COUMADIN) 5 MG tablet     ipratropium-albuterol (DUONEB) 0.5-2.5 (3) MG/3ML SOLN     mirtazapine (REMERON) 15 MG tablet     Comprehensive metabolic panel     Magnesium     Protime-INR     Protime-INR  2. Atrial fibrillation with tachycardic ventricular rate (HCC) 427.31 I48.91 Protime-INR     warfarin (COUMADIN) 5 MG tablet     ipratropium-albuterol (DUONEB) 0.5-2.5 (3) MG/3ML SOLN     mirtazapine (REMERON) 15 MG tablet     Comprehensive metabolic panel     Magnesium     Protime-INR     Protime-INR      No history exists.    Oncology Flowsheet 10/16/2015 10/16/2015 10/17/2015 11/02/2015 11/08/2015 11/11/2015 11/15/2015  Day, Cycle - - - Day 1, Cycle 1 - - -  CARBOplatin (PARAPLATIN) IV - - - 370 mg - - -  denosumab (XGEVA) Dora - - - 120 mg - - -  dexamethasone (DECADRON) IV - - - [  20 mg ] [ 4 mg ] - -  fosaprepitant (EMEND) IV - - - - - - -  ondansetron (ZOFRAN) IV 8 mg 8 mg 8 mg [ 16 mg ] [ 8 mg ] 8 mg -  PACLitaxel (TAXOL) IV - - - 204 mg - - -  palonosetron (ALOXI) IV - - - - - - -  pembrolizumab (KEYTRUDA) IV - - - - - - 200 mg    INTERVAL HISTORY:  81 year old gentleman started having discomfort in the chest.  Patient went to emergency room in [facility] followed by [facility]  Multiple admission.  Patient has a history of atrial fibrillation underwent several times for defibrillation.  Without much success on chronic Coumadin therapy.  Patient continues to complain of dry hacking cough.  ,hoarseness of voice. Patient was evaluated by ENT surgeon  Dr. Pryor Ochoa and the left vocal cord paralysis was found. CT scan was ordered which revealed a lung mass and PET scan revealed multiple bone metastases.  Patient was referred to me for further evaluation and treatment consideration  Patient started having cough and hemoptysis.  INR was high.  Patient was in the emergency room.  Coumadin was discontinued and restarted.  Today INR is 35.  No bleeding.  Patient continues to feel weak and tired. Continues to have back pain.  REVIEW OF SYSTEMS:   Gen. status: he is feeling much stronger.  not able to sleep because of frequency of passing urine already on Flomax HEENT: Hoarseness of voice.  Her cyst Lungs: Shortness of breath.  Dry hacking cough.  Patient has a squeezing pain in both chest wall area.  Patient complains of cough and greenish expectoration and congestion GI:.  Nausea and vomiting continues  Appetite is getting better. GU: No dysuria hematuria, as to get up every 2 hours to pass urine on Flomax Skin: No rash Neurological system no headache no dizziness.  No other focal symptoms Musculoskeletal system back pain. Patient is not taking medication round-the-clock for pain control. Lower extremity intermittent swelling Cardiac history of  atrial fibrillation on chronic Coumadin therapy Psychiatric system: No evidence of abnormality  As per HPI. Otherwise, a complete review of systems is negatve.  PAST MEDICAL HISTORY: Past Medical History  Diagnosis Date  . Hypertension   . Coronary artery disease   . Atrial fibrillation ([place]) 07/2015  . Primary cancer of left upper lobe of lung ([place]) 10/06/2015    PAST SURGICAL HISTORY: Past Surgical History  Procedure Laterality Date  . Coronary artery bypass graft  12/10/2005    LIMA to LAD,SVG to diagonal,SVG to ramus intermediate,SVG to distal RCA.  . Tonsillectomy    . Tee without cardioversion  05/10/2012    Procedure: TRANSESOPHAGEAL ECHOCARDIOGRAM (TEE);  Surgeon: [name], MD;  Location: [facility] ENDOSCOPY;  Service: Cardiovascular;  Laterality: N/A;  . Cardioversion  05/10/2012    Procedure: CARDIOVERSION;  Surgeon: [name], MD;  Location: [facility] ENDOSCOPY;  Service: Cardiovascular;  Laterality: N/A;  . Cardiac catheterization  10/28/2005    significant 3 vessel CAD  . Nm myocar perf wall motion  09/16/2005    evidence of diaphragmatic attenuation & subtle anterolateral ischemia.  . Cardioversion N/A 07/31/2015    Procedure: CARDIOVERSION;  Surgeon: Josue Hector, MD;  Location: [facility] ENDOSCOPY;  Service: Cardiovascular;  Laterality: N/A;  . Peripheral vascular catheterization N/A 10/18/2015    Procedure: Glori Luis Cath Insertion;  Surgeon: Algernon Huxley, MD;  Location: [facility] CV LAB;  Service: Cardiovascular;  Laterality: N/A;    FAMILY HISTORY Family History  Problem Relation Age of Onset  . Stroke Mother 62  . Stroke Father 84  . Heart attack Father 1  . Heart attack Brother 8  . Cancer Sister 89       ADVANCED DIRECTIVES:  Patient does have advance healthcare directive, Patient   does not desire to make any changes  HEALTH MAINTENANCE: Social History  Substance Use Topics  . Smoking status: Former Smoker -- 1.00 packs/day for 40 years    Types:  Cigarettes    Quit date: 09/19/2005  . Smokeless tobacco: Never Used  . Alcohol Use: No      Allergies  Allergen Reactions  . Clonidine Derivatives Other (See Comments)    Pt was not aware of a reaction to this medication  . Diltiazem Hcl Er Other (See Comments)    High blood pressure  . Statins Other (See Comments)    Myalgias:  Previously on Crestor,Vytorin,Simvastatin,Lipitor & Pravastatin  . Sulfa Antibiotics Swelling    Current Outpatient Prescriptions  Medication Sig Dispense Refill  . aspirin EC 81 MG tablet Take 81 mg by mouth daily.    . fentaNYL (DURAGESIC - DOSED MCG/HR) 12 MCG/HR Place 1 patch (12.5 mcg total) onto the skin every 3 (three) days. 10 patch 0  . guaiFENesin (  MUCINEX) 600 MG 12 hr tablet Take 1 tablet (600 mg total) by mouth 2 (two) times daily. 60 tablet 0  . HYDROcodone-acetaminophen (NORCO/VICODIN) 5-325 MG tablet Take 1 tablet by mouth every 6 (six) hours as needed for moderate pain. 60 tablet 0  . lidocaine-prilocaine (EMLA) cream Apply 1 application topically as needed. 30 g 2  . lisinopril (PRINIVIL,ZESTRIL) 10 MG tablet Take 1 tablet (10 mg total) by mouth daily. 90 tablet 3  . LORazepam (ATIVAN) 0.5 MG tablet Take 0.5 tablets (0.25 mg total) by mouth 2 (two) times daily. 60 tablet 0  . metoprolol succinate (TOPROL-XL) 50 MG 24 hr tablet Take 1 tablet (50 mg total) by mouth daily. 90 tablet 3  . omeprazole (PRILOSEC) 20 MG capsule Take 1 capsule (20 mg total) by mouth 2 (two) times daily before a meal. 60 capsule 3  . ondansetron (ZOFRAN) 4 MG tablet Take 1 tablet (4 mg total) by mouth every 8 (eight) hours as needed for nausea or vomiting. 60 tablet 1  . oxyCODONE-acetaminophen (PERCOCET/ROXICET) 5-325 MG tablet Take 1-2 tablets by mouth every 4 (four) hours as needed for moderate pain. 30 tablet 0  . predniSONE (DELTASONE) 20 MG tablet Take 1 tablet (20 mg total) by mouth daily with breakfast. 30 tablet 3  . Tamsulosin HCl (FLOMAX) 0.4 MG CAPS Take  0.4 mg by mouth daily after supper.    . warfarin (COUMADIN) 5 MG tablet 0.5 tablet (2.59m) by mouth 5 days a week. Starting on Monday 10/3015. 90 tablet 0  . ipratropium-albuterol (DUONEB) 0.5-2.5 (3) MG/3ML SOLN Take 3 mLs by nebulization every 4 (four) hours as needed. 360 mL 3  . mirtazapine (REMERON) 15 MG tablet Take 1 tablet (15 mg total) by mouth at bedtime. 15 tablet 0   No current facility-administered medications for this visit.   Facility-Administered Medications Ordered in Other Visits  Medication Dose Route Frequency Provider Last Rate Last Dose  . sodium chloride flush (NS) 0.9 % injection 10 mL  10 mL Intravenous PRN JForest Gleason MD   10 mL at 11/15/15 0920    OBJECTIVE: PHYSICAL EXAM: Gen. status: Patient is alert oriented not any acute distress Lymphatic system: Supraclavicular, cervical, axillary, inguinal lymph nodes are not palpable HEENT: No evidence of abnormality Lungs: Emphysematous chest diminished air entry on both sides. Cardiac: Irregular heart sounds GI: Abdomen is soft liver spleen not palpable there is palpable nodule in mid abdominal area 2 cm x 1 cm Abdominal nodule is decrease in size Lower extremity no edema Abdominal wall nodule has decreased in size Skin: No rash Neurological system: Higher functions within normal limit.  Cranial nodes are intact.  No other focal signs Psychiatric system within normal limit.    Filed Vitals:   11/15/15 1036  BP: 123/82  Pulse: 105  Temp: 97.3 F (36.3 C)  Resp: 18     Body mass index is 28.78 kg/(m^2).    ECOG FS:1 - Symptomatic but completely ambulatory  LAB RESULTS:  Infusion on 11/15/2015  Component Date Value Ref Range Status  . WBC 11/15/2015 2.7* 3.8 - 10.6 K/uL Final  . RBC 11/15/2015 4.71  4.40 - 5.90 MIL/uL Final  . Hemoglobin 11/15/2015 12.6* 13.0 - 18.0 g/dL Final  . HCT 11/15/2015 38.1* 40.0 - 52.0 % Final  . MCV 11/15/2015 80.9  80.0 - 100.0 fL Final  . MCH 11/15/2015 26.8  26.0 - 34.0  pg Final  . MCHC 11/15/2015 33.2  32.0 - 36.0 g/dL Final  .  RDW 11/15/2015 16.2* 11.5 - 14.5 % Final  . Platelets 11/15/2015 187  150 - 440 K/uL Final  . Neutrophils Relative % 11/15/2015 62   Final  . Neutro Abs 11/15/2015 1.7  1.4 - 6.5 K/uL Final  . Lymphocytes Relative 11/15/2015 21   Final  . Lymphs Abs 11/15/2015 0.6* 1.0 - 3.6 K/uL Final  . Monocytes Relative 11/15/2015 15   Final  . Monocytes Absolute 11/15/2015 0.4  0.2 - 1.0 K/uL Final  . Eosinophils Relative 11/15/2015 1   Final  . Eosinophils Absolute 11/15/2015 0.0  0 - 0.7 K/uL Final  . Basophils Relative 11/15/2015 1   Final  . Basophils Absolute 11/15/2015 0.0  0 - 0.1 K/uL Final  . Magnesium 11/15/2015 1.8  1.7 - 2.4 mg/dL Final  . Sodium 11/15/2015 133* 135 - 145 mmol/L Final  . Potassium 11/15/2015 4.0  3.5 - 5.1 mmol/L Final  . Chloride 11/15/2015 106  101 - 111 mmol/L Final  . CO2 11/15/2015 22  22 - 32 mmol/L Final  . Glucose, Bld 11/15/2015 112* 65 - 99 mg/dL Final  . BUN 11/15/2015 21* 6 - 20 mg/dL Final  . Creatinine, Ser 11/15/2015 1.19  0.61 - 1.24 mg/dL Final  . Calcium 11/15/2015 7.5* 8.9 - 10.3 mg/dL Final  . Total Protein 11/15/2015 6.4* 6.5 - 8.1 g/dL Final  . Albumin 11/15/2015 2.9* 3.5 - 5.0 g/dL Final  . AST 11/15/2015 16  15 - 41 U/L Final  . ALT 11/15/2015 34  17 - 63 U/L Final  . Alkaline Phosphatase 11/15/2015 89  38 - 126 U/L Final  . Total Bilirubin 11/15/2015 0.8  0.3 - 1.2 mg/dL Final  . GFR calc non Af Amer 11/15/2015 56* >60 mL/min Final  . GFR calc Af Amer 11/15/2015 >60  >60 mL/min Final   Comment: (NOTE) The eGFR has been calculated using the CKD EPI equation. This calculation has not been validated in all clinical situations. eGFR's persistently <60 mL/min signify possible Chronic Kidney Disease.   . Anion gap 11/15/2015 5  5 - 15 Final  . Prothrombin Time 11/15/2015 35.9* 11.4 - 15.0 seconds Final  . INR 11/15/2015 3.71   Final  Admission on 11/11/2015, Discharged on 11/11/2015   Component Date Value Ref Range Status  . WBC 11/11/2015 4.9  3.8 - 10.6 K/uL Final  . RBC 11/11/2015 5.13  4.40 - 5.90 MIL/uL Final  . Hemoglobin 11/11/2015 13.7  13.0 - 18.0 g/dL Final  . HCT 11/11/2015 41.7  40.0 - 52.0 % Final  . MCV 11/11/2015 81.4  80.0 - 100.0 fL Final  . MCH 11/11/2015 26.8  26.0 - 34.0 pg Final  . MCHC 11/11/2015 32.9  32.0 - 36.0 g/dL Final  . RDW 11/11/2015 15.5* 11.5 - 14.5 % Final  . Platelets 11/11/2015 223  150 - 440 K/uL Final  . Neutrophils Relative % 11/11/2015 78   Final  . Neutro Abs 11/11/2015 3.8  1.4 - 6.5 K/uL Final  . Lymphocytes Relative 11/11/2015 14   Final  . Lymphs Abs 11/11/2015 0.7* 1.0 - 3.6 K/uL Final  . Monocytes Relative 11/11/2015 7   Final  . Monocytes Absolute 11/11/2015 0.3  0.2 - 1.0 K/uL Final  . Eosinophils Relative 11/11/2015 1   Final  . Eosinophils Absolute 11/11/2015 0.0  0 - 0.7 K/uL Final  . Basophils Relative 11/11/2015 0   Final  . Basophils Absolute 11/11/2015 0.0  0 - 0.1 K/uL Final  . Troponin I 11/11/2015 <0.03  <  0.031 ng/mL Final   Comment:        NO INDICATION OF MYOCARDIAL INJURY.   . Sodium 11/11/2015 134* 135 - 145 mmol/L Final  . Potassium 11/11/2015 4.0  3.5 - 5.1 mmol/L Final  . Chloride 11/11/2015 103  101 - 111 mmol/L Final  . CO2 11/11/2015 21* 22 - 32 mmol/L Final  . Glucose, Bld 11/11/2015 122* 65 - 99 mg/dL Final  . BUN 11/11/2015 30* 6 - 20 mg/dL Final  . Creatinine, Ser 11/11/2015 1.22  0.61 - 1.24 mg/dL Final  . Calcium 11/11/2015 7.7* 8.9 - 10.3 mg/dL Final  . Total Protein 11/11/2015 6.3* 6.5 - 8.1 g/dL Final  . Albumin 11/11/2015 3.0* 3.5 - 5.0 g/dL Final  . AST 11/11/2015 33  15 - 41 U/L Final  . ALT 11/11/2015 64* 17 - 63 U/L Final  . Alkaline Phosphatase 11/11/2015 83  38 - 126 U/L Final  . Total Bilirubin 11/11/2015 1.2  0.3 - 1.2 mg/dL Final  . GFR calc non Af Amer 11/11/2015 55* >60 mL/min Final  . GFR calc Af Amer 11/11/2015 >60  >60 mL/min Final   Comment: (NOTE) The eGFR has  been calculated using the CKD EPI equation. This calculation has not been validated in all clinical situations. eGFR's persistently <60 mL/min signify possible Chronic Kidney Disease.   . Anion gap 11/11/2015 10  5 - 15 Final  . Lipase 11/11/2015 28  11 - 51 U/L Final  . Prothrombin Time 11/11/2015 63.5* 11.4 - 15.0 seconds Final  . INR 11/11/2015 7.92*  Final   Comment: CRITICAL RESULT CALLED TO, READ BACK BY AND VERIFIED WITH: ANN CALES AT 1910 11/11/15.PMH      STUDIES: No results found.  ASSESSMENT: CT scan of the chest and PET scan has been reviewed independently and reviewed with the patient. Clinical suspicion is carcinoma of lung which is T2 N2 M1 disease based on PET scan. Biopsies consistent with poorly differentiated carcinoma .  Proceed with chemotherapy with Lizabeth Leyden. 2.  Pro-time is slightly elevated. I have asked patient to stop Coumadin for next few days and then decrease the dose to half tablet of 5 mg 5 days a week and we will recheck pro time next week.Marland Kitchen Possibility of changing it to a loquacious or xeralto can be considered. 3.  Regarding shortness of breath have asked patient to proceed with nebulizer Your name has been recommended     Patient expressed understanding and was in agreement with this plan. He also understands that He can call clinic at any time with any questions, concerns, or complaints.    No matching staging information was found for the patient.  Forest Gleason, MD   11/15/2015 1:48 PM

## 2015-11-20 ENCOUNTER — Other Ambulatory Visit: Payer: Self-pay | Admitting: *Deleted

## 2015-11-20 DIAGNOSIS — C3412 Malignant neoplasm of upper lobe, left bronchus or lung: Secondary | ICD-10-CM

## 2015-11-22 ENCOUNTER — Inpatient Hospital Stay: Payer: Medicare Other | Attending: Family Medicine

## 2015-11-22 ENCOUNTER — Emergency Department: Payer: Medicare Other

## 2015-11-22 ENCOUNTER — Telehealth: Payer: Self-pay | Admitting: *Deleted

## 2015-11-22 ENCOUNTER — Inpatient Hospital Stay (HOSPITAL_BASED_OUTPATIENT_CLINIC_OR_DEPARTMENT_OTHER): Payer: Medicare Other | Admitting: Family Medicine

## 2015-11-22 ENCOUNTER — Other Ambulatory Visit: Payer: Self-pay

## 2015-11-22 ENCOUNTER — Inpatient Hospital Stay
Admission: EM | Admit: 2015-11-22 | Discharge: 2015-11-29 | DRG: 683 | Disposition: A | Discharge status: Home / Self Care | Payer: Medicare Other | Attending: Internal Medicine | Admitting: Internal Medicine

## 2015-11-22 ENCOUNTER — Encounter: Payer: Self-pay | Admitting: *Deleted

## 2015-11-22 VITALS — BP 84/42 | HR 110 | Temp 96.7°F | Resp 116 | Wt 176.5 lb

## 2015-11-22 DIAGNOSIS — I129 Hypertensive chronic kidney disease with stage 1 through stage 4 chronic kidney disease, or unspecified chronic kidney disease: Secondary | ICD-10-CM | POA: Diagnosis present

## 2015-11-22 DIAGNOSIS — C7951 Secondary malignant neoplasm of bone: Secondary | ICD-10-CM | POA: Diagnosis present

## 2015-11-22 DIAGNOSIS — Z87891 Personal history of nicotine dependence: Secondary | ICD-10-CM | POA: Insufficient documentation

## 2015-11-22 DIAGNOSIS — I509 Heart failure, unspecified: Secondary | ICD-10-CM | POA: Diagnosis present

## 2015-11-22 DIAGNOSIS — Z7982 Long term (current) use of aspirin: Secondary | ICD-10-CM | POA: Diagnosis not present

## 2015-11-22 DIAGNOSIS — Z79891 Long term (current) use of opiate analgesic: Secondary | ICD-10-CM

## 2015-11-22 DIAGNOSIS — Z66 Do not resuscitate: Secondary | ICD-10-CM | POA: Diagnosis present

## 2015-11-22 DIAGNOSIS — R5381 Other malaise: Secondary | ICD-10-CM | POA: Insufficient documentation

## 2015-11-22 DIAGNOSIS — C3412 Malignant neoplasm of upper lobe, left bronchus or lung: Secondary | ICD-10-CM | POA: Insufficient documentation

## 2015-11-22 DIAGNOSIS — I2510 Atherosclerotic heart disease of native coronary artery without angina pectoris: Secondary | ICD-10-CM | POA: Diagnosis present

## 2015-11-22 DIAGNOSIS — I959 Hypotension, unspecified: Secondary | ICD-10-CM | POA: Diagnosis present

## 2015-11-22 DIAGNOSIS — Z951 Presence of aortocoronary bypass graft: Secondary | ICD-10-CM | POA: Diagnosis not present

## 2015-11-22 DIAGNOSIS — R530 Neoplastic (malignant) related fatigue: Secondary | ICD-10-CM

## 2015-11-22 DIAGNOSIS — N179 Acute kidney failure, unspecified: Secondary | ICD-10-CM | POA: Diagnosis present

## 2015-11-22 DIAGNOSIS — J9811 Atelectasis: Secondary | ICD-10-CM | POA: Diagnosis present

## 2015-11-22 DIAGNOSIS — F32A Depression, unspecified: Secondary | ICD-10-CM

## 2015-11-22 DIAGNOSIS — Z7901 Long term (current) use of anticoagulants: Secondary | ICD-10-CM | POA: Diagnosis not present

## 2015-11-22 DIAGNOSIS — I130 Hypertensive heart and chronic kidney disease with heart failure and stage 1 through stage 4 chronic kidney disease, or unspecified chronic kidney disease: Secondary | ICD-10-CM | POA: Diagnosis present

## 2015-11-22 DIAGNOSIS — Z7952 Long term (current) use of systemic steroids: Secondary | ICD-10-CM | POA: Diagnosis not present

## 2015-11-22 DIAGNOSIS — Z79899 Other long term (current) drug therapy: Secondary | ICD-10-CM | POA: Insufficient documentation

## 2015-11-22 DIAGNOSIS — R0600 Dyspnea, unspecified: Secondary | ICD-10-CM | POA: Diagnosis not present

## 2015-11-22 DIAGNOSIS — Z8249 Family history of ischemic heart disease and other diseases of the circulatory system: Secondary | ICD-10-CM | POA: Diagnosis not present

## 2015-11-22 DIAGNOSIS — R634 Abnormal weight loss: Secondary | ICD-10-CM

## 2015-11-22 DIAGNOSIS — R0602 Shortness of breath: Secondary | ICD-10-CM

## 2015-11-22 DIAGNOSIS — R630 Anorexia: Secondary | ICD-10-CM | POA: Insufficient documentation

## 2015-11-22 DIAGNOSIS — Z85118 Personal history of other malignant neoplasm of bronchus and lung: Secondary | ICD-10-CM

## 2015-11-22 DIAGNOSIS — I4891 Unspecified atrial fibrillation: Secondary | ICD-10-CM | POA: Insufficient documentation

## 2015-11-22 DIAGNOSIS — I10 Essential (primary) hypertension: Secondary | ICD-10-CM | POA: Insufficient documentation

## 2015-11-22 DIAGNOSIS — Z809 Family history of malignant neoplasm, unspecified: Secondary | ICD-10-CM | POA: Diagnosis not present

## 2015-11-22 DIAGNOSIS — E871 Hypo-osmolality and hyponatremia: Secondary | ICD-10-CM | POA: Diagnosis not present

## 2015-11-22 DIAGNOSIS — N184 Chronic kidney disease, stage 4 (severe): Secondary | ICD-10-CM | POA: Diagnosis present

## 2015-11-22 DIAGNOSIS — Z882 Allergy status to sulfonamides status: Secondary | ICD-10-CM

## 2015-11-22 DIAGNOSIS — C792 Secondary malignant neoplasm of skin: Secondary | ICD-10-CM | POA: Diagnosis not present

## 2015-11-22 DIAGNOSIS — C3490 Malignant neoplasm of unspecified part of unspecified bronchus or lung: Secondary | ICD-10-CM | POA: Diagnosis present

## 2015-11-22 DIAGNOSIS — Z9889 Other specified postprocedural states: Secondary | ICD-10-CM

## 2015-11-22 DIAGNOSIS — R1310 Dysphagia, unspecified: Secondary | ICD-10-CM | POA: Diagnosis present

## 2015-11-22 DIAGNOSIS — J3801 Paralysis of vocal cords and larynx, unilateral: Secondary | ICD-10-CM | POA: Diagnosis present

## 2015-11-22 DIAGNOSIS — I482 Chronic atrial fibrillation: Secondary | ICD-10-CM | POA: Diagnosis present

## 2015-11-22 DIAGNOSIS — R531 Weakness: Secondary | ICD-10-CM

## 2015-11-22 DIAGNOSIS — Z515 Encounter for palliative care: Secondary | ICD-10-CM | POA: Diagnosis not present

## 2015-11-22 DIAGNOSIS — J90 Pleural effusion, not elsewhere classified: Secondary | ICD-10-CM | POA: Diagnosis present

## 2015-11-22 DIAGNOSIS — Z888 Allergy status to other drugs, medicaments and biological substances status: Secondary | ICD-10-CM | POA: Diagnosis not present

## 2015-11-22 DIAGNOSIS — R000 Tachycardia, unspecified: Secondary | ICD-10-CM | POA: Insufficient documentation

## 2015-11-22 DIAGNOSIS — R0981 Nasal congestion: Secondary | ICD-10-CM | POA: Insufficient documentation

## 2015-11-22 DIAGNOSIS — R791 Abnormal coagulation profile: Secondary | ICD-10-CM | POA: Diagnosis present

## 2015-11-22 DIAGNOSIS — R627 Adult failure to thrive: Secondary | ICD-10-CM | POA: Diagnosis present

## 2015-11-22 DIAGNOSIS — F329 Major depressive disorder, single episode, unspecified: Secondary | ICD-10-CM

## 2015-11-22 DIAGNOSIS — Z823 Family history of stroke: Secondary | ICD-10-CM

## 2015-11-22 DIAGNOSIS — R42 Dizziness and giddiness: Secondary | ICD-10-CM

## 2015-11-22 DIAGNOSIS — H9313 Tinnitus, bilateral: Secondary | ICD-10-CM

## 2015-11-22 DIAGNOSIS — R350 Frequency of micturition: Secondary | ICD-10-CM

## 2015-11-22 LAB — COMPREHENSIVE METABOLIC PANEL
ALBUMIN: 2.5 g/dL — AB (ref 3.5–5.0)
ALT: 29 U/L (ref 17–63)
AST: 26 U/L (ref 15–41)
Alkaline Phosphatase: 103 U/L (ref 38–126)
Anion gap: 10 (ref 5–15)
BUN: 41 mg/dL — AB (ref 6–20)
CHLORIDE: 104 mmol/L (ref 101–111)
CO2: 19 mmol/L — AB (ref 22–32)
Calcium: 7.7 mg/dL — ABNORMAL LOW (ref 8.9–10.3)
Creatinine, Ser: 2.5 mg/dL — ABNORMAL HIGH (ref 0.61–1.24)
GFR calc Af Amer: 27 mL/min — ABNORMAL LOW (ref >60)
GFR, EST NON AFRICAN AMERICAN: 23 mL/min — AB (ref >60)
GLUCOSE: 104 mg/dL — AB (ref 65–99)
POTASSIUM: 4.7 mmol/L (ref 3.5–5.1)
SODIUM: 133 mmol/L — AB (ref 135–145)
Total Bilirubin: 0.7 mg/dL (ref 0.3–1.2)
Total Protein: 6.4 g/dL — ABNORMAL LOW (ref 6.5–8.1)

## 2015-11-22 LAB — CBC WITH DIFFERENTIAL/PLATELET
BASOS ABS: 0.1 10*3/uL (ref 0–0.1)
BASOS ABS: 0.1 10*3/uL (ref 0–0.1)
BASOS PCT: 1 %
Basophils Relative: 1 %
EOS ABS: 0 10*3/uL (ref 0–0.7)
EOS ABS: 0 10*3/uL (ref 0–0.7)
EOS PCT: 0 %
Eosinophils Relative: 0 %
HCT: 38.3 % — ABNORMAL LOW (ref 40.0–52.0)
HCT: 34.6 % — ABNORMAL LOW (ref 40.0–52.0)
Hemoglobin: 12.7 g/dL — ABNORMAL LOW (ref 13.0–18.0)
Hemoglobin: 11.3 g/dL — ABNORMAL LOW (ref 13.0–18.0)
Lymphocytes Relative: 10 %
Lymphocytes Relative: 11 %
Lymphs Abs: 0.9 10*3/uL — ABNORMAL LOW (ref 1.0–3.6)
Lymphs Abs: 0.8 10*3/uL — ABNORMAL LOW (ref 1.0–3.6)
MCH: 26.7 pg (ref 26.0–34.0)
MCH: 26.2 pg (ref 26.0–34.0)
MCHC: 33.1 g/dL (ref 32.0–36.0)
MCHC: 32.8 g/dL (ref 32.0–36.0)
MCV: 80.6 fL (ref 80.0–100.0)
MCV: 79.7 fL — ABNORMAL LOW (ref 80.0–100.0)
MONO ABS: 0.5 10*3/uL (ref 0.2–1.0)
MONO ABS: 0.5 10*3/uL (ref 0.2–1.0)
MONOS PCT: 6 %
Monocytes Relative: 7 %
NEUTROS PCT: 83 %
Neutro Abs: 7.5 10*3/uL — ABNORMAL HIGH (ref 1.4–6.5)
Neutro Abs: 6.1 10*3/uL (ref 1.4–6.5)
Neutrophils Relative %: 81 %
PLATELETS: 178 10*3/uL (ref 150–440)
Platelets: 196 10*3/uL (ref 150–440)
RBC: 4.76 MIL/uL (ref 4.40–5.90)
RBC: 4.34 MIL/uL — AB (ref 4.40–5.90)
RDW: 17.1 % — AB (ref 11.5–14.5)
RDW: 16.5 % — AB (ref 11.5–14.5)
WBC: 9.1 10*3/uL (ref 3.8–10.6)
WBC: 7.4 10*3/uL (ref 3.8–10.6)

## 2015-11-22 LAB — GLUCOSE, CAPILLARY: GLUCOSE-CAPILLARY: 84 mg/dL (ref 65–99)

## 2015-11-22 LAB — TROPONIN I

## 2015-11-22 LAB — PROTIME-INR
INR: 4.28 — AB
PROTHROMBIN TIME: 40 s — AB (ref 11.4–15.0)

## 2015-11-22 LAB — MAGNESIUM: MAGNESIUM: 2 mg/dL (ref 1.7–2.4)

## 2015-11-22 MED ORDER — ACETAMINOPHEN 325 MG PO TABS
650.0000 mg | ORAL_TABLET | Freq: Four times a day (QID) | ORAL | Status: DC | PRN
  Administered 2015-11-25 – 2015-11-27 (×2): 650 mg via ORAL
  Filled 2015-11-22: qty 2

## 2015-11-22 MED ORDER — ALBUTEROL SULFATE (2.5 MG/3ML) 0.083% IN NEBU: 2.5000 mg | INHALATION_SOLUTION | RESPIRATORY_TRACT | Status: DC | PRN

## 2015-11-22 MED ORDER — ASPIRIN EC 81 MG PO TBEC
81.0000 mg | DELAYED_RELEASE_TABLET | Freq: Every day | ORAL | Status: DC
  Administered 2015-11-22 – 2015-11-28 (×7): 81 mg via ORAL
  Filled 2015-11-22 (×7): qty 1

## 2015-11-22 MED ORDER — SODIUM CHLORIDE 0.9 % IV BOLUS (SEPSIS)
500.0000 mL | Freq: Once | INTRAVENOUS | Status: AC
  Administered 2015-11-22: 500 mL via INTRAVENOUS

## 2015-11-22 MED ORDER — ONDANSETRON HCL 4 MG/2ML IJ SOLN
4.0000 mg | Freq: Four times a day (QID) | INTRAMUSCULAR | Status: DC | PRN
  Administered 2015-11-27: 17:00:00 4 mg via INTRAVENOUS
  Filled 2015-11-22: qty 2

## 2015-11-22 MED ORDER — OXYCODONE-ACETAMINOPHEN 5-325 MG PO TABS
1.0000 | ORAL_TABLET | ORAL | Status: DC | PRN
  Administered 2015-11-26: 1 via ORAL
  Filled 2015-11-22: qty 1

## 2015-11-22 MED ORDER — MIRTAZAPINE 15 MG PO TABS
15.0000 mg | ORAL_TABLET | Freq: Every evening | ORAL | Status: DC | PRN
  Administered 2015-11-25: 15 mg via ORAL
  Filled 2015-11-22: qty 1

## 2015-11-22 MED ORDER — ACETAMINOPHEN 650 MG RE SUPP: 650.0000 mg | Freq: Four times a day (QID) | RECTAL | Status: DC | PRN

## 2015-11-22 MED ORDER — TAMSULOSIN HCL 0.4 MG PO CAPS
0.4000 mg | ORAL_CAPSULE | Freq: Every day | ORAL | Status: DC
  Administered 2015-11-22 – 2015-11-28 (×7): 0.4 mg via ORAL
  Filled 2015-11-22 (×7): qty 1

## 2015-11-22 MED ORDER — KCL IN DEXTROSE-NACL 20-5-0.9 MEQ/L-%-% IV SOLN
INTRAVENOUS | Status: DC
  Administered 2015-11-22 – 2015-11-23 (×2): via INTRAVENOUS
  Filled 2015-11-22 (×5): qty 1000

## 2015-11-22 MED ORDER — HEPARIN SODIUM (PORCINE) 5000 UNIT/ML IJ SOLN
5000.0000 [IU] | Freq: Three times a day (TID) | INTRAMUSCULAR | Status: DC
  Administered 2015-11-22 – 2015-11-23 (×2): 5000 [IU] via SUBCUTANEOUS
  Filled 2015-11-22 (×2): qty 1

## 2015-11-22 MED ORDER — HYDROCODONE-ACETAMINOPHEN 5-325 MG PO TABS
1.0000 | ORAL_TABLET | Freq: Four times a day (QID) | ORAL | Status: DC | PRN
  Administered 2015-11-23 (×2): 1 via ORAL
  Filled 2015-11-22 (×2): qty 1

## 2015-11-22 MED ORDER — SODIUM CHLORIDE 0.9 % IV BOLUS (SEPSIS)
1000.0000 mL | Freq: Once | INTRAVENOUS | Status: AC
  Administered 2015-11-22: 1000 mL via INTRAVENOUS

## 2015-11-22 MED ORDER — ONDANSETRON HCL 4 MG PO TABS: 4.0000 mg | ORAL_TABLET | Freq: Four times a day (QID) | ORAL | Status: DC | PRN

## 2015-11-22 MED ORDER — LORAZEPAM 0.5 MG PO TABS
0.2500 mg | ORAL_TABLET | Freq: Two times a day (BID) | ORAL | Status: DC
  Administered 2015-11-22 – 2015-11-29 (×14): 0.25 mg via ORAL
  Filled 2015-11-22 (×14): qty 1

## 2015-11-22 MED ORDER — GUAIFENESIN ER 600 MG PO TB12
600.0000 mg | ORAL_TABLET | Freq: Two times a day (BID) | ORAL | Status: DC
  Administered 2015-11-22 – 2015-11-28 (×12): 600 mg via ORAL
  Filled 2015-11-22 (×12): qty 1

## 2015-11-22 NOTE — Telephone Encounter (Signed)
PT 40 / INR 4.28. Results reported to L Herring, AGNP-C who said pt is in ER so I called ER Chg and gave verbal report to her

## 2015-11-22 NOTE — H&P (Addendum)
[facility] [place] NAME: [name]    MR#:  [id number]  DATE OF BIRTH:  30-Sep-1935  DATE OF ADMISSION:  11/22/2015  PRIMARY CARE PHYSICIAN: Pcp Not In System   REQUESTING/REFERRING PHYSICIAN: Joanne Gavel, MD  CHIEF COMPLAINT:   Chief Complaint  Patient presents with  . Hypotension  . Dizziness   dizziness, weakness and poor oral intake for the past a couple weeks.   HISTORY OF PRESENT ILLNESS:  [name]  is a 81 y.o. male with a known history of hypertension, coronary artery disease, atrial fibrillation on Coumadin, stage IV lung cancer with metastasis. The patient got chemotherapy at the end of last month.he has had poor oral intake, dizziness and generalized weakness for the past the 2 weeks. He went to Dr. Metro Kung office and was found to have low blood pressure at 80s.  He was sent to the ED for further evaluation.  HE ALSO COMPLAINS OF SHORTNESS OF BREATH. BUT HE DENIES ANY FEVER OR CHILLS.  his blood pressure in the ED was 66/45 and he was found to have acute renal failure with creatinine increased to more than 2.5.  He was treated with normal saline bolus.  PAST MEDICAL HISTORY:   Past Medical History  Diagnosis Date  . Hypertension   . Coronary artery disease   . Atrial fibrillation ([place]) 07/2015  . Primary cancer of left upper lobe of lung ([place]) 10/06/2015    PAST SURGICAL HISTORY:   Past Surgical History  Procedure Laterality Date  . Coronary artery bypass graft  12/10/2005    LIMA to LAD,SVG to diagonal,SVG to ramus intermediate,SVG to distal RCA.  . Tonsillectomy    . Tee without cardioversion  05/10/2012    Procedure: TRANSESOPHAGEAL ECHOCARDIOGRAM (TEE);  Surgeon: [name], MD;  Location: [facility] ENDOSCOPY;  Service: Cardiovascular;  Laterality: N/A;  . Cardioversion  05/10/2012    Procedure: CARDIOVERSION;  Surgeon: [name], MD;  Location: [facility] ENDOSCOPY;  Service: Cardiovascular;   Laterality: N/A;  . Cardiac catheterization  10/28/2005    significant 3 vessel CAD  . Nm myocar perf wall motion  09/16/2005    evidence of diaphragmatic attenuation & subtle anterolateral ischemia.  . Cardioversion N/A 07/31/2015    Procedure: CARDIOVERSION;  Surgeon: Josue Hector, MD;  Location: [facility] ENDOSCOPY;  Service: Cardiovascular;  Laterality: N/A;  . Peripheral vascular catheterization N/A 10/18/2015    Procedure: Glori Luis Cath Insertion;  Surgeon: Algernon Huxley, MD;  Location: [facility] CV LAB;  Service: Cardiovascular;  Laterality: N/A;    SOCIAL HISTORY:   Social History  Substance Use Topics  . Smoking status: Former Smoker -- 1.00 packs/day for 40 years    Types: Cigarettes    Quit date: 09/19/2005  . Smokeless tobacco: Never Used  . Alcohol Use: No    FAMILY HISTORY:   Family History  Problem Relation Age of Onset  . Stroke Mother 67  . Stroke Father 10  . Heart attack Father 100  . Heart attack Brother 73  . Cancer Sister 44    DRUG ALLERGIES:   Allergies  Allergen Reactions  . Clonidine Derivatives Other (See Comments)    Pt was not aware of a reaction to this medication  . Diltiazem Hcl Er Other (See Comments)    High blood pressure  . Statins Other (See Comments)    Myalgias:  Previously on Crestor,Vytorin,Simvastatin,Lipitor & Pravastatin  . Sulfa Antibiotics Swelling  REVIEW OF SYSTEMS:  CONSTITUTIONAL: No fever, but has poor oral intake, dizziness and generalized weakness.  EYES: No blurred or double vision.  EARS, NOSE, AND THROAT: No tinnitus or ear pain.  RESPIRATORY: No cough,  Has shortness of breath, no wheezing or hemoptysis.  CARDIOVASCULAR: No chest pain, orthopnea, edema.  GASTROINTESTINAL: No nausea, vomiting, diarrhea or abdominal pain.  GENITOURINARY: No dysuria, hematuria.  ENDOCRINE: No polyuria, nocturia,  HEMATOLOGY: No anemia, easy bruising or bleeding SKIN: No rash or lesion. MUSCULOSKELETAL: No joint pain or arthritis.    NEUROLOGIC: No tingling, numbness, weakness.  PSYCHIATRY: No anxiety or depression.   MEDICATIONS AT HOME:   Prior to Admission medications   Medication Sig Start Date End Date Taking? Authorizing Provider  aspirin EC 81 MG tablet Take 81 mg by mouth daily.   Yes Historical Provider, MD  guaiFENesin (MUCINEX) 600 MG 12 hr tablet Take 1 tablet (600 mg total) by mouth 2 (two) times daily. 11/02/15 11/30/15 Yes Forest Gleason, MD  HYDROcodone-acetaminophen (NORCO/VICODIN) 5-325 MG tablet Take 1 tablet by mouth every 6 (six) hours as needed for moderate pain. 11/02/15  Yes Forest Gleason, MD  ipratropium-albuterol (DUONEB) 0.5-2.5 (3) MG/3ML SOLN Take 3 mLs by nebulization every 4 (four) hours as needed. Patient taking differently: Take 3 mLs by nebulization every 4 (four) hours as needed (for shortness of breath).  11/15/15  Yes Forest Gleason, MD  lidocaine-prilocaine (EMLA) cream Apply 1 application topically as needed. Patient taking differently: Apply 1 application topically as needed (before treatment).  10/18/15  Yes Evlyn Kanner, NP  lisinopril (PRINIVIL,ZESTRIL) 10 MG tablet Take 1 tablet (10 mg total) by mouth daily. Patient taking differently: Take 10 mg by mouth daily as needed (for high blood pressure).  09/24/15  Yes [name], MD  LORazepam (ATIVAN) 0.5 MG tablet Take 0.5 tablets (0.25 mg total) by mouth 2 (two) times daily. 10/18/15  Yes Evlyn Kanner, NP  metoprolol succinate (TOPROL-XL) 50 MG 24 hr tablet Take 1 tablet (50 mg total) by mouth daily. 08/27/15  Yes [name], MD  mirtazapine (REMERON) 15 MG tablet Take 1 tablet (15 mg total) by mouth at bedtime. Patient taking differently: Take 15 mg by mouth at bedtime as needed (for sleep).  11/15/15  Yes Forest Gleason, MD  ondansetron (ZOFRAN) 4 MG tablet Take 1 tablet (4 mg total) by mouth every 8 (eight) hours as needed for nausea or vomiting. 10/19/15  Yes Forest Gleason, MD  oxyCODONE-acetaminophen (PERCOCET/ROXICET) 5-325  MG tablet Take 1-2 tablets by mouth every 4 (four) hours as needed for moderate pain. 10/18/15  Yes Evlyn Kanner, NP  Tamsulosin HCl (FLOMAX) 0.4 MG CAPS Take 0.4 mg by mouth daily after supper.   Yes Historical Provider, MD  warfarin (COUMADIN) 5 MG tablet 0.5 tablet (2.'5mg'$ ) by mouth 5 days a week. Starting on Monday 10/3015. Patient taking differently: Take 2.5 mg by mouth every other day. In the evening 11/15/15  Yes Forest Gleason, MD  fentaNYL (DURAGESIC - DOSED MCG/HR) 12 MCG/HR Place 1 patch (12.5 mcg total) onto the skin every 3 (three) days. Patient not taking: Reported on 11/22/2015 10/08/15   Forest Gleason, MD  omeprazole (PRILOSEC) 20 MG capsule Take 1 capsule (20 mg total) by mouth 2 (two) times daily before a meal. Patient not taking: Reported on 11/22/2015 10/08/15   Forest Gleason, MD  predniSONE (DELTASONE) 20 MG tablet Take 1 tablet (20 mg total) by mouth daily with breakfast. Patient not taking: Reported on 11/22/2015 10/02/15  Forest Gleason, MD      VITAL SIGNS:  Blood pressure 102/66, pulse 91, temperature 97.7 F (36.5 C), temperature source Oral, resp. rate 23, weight 72.576 kg (160 lb), SpO2 95 %.  PHYSICAL EXAMINATION:  GENERAL:  81 y.o.-year-old patient lying in the bed with no acute distress.  EYES: Pupils equal, round, reactive to light and accommodation. No scleral icterus. Extraocular muscles intact.  HEENT: Head atraumatic, normocephalic. Oropharynx and nasopharynx clear.  mild dry oral mucosa.  NECK:  Supple, no jugular venous distention. No thyroid enlargement, no tenderness.  LUNGS: Normal breath sounds bilaterally, no wheezing, rales,rhonchi or crepitation. No use of accessory muscles of respiration.  CARDIOVASCULAR: S1, S2 normal. No murmurs, rubs, or gallops.  ABDOMEN: Soft, nontender, nondistended. Bowel sounds present. No organomegaly or mass.  EXTREMITIES: No pedal edema, cyanosis, or clubbing.  NEUROLOGIC: Cranial nerves II through XII are intact. Muscle  strength 4/5 in all extremities. Sensation intact. Gait not checked.  PSYCHIATRIC: The patient is alert and oriented x 3.  SKIN: No obvious rash, lesion, or ulcer.   LABORATORY PANEL:   CBC  Recent Labs Lab 11/22/15 1638  WBC 7.4  HGB 11.3*  HCT 34.6*  PLT 178   ------------------------------------------------------------------------------------------------------------------  Chemistries   Recent Labs Lab 11/22/15 1638  NA 133*  K 4.7  CL 104  CO2 19*  GLUCOSE 104*  BUN 41*  CREATININE 2.50*  CALCIUM 7.7*  AST 26  ALT 29  ALKPHOS 103  BILITOT 0.7   ------------------------------------------------------------------------------------------------------------------  Cardiac Enzymes  Recent Labs Lab 11/22/15 1638  TROPONINI <0.03   ------------------------------------------------------------------------------------------------------------------  RADIOLOGY:  Dg Chest Portable 1 View  11/22/2015  CLINICAL DATA:  Weakness, low blood pressure, dehydration, lung cancer, hypertension, coronary artery disease EXAM: PORTABLE CHEST 1 VIEW COMPARISON:  Portable exam 1632 hours compared to 07/29/2015 FINDINGS: RIGHT jugular Port-A-Cath with tip projecting over SVC. Enlargement of cardiac silhouette post CABG with pulmonary vascular congestion. New moderate LEFT pleural effusion and basilar atelectasis. Minimal RIGHT basilar atelectasis Question perihilar edema. No pneumothorax or acute osseous findings. IMPRESSION: Enlargement of cardiac silhouette post CABG with pulmonary vascular congestion and question mild pulmonary edema. Moderate LEFT pleural effusion and and basilar atelectasis new since prior exam. Electronically Signed   By: Lavonia Dana M.D.   On: 11/22/2015 16:45    EKG:   Orders placed or performed during the hospital encounter of 11/22/15  . ED EKG  . ED EKG    IMPRESSION AND PLAN:   Hypotension. Better after normal saline bolus. Continue normal saline IV  rehydration. hold the Cipro and Lopressor.  Acute renal failure with dehydration. Continue normal saline IV and follow-up BMP.  Hyponatremia. Continue normal saline and follow-up BMP.  Chronic A. Fib. Rate controlled, Hold Coumadin and Lopressor .  Supratherapeutic INR. Hold Coumadin.  Lung cancer and  pleural effusion.  oncology consult from Dr. Oliva Bustard.      All the records are reviewed and case discussed with ED provider. Management plans discussed with the patient, his wife and daughter and they are in agreement.  CODE STATUS: DO NOT RESUSCITATE  TOTAL TIME TAKING CARE OF THIS PATIENT: 57 minutes.    Demetrios Loll M.D on 11/22/2015 at 6:41 PM  Between 7am to 6pm - Pager - [phone]  After 6pm go to [email or web address] - password [email or web address] [facility]  Office  [phone]  CC: Primary care physician; Pcp Not In System

## 2015-11-22 NOTE — ED Notes (Signed)
Sent over from Cancer center with weakness  Low blood pressure   Per family possible dehydration

## 2015-11-22 NOTE — Progress Notes (Signed)
Patient states he cannot eat well.  Feels like he has a lump in his throat.  Not drinking so well either.  States he has pain in his right side . BP today 88/64 .  HR 116.  Patient states he feels like there is air in his ears and he is going to pass out.  Patient complains of weakness today as well.  Patient accompanied by daughter today at this visit who states she feels like he should be hosptialized.

## 2015-11-22 NOTE — Telephone Encounter (Signed)
Has lab appt this afternoon, but is very SOB adn asking if he can be seen by md. Appt given for 230 with L Herring, AGNP-C. Also asking if we would suggest Cancer rehab program to pt when he comes in

## 2015-11-22 NOTE — ED Provider Notes (Signed)
[facility] Emergency Department Provider Note  ____________________________________________  Time seen: Approximately 4:14 PM  I have reviewed the triage vital signs and the nursing notes.   HISTORY  Chief Complaint Hypotension and Dizziness    HPI [name] is a 81 y.o. male with hypertension, coronary artery disease, atrial fibrillation on Coumadin, stage IV lung cancer with metastasis to bone and abdominal wall on Kaytruda and followed by Dr. Oliva Bustard, hoarse voice secondary to left vocal cord paralysis who presents for evaluation of generalized weakness, hypotension, lightheadedness at [facility] today, gradual onset, constant since onset, currently severe. The patient was being seen in the cancer center today for INR check. He was noted to be tachycardic with heart rate of 116 as well as hypotensive with systolic blood pressure in the 80s. He was complaining of lightheadedness and acute on chronic shortness of breath and was sent to the emergency department for further evaluation. He has had poor by mouth intake secondary to nausea but no vomiting, no diarrhea. No abdominal pain or chest pain. No fevers at home. No dysuria. He has chronic ringing in his ears but no headache, no numbness or weakness.   Past Medical History  Diagnosis Date  . Hypertension   . Coronary artery disease   . Atrial fibrillation ([place]) 07/2015  . Primary cancer of left upper lobe of lung ([place]) 10/06/2015    Patient Active Problem List   Diagnosis Date Noted  . ARF (acute renal failure) ([place]) 11/22/2015  . Hypotension 11/22/2015  . Protein-calorie malnutrition, severe 10/17/2015  . CINV (chemotherapy-induced nausea and vomiting) 10/16/2015  . Primary cancer of left upper lobe of lung ([place]) 10/06/2015  . Lung mass   . Subcutaneous nodule   . Persistent atrial fibrillation ([place]) 08/29/2015  . Coronary artery disease 08/29/2015  . Atrial fibrillation with tachycardic  ventricular rate ([place]) 07/29/2015  . Viral URI with cough 07/29/2015  . Elevated troponin 07/29/2015  . Acute pancreatitis 12/26/2014  . Abdominal pain, acute, epigastric 12/26/2014  . Renal insufficiency, mild 12/26/2014  . Statin intolerance 01/13/2014  . Essential hypertension 01/13/2014  . Obesity (BMI 35.0-39.9 without comorbidity) ([place]) 01/13/2014  . Encounter for therapeutic drug monitoring 11/14/2013  . Long term current use of anticoagulant therapy 12/28/2012  . BPH (benign prostatic hyperplasia) 05/06/2012  . Thrombocytopenia ([place]) 05/06/2012  . SIRS (systemic inflammatory response syndrome) ([place]) 05/06/2012  . Acute colitis 05/05/2012  . Diarrhea 05/05/2012  . Acute renal failure ([place]) 05/05/2012  . Dehydration 05/05/2012  . Fever 05/05/2012  . Headache(784.0) 05/05/2012  . Tick bite, RMSF IgG .95, IgM .44 05/05/2012  . Abdominal pain, right lower quadrant 05/05/2012  . Dizziness - light-headed 05/05/2012  . S/P CABG x 4: in 2007 (LIMA to LAD, SVG to diagonal, SVG to ramus intermediate, SVG to distal RCA) 05/05/2012    Past Surgical History  Procedure Laterality Date  . Coronary artery bypass graft  12/10/2005    LIMA to LAD,SVG to diagonal,SVG to ramus intermediate,SVG to distal RCA.  . Tonsillectomy    . Tee without cardioversion  05/10/2012    Procedure: TRANSESOPHAGEAL ECHOCARDIOGRAM (TEE);  Surgeon: [name], MD;  Location: [facility] ENDOSCOPY;  Service: Cardiovascular;  Laterality: N/A;  . Cardioversion  05/10/2012    Procedure: CARDIOVERSION;  Surgeon: [name], MD;  Location: [facility] ENDOSCOPY;  Service: Cardiovascular;  Laterality: N/A;  . Cardiac catheterization  10/28/2005    significant 3 vessel CAD  . Nm myocar perf wall motion  09/16/2005  evidence of diaphragmatic attenuation & subtle anterolateral ischemia.  . Cardioversion N/A 07/31/2015    Procedure: CARDIOVERSION;  Surgeon: Josue Hector, MD;  Location: [facility] ENDOSCOPY;  Service: Cardiovascular;   Laterality: N/A;  . Peripheral vascular catheterization N/A 10/18/2015    Procedure: Glori Luis Cath Insertion;  Surgeon: Algernon Huxley, MD;  Location: [facility] CV LAB;  Service: Cardiovascular;  Laterality: N/A;    Current Outpatient Rx  Name  Route  Sig  Dispense  Refill  . aspirin EC 81 MG tablet   Oral   Take 81 mg by mouth daily.         Marland Kitchen guaiFENesin (MUCINEX) 600 MG 12 hr tablet   Oral   Take 1 tablet (600 mg total) by mouth 2 (two) times daily.   60 tablet   0   . HYDROcodone-acetaminophen (NORCO/VICODIN) 5-325 MG tablet   Oral   Take 1 tablet by mouth every 6 (six) hours as needed for moderate pain.   60 tablet   0   . ipratropium-albuterol (DUONEB) 0.5-2.5 (3) MG/3ML SOLN   Nebulization   Take 3 mLs by nebulization every 4 (four) hours as needed. Patient taking differently: Take 3 mLs by nebulization every 4 (four) hours as needed (for shortness of breath).    360 mL   3   . lidocaine-prilocaine (EMLA) cream   Topical   Apply 1 application topically as needed. Patient taking differently: Apply 1 application topically as needed (before treatment).    30 g   2   . lisinopril (PRINIVIL,ZESTRIL) 10 MG tablet   Oral   Take 1 tablet (10 mg total) by mouth daily. Patient taking differently: Take 10 mg by mouth daily as needed (for high blood pressure).    90 tablet   3   . LORazepam (ATIVAN) 0.5 MG tablet   Oral   Take 0.5 tablets (0.25 mg total) by mouth 2 (two) times daily.   60 tablet   0   . metoprolol succinate (TOPROL-XL) 50 MG 24 hr tablet   Oral   Take 1 tablet (50 mg total) by mouth daily.   90 tablet   3   . mirtazapine (REMERON) 15 MG tablet   Oral   Take 1 tablet (15 mg total) by mouth at bedtime. Patient taking differently: Take 15 mg by mouth at bedtime as needed (for sleep).    15 tablet   0   . ondansetron (ZOFRAN) 4 MG tablet   Oral   Take 1 tablet (4 mg total) by mouth every 8 (eight) hours as needed for nausea or vomiting.   60  tablet   1   . oxyCODONE-acetaminophen (PERCOCET/ROXICET) 5-325 MG tablet   Oral   Take 1-2 tablets by mouth every 4 (four) hours as needed for moderate pain.   30 tablet   0   . Tamsulosin HCl (FLOMAX) 0.4 MG CAPS   Oral   Take 0.4 mg by mouth daily after supper.         . warfarin (COUMADIN) 5 MG tablet      0.5 tablet (2.'5mg'$ ) by mouth 5 days a week. Starting on Monday 10/3015. Patient taking differently: Take 2.5 mg by mouth every other day. In the evening   90 tablet   0   . fentaNYL (DURAGESIC - DOSED MCG/HR) 12 MCG/HR   Transdermal   Place 1 patch (12.5 mcg total) onto the skin every 3 (three) days. Patient not taking: Reported on 11/22/2015  10 patch   0   . omeprazole (PRILOSEC) 20 MG capsule   Oral   Take 1 capsule (20 mg total) by mouth 2 (two) times daily before a meal. Patient not taking: Reported on 11/22/2015   60 capsule   3   . predniSONE (DELTASONE) 20 MG tablet   Oral   Take 1 tablet (20 mg total) by mouth daily with breakfast. Patient not taking: Reported on 11/22/2015   30 tablet   3     Allergies Clonidine derivatives; Diltiazem hcl er; Statins; and Sulfa antibiotics  Family History  Problem Relation Age of Onset  . Stroke Mother 16  . Stroke Father 48  . Heart attack Father 106  . Heart attack Brother 37  . Cancer Sister 1    Social History Social History  Substance Use Topics  . Smoking status: Former Smoker -- 1.00 packs/day for 40 years    Types: Cigarettes    Quit date: 09/19/2005  . Smokeless tobacco: Never Used  . Alcohol Use: No    Review of Systems Constitutional: No fever/chills Eyes: No visual changes. ENT: No sore throat. Cardiovascular: Denies chest pain. Respiratory: + shortness of breath. Gastrointestinal: No abdominal pain.  + nausea, no vomiting.  No diarrhea.  No constipation. Genitourinary: Negative for dysuria. Musculoskeletal: Negative for back pain. Skin: Negative for rash. Neurological: Negative for  headaches, focal weakness or numbness.  10-point ROS otherwise negative.  ____________________________________________   PHYSICAL EXAM:  VITAL SIGNS: ED Triage Vitals  Enc Vitals Group     BP 11/22/15 1558 66/45 mmHg     Pulse Rate 11/22/15 1558 91     Resp 11/22/15 1558 18     Temp 11/22/15 1558 97.7 F (36.5 C)     Temp Source 11/22/15 1558 Oral     SpO2 11/22/15 1558 98 %     Weight 11/22/15 1558 160 lb (72.576 kg)     Height --      Head Cir --      Peak Flow --      Pain Score --      Pain Loc --      Pain Edu? --      Excl. in [place]? --     Constitutional: Alert and oriented. Chronically fatigued appearing but nontoxic. Eyes: Conjunctivae are normal. PERRL. EOMI. Head: Atraumatic. Nose: No congestion/rhinnorhea. Mouth/Throat: Mucous membranes are moist.  Oropharynx non-erythematous. Neck: No stridor.   Cardiovascular: Normal rate, irregular rhythm. Grossly normal heart sounds.  Good peripheral circulation. Respiratory: Mild tachypnea. Diminished breath sounds in bilateral bases. No wheeze. Gastrointestinal: Soft and nontender. No distention.  No CVA tenderness. Genitourinary: deferred Musculoskeletal: No lower extremity tenderness nor edema.  No joint effusions. Neurologic:  Normal speech and language. No gross focal neurologic deficits are appreciated.  Skin:  Skin is warm, dry and intact. No rash noted. Psychiatric: Mood and affect are normal. Speech and behavior are normal.  ____________________________________________   LABS (all labs ordered are listed, but only abnormal results are displayed)  Labs Reviewed  CBC WITH DIFFERENTIAL/PLATELET - Abnormal; Notable for the following:    RBC 4.34 (*)    Hemoglobin 11.3 (*)    HCT 34.6 (*)    MCV 79.7 (*)    RDW 16.5 (*)    Lymphs Abs 0.8 (*)    All other components within normal limits  COMPREHENSIVE METABOLIC PANEL - Abnormal; Notable for the following:    Sodium 133 (*)    CO2 19 (*)  Glucose, Bld 104  (*)    BUN 41 (*)    Creatinine, Ser 2.50 (*)    Calcium 7.7 (*)    Total Protein 6.4 (*)    Albumin 2.5 (*)    GFR calc non Af Amer 23 (*)    GFR calc Af Amer 27 (*)    All other components within normal limits  TROPONIN I   ____________________________________________  EKG  ED ECG REPORT I, Joanne Gavel, the attending physician, personally viewed and interpreted this ECG.   Date: 11/22/2015  EKG Time: 17:01  Rate: 92  Rhythm: atrial fibrillation, rate 92  Axis: normal  Intervals: Normal intervals with the exception of PR interval which cannot be calculated due to A. fib  ST&T Change: No acute ST elevation.  ____________________________________________  RADIOLOGY  CXR IMPRESSION: Enlargement of cardiac silhouette post CABG with pulmonary vascular congestion and question mild pulmonary edema.  Moderate LEFT pleural effusion and and basilar atelectasis new since prior exam.  ____________________________________________   PROCEDURES  Procedure(s) performed: None  Critical Care performed: No  ____________________________________________   INITIAL IMPRESSION / ASSESSMENT AND PLAN / ED COURSE  Pertinent labs & imaging results that were available during my care of the patient were reviewed by me and considered in my medical decision making (see chart for details).  [name] is a 81 y.o. male with hypertension, coronary artery disease, atrial fibrillation on Coumadin, stage IV lung cancer with metastasis to bone and abdominal wall on Kaytruda and followed by Dr. Oliva Bustard, hoarse voice secondary to left vocal cord paralysis who presents for evaluation of generalized weakness, hypotension, lightheadedness at [facility] today. On arrival to the emergency department, he is chronically ill-appearing, hypotensive with a blood pressure in the 80s, heart rate in the 90s. He was mildly dyspneic and is mildly tachypnea but maintaining adequate oxygen saturation. Plan  for screening labs, EKG, we'll give IV fluids, obtain chest x-ray. Reassess for disposition.  ----------------------------------------- 5:59 PM on 11/22/2015 -----------------------------------------  Labs reviewed are notable for mild anemia with hemoglobin 11.3. The patient is noted to be in acute renal failure with creatinine 2.5, BUN 41. Additionally chest x-rays concerning for volume overload with mild pulmonary edema. As the patient is clearly volume depleted, we'll continue IV fluids but we will go slowly. Case discussed with the hospitalist, Dr. Geryl Councilman, for admission at this time. ____________________________________________   FINAL CLINICAL IMPRESSION(S) / ED DIAGNOSES  Final diagnoses:  Hypotension, unspecified  Weakness  SOB (shortness of breath)  Acute renal failure, unspecified acute renal failure type (HCC)  Congestive heart failure, unspecified congestive heart failure chronicity, unspecified congestive heart failure type (HCC)      Joanne Gavel, MD 11/22/15 [phone]

## 2015-11-22 NOTE — Progress Notes (Signed)
[place]  [phone]) [phone]  [phone]     [name] DOB: 1934/11/29  MR#: [id number]  LOV#:[id number]  Patient Care Team: Pcp Not In System as PCP - General Carloyn Manner, MD as Referring Physician (Otolaryngology)  CHIEF COMPLAINT:  Chief Complaint  Patient presents with  . Lung Cancer    INTERVAL HISTORY:  Patient is seen as an acute add on regarding weakness, poor by mouth intake, dizziness, ringing in ears, shortness of breath, and otherwise overall feeling poorly. When evaluated in the office patient's blood pressure was reported as 88/64 with a heart rate of 116. On reevaluation with a manual blood pressure has BP and right arm was 84/44, heart rate 110. He reports being increasingly short of breath, O2 saturations are 98% on room air.   REVIEW OF SYSTEMS:   Review of Systems  Constitutional: Positive for weight loss and malaise/fatigue. Negative for fever, chills and diaphoresis.  HENT: Positive for congestion and tinnitus.   Eyes: Negative.   Respiratory: Negative for cough, hemoptysis, sputum production, shortness of breath and wheezing.   Cardiovascular: Negative for chest pain, palpitations, orthopnea, claudication, leg swelling and PND.  Gastrointestinal: Negative for heartburn, nausea, vomiting, abdominal pain, diarrhea, constipation, blood in stool and melena.  Genitourinary: Negative.   Musculoskeletal: Negative.   Skin: Negative.   Neurological: Positive for dizziness and weakness. Negative for focal weakness and seizures.  Endo/Heme/Allergies: Does not bruise/bleed easily.  Psychiatric/Behavioral: Negative for depression. The patient is not nervous/anxious and does not have insomnia.     As per HPI. Otherwise, a complete review of systems is negatve.  ONCOLOGY HISTORY:  No history exists.    PAST MEDICAL HISTORY: Past Medical History  Diagnosis Date  . Hypertension   . Coronary artery disease   . Atrial  fibrillation ([place]) 07/2015  . Primary cancer of left upper lobe of lung ([place]) 10/06/2015    PAST SURGICAL HISTORY: Past Surgical History  Procedure Laterality Date  . Coronary artery bypass graft  12/10/2005    LIMA to LAD,SVG to diagonal,SVG to ramus intermediate,SVG to distal RCA.  . Tonsillectomy    . Tee without cardioversion  05/10/2012    Procedure: TRANSESOPHAGEAL ECHOCARDIOGRAM (TEE);  Surgeon: [name], MD;  Location: [facility] ENDOSCOPY;  Service: Cardiovascular;  Laterality: N/A;  . Cardioversion  05/10/2012    Procedure: CARDIOVERSION;  Surgeon: [name], MD;  Location: [facility] ENDOSCOPY;  Service: Cardiovascular;  Laterality: N/A;  . Cardiac catheterization  10/28/2005    significant 3 vessel CAD  . Nm myocar perf wall motion  09/16/2005    evidence of diaphragmatic attenuation & subtle anterolateral ischemia.  . Cardioversion N/A 07/31/2015    Procedure: CARDIOVERSION;  Surgeon: Josue Hector, MD;  Location: [facility] ENDOSCOPY;  Service: Cardiovascular;  Laterality: N/A;  . Peripheral vascular catheterization N/A 10/18/2015    Procedure: Glori Luis Cath Insertion;  Surgeon: Algernon Huxley, MD;  Location: [facility] CV LAB;  Service: Cardiovascular;  Laterality: N/A;    FAMILY HISTORY Family History  Problem Relation Age of Onset  . Stroke Mother 71  . Stroke Father 73  . Heart attack Father 39  . Heart attack Brother 71  . Cancer Sister 38    GYNECOLOGIC HISTORY:  No LMP for male patient.     ADVANCED DIRECTIVES:    HEALTH MAINTENANCE: Social History  Substance Use Topics  . Smoking status: Former Smoker -- 1.00 packs/day for 40 years    Types: Cigarettes  Quit date: 09/19/2005  . Smokeless tobacco: Never Used  . Alcohol Use: No     Colonoscopy:  PAP:  Bone density:  Mammogram:  Allergies  Allergen Reactions  . Clonidine Derivatives Other (See Comments)    Pt was not aware of a reaction to this medication  . Diltiazem Hcl Er Other (See Comments)     High blood pressure  . Statins Other (See Comments)    Myalgias:  Previously on Crestor,Vytorin,Simvastatin,Lipitor & Pravastatin  . Sulfa Antibiotics Swelling    Current Outpatient Prescriptions  Medication Sig Dispense Refill  . aspirin EC 81 MG tablet Take 81 mg by mouth daily.    . fentaNYL (DURAGESIC - DOSED MCG/HR) 12 MCG/HR Place 1 patch (12.5 mcg total) onto the skin every 3 (three) days. 10 patch 0  . guaiFENesin (MUCINEX) 600 MG 12 hr tablet Take 1 tablet (600 mg total) by mouth 2 (two) times daily. 60 tablet 0  . HYDROcodone-acetaminophen (NORCO/VICODIN) 5-325 MG tablet Take 1 tablet by mouth every 6 (six) hours as needed for moderate pain. 60 tablet 0  . ipratropium-albuterol (DUONEB) 0.5-2.5 (3) MG/3ML SOLN Take 3 mLs by nebulization every 4 (four) hours as needed. 360 mL 3  . lidocaine-prilocaine (EMLA) cream Apply 1 application topically as needed. 30 g 2  . lisinopril (PRINIVIL,ZESTRIL) 10 MG tablet Take 1 tablet (10 mg total) by mouth daily. 90 tablet 3  . LORazepam (ATIVAN) 0.5 MG tablet Take 0.5 tablets (0.25 mg total) by mouth 2 (two) times daily. 60 tablet 0  . metoprolol succinate (TOPROL-XL) 50 MG 24 hr tablet Take 1 tablet (50 mg total) by mouth daily. 90 tablet 3  . mirtazapine (REMERON) 15 MG tablet Take 1 tablet (15 mg total) by mouth at bedtime. 15 tablet 0  . omeprazole (PRILOSEC) 20 MG capsule Take 1 capsule (20 mg total) by mouth 2 (two) times daily before a meal. 60 capsule 3  . ondansetron (ZOFRAN) 4 MG tablet Take 1 tablet (4 mg total) by mouth every 8 (eight) hours as needed for nausea or vomiting. 60 tablet 1  . oxyCODONE-acetaminophen (PERCOCET/ROXICET) 5-325 MG tablet Take 1-2 tablets by mouth every 4 (four) hours as needed for moderate pain. 30 tablet 0  . predniSONE (DELTASONE) 20 MG tablet Take 1 tablet (20 mg total) by mouth daily with breakfast. 30 tablet 3  . Tamsulosin HCl (FLOMAX) 0.4 MG CAPS Take 0.4 mg by mouth daily after supper.    . warfarin  (COUMADIN) 5 MG tablet 0.5 tablet (2.'5mg'$ ) by mouth 5 days a week. Starting on Monday 10/3015. 90 tablet 0   No current facility-administered medications for this visit.    OBJECTIVE: BP 84/42 mmHg  Pulse 110  Temp(Src) 96.7 F (35.9 C) (Tympanic)  Resp 116  Wt 176 lb 8 oz (80.06 kg)   Body mass index is 28.5 kg/(m^2).    ECOG FS:2 - Symptomatic, <50% confined to bed  General: thin appearing, elderly gentleman in mild respiratory distress. Eyes: Pink conjunctiva, anicteric sclera. HEENT: Normocephalic, dry mucous membranes, clear oropharnyx. Lungs: Diminished bilaterally. Heart:  tachycardia, no murmur or gallops present Musculoskeletal: No edema, cyanosis, or clubbing. Neuro: Alert, answering all questions appropriately. Cranial nerves grossly intact. Skin: No rashes or petechiae noted. Psych: Normal affect. Lymphatics: No cervical, clavicular, or axillary LAD.   LAB RESULTS:  Appointment on 11/22/2015  Component Date Value Ref Range Status  . WBC 11/22/2015 9.1  3.8 - 10.6 K/uL Final  . RBC 11/22/2015 4.76  4.40 -  5.90 MIL/uL Final  . Hemoglobin 11/22/2015 12.7* 13.0 - 18.0 g/dL Final  . HCT 11/22/2015 38.3* 40.0 - 52.0 % Final  . MCV 11/22/2015 80.6  80.0 - 100.0 fL Final  . MCH 11/22/2015 26.7  26.0 - 34.0 pg Final  . MCHC 11/22/2015 33.1  32.0 - 36.0 g/dL Final  . RDW 11/22/2015 17.1* 11.5 - 14.5 % Final  . Platelets 11/22/2015 196  150 - 440 K/uL Final  . Neutrophils Relative % 11/22/2015 83   Final  . Neutro Abs 11/22/2015 7.5* 1.4 - 6.5 K/uL Final  . Lymphocytes Relative 11/22/2015 10   Final  . Lymphs Abs 11/22/2015 0.9* 1.0 - 3.6 K/uL Final  . Monocytes Relative 11/22/2015 6   Final  . Monocytes Absolute 11/22/2015 0.5  0.2 - 1.0 K/uL Final  . Eosinophils Relative 11/22/2015 0   Final  . Eosinophils Absolute 11/22/2015 0.0  0 - 0.7 K/uL Final  . Basophils Relative 11/22/2015 1   Final  . Basophils Absolute 11/22/2015 0.1  0 - 0.1 K/uL Final    STUDIES: No  results found.  ASSESSMENT:  Poorly differentiated carcinoma of the lung, T2 N2 M1, stage IV disease with metastasis to the bone and abdominal wall. Hypotension.  PLAN:  1. Lung cancer. Patient was most recently treated with Keytruda on 11/15/2015, cycle 1 day 1. Patient had previously been treated with carboplatin and Taxol for 2 cycles only, due to poor tolerance.   2. Hypotension. Patient's blood pressure today 84/44 manually and right arm. Patient is tachycardic with a pulse of 110 as well as tachypnea with oxygen saturation of 98% on room air.  Patient was evaluated also by Dr. Oliva Bustard, determined that his best route for treatment would be to be seen in the emergency room. Report was called to [place].  An appointment has been made for Monday, February 6 for patient to see Dr. Oliva Bustard and have follow-up labs and possible IV fluids if necessary. Patient expressed understanding and was in agreement with this plan. He also understands that He can call clinic at any time with any questions, concerns, or complaints.   Primary cancer of left upper lobe of lung ([place])   Staging form: Lung, AJCC 7th Edition     Clinical: Stage IV (T2a, N2, M1b) - Signed by Forest Gleason, MD on 10/06/2015   Evlyn Kanner, NP   11/22/2015 3:37 PM

## 2015-11-23 ENCOUNTER — Other Ambulatory Visit: Payer: Self-pay | Admitting: Family Medicine

## 2015-11-23 DIAGNOSIS — R0602 Shortness of breath: Secondary | ICD-10-CM

## 2015-11-23 DIAGNOSIS — R791 Abnormal coagulation profile: Secondary | ICD-10-CM | POA: Diagnosis present

## 2015-11-23 DIAGNOSIS — E860 Dehydration: Secondary | ICD-10-CM

## 2015-11-23 DIAGNOSIS — R5383 Other fatigue: Secondary | ICD-10-CM

## 2015-11-23 DIAGNOSIS — C792 Secondary malignant neoplasm of skin: Secondary | ICD-10-CM

## 2015-11-23 DIAGNOSIS — E630 Essential fatty acid [EFA] deficiency: Secondary | ICD-10-CM

## 2015-11-23 DIAGNOSIS — R634 Abnormal weight loss: Secondary | ICD-10-CM

## 2015-11-23 DIAGNOSIS — I959 Hypotension, unspecified: Secondary | ICD-10-CM

## 2015-11-23 DIAGNOSIS — C7951 Secondary malignant neoplasm of bone: Secondary | ICD-10-CM

## 2015-11-23 DIAGNOSIS — Z9221 Personal history of antineoplastic chemotherapy: Secondary | ICD-10-CM

## 2015-11-23 DIAGNOSIS — N179 Acute kidney failure, unspecified: Principal | ICD-10-CM

## 2015-11-23 DIAGNOSIS — C3412 Malignant neoplasm of upper lobe, left bronchus or lung: Secondary | ICD-10-CM

## 2015-11-23 LAB — URINALYSIS COMPLETE WITH MICROSCOPIC (ARMC ONLY)
Bilirubin Urine: NEGATIVE
GLUCOSE, UA: NEGATIVE mg/dL
HGB URINE DIPSTICK: NEGATIVE
LEUKOCYTES UA: NEGATIVE
NITRITE: NEGATIVE
Protein, ur: NEGATIVE mg/dL
SPECIFIC GRAVITY, URINE: 1.02 (ref 1.005–1.030)
pH: 5 (ref 5.0–8.0)

## 2015-11-23 LAB — APTT: APTT: 72 s — AB (ref 24–36)

## 2015-11-23 LAB — CBC
HEMATOCRIT: 34.8 % — AB (ref 40.0–52.0)
Hemoglobin: 11.4 g/dL — ABNORMAL LOW (ref 13.0–18.0)
MCH: 26.9 pg (ref 26.0–34.0)
MCHC: 32.8 g/dL (ref 32.0–36.0)
MCV: 82 fL (ref 80.0–100.0)
PLATELETS: 168 10*3/uL (ref 150–440)
RBC: 4.25 MIL/uL — ABNORMAL LOW (ref 4.40–5.90)
RDW: 17.1 % — AB (ref 11.5–14.5)
WBC: 6.4 10*3/uL (ref 3.8–10.6)

## 2015-11-23 LAB — BASIC METABOLIC PANEL
Anion gap: 8 (ref 5–15)
BUN: 39 mg/dL — AB (ref 6–20)
CHLORIDE: 108 mmol/L (ref 101–111)
CO2: 17 mmol/L — ABNORMAL LOW (ref 22–32)
CREATININE: 2.21 mg/dL — AB (ref 0.61–1.24)
Calcium: 7.1 mg/dL — ABNORMAL LOW (ref 8.9–10.3)
GFR calc Af Amer: 31 mL/min — ABNORMAL LOW (ref >60)
GFR calc non Af Amer: 27 mL/min — ABNORMAL LOW (ref >60)
GLUCOSE: 111 mg/dL — AB (ref 65–99)
POTASSIUM: 4.6 mmol/L (ref 3.5–5.1)
SODIUM: 133 mmol/L — AB (ref 135–145)

## 2015-11-23 LAB — PROTIME-INR
INR: 5.38
Prothrombin Time: 47.5 seconds — ABNORMAL HIGH (ref 11.4–15.0)

## 2015-11-23 MED ORDER — MORPHINE SULFATE (PF) 2 MG/ML IV SOLN
2.0000 mg | Freq: Once | INTRAVENOUS | Status: AC
  Administered 2015-11-23: 2 mg via INTRAVENOUS
  Filled 2015-11-23: qty 1

## 2015-11-23 MED ORDER — IPRATROPIUM-ALBUTEROL 0.5-2.5 (3) MG/3ML IN SOLN
3.0000 mL | RESPIRATORY_TRACT | Status: DC | PRN
  Administered 2015-11-23 – 2015-11-28 (×5): 3 mL via RESPIRATORY_TRACT
  Filled 2015-11-23 (×5): qty 3

## 2015-11-23 NOTE — Progress Notes (Signed)
Received critical lab of INR 5.38.  Paged Dr. Estanislado Pandy who ordered discontinue of Heparin.  All anticoagulents on hold.

## 2015-11-23 NOTE — Progress Notes (Signed)
Initial Nutrition Assessment  DOCUMENTATION CODES:   Severe malnutrition in context of chronic illness  INTERVENTION:   Meals and Snacks: Cater to patient preferences; pt prefers soft diet order but not interested in chopped meats Medical Food Supplement Therapy: pt only likes Dark chocolate Ensure which we do not house at [facility] (we have Milk chocolate). Family encouraged to bring in if they have at home. Will send Magic Cup TID as pt likes ice cream.   NUTRITION DIAGNOSIS:   Malnutrition related to chronic illness as evidenced by energy intake < or equal to 75% for > or equal to 1 month, percent weight loss, moderate depletions of muscle mass.  GOAL:   Patient will meet greater than or equal to 90% of their needs  MONITOR:    (Energy Intake, Electrolyte and renal Profile, Pulmonary Profile, Anthropometrics, Digestive System)  REASON FOR ASSESSMENT:   Malnutrition Screening Tool    ASSESSMENT:   Pt admitted with acute renal failure and SOB. Pt with h/o stage IV lung cancer with known mets.  Past Medical History  Diagnosis Date  . Hypertension   . Coronary artery disease   . Atrial fibrillation ([place]) 07/2015  . Primary cancer of left upper lobe of lung ([place]) 10/06/2015     Diet Order:  DIET SOFT Room service appropriate?: Yes; Fluid consistency:: Thin    Current Nutrition: Pt reports not eating lunch and having bites of breakfast this am. RD got vanilla ice cream for pt on visit. Pt reports liking applesauce.   Food/Nutrition-Related History: Pt reports very poor po intake for months. Wife reports pt eats bites of meals but will try to drink 1-2 Ensures daily.    Scheduled Medications:  . aspirin EC  81 mg Oral Daily  . guaiFENesin  600 mg Oral BID  . LORazepam  0.25 mg Oral BID  . tamsulosin  0.4 mg Oral QPC supper    Continuous Medications:  . dextrose 5 % and 0.9 % NaCl with KCl 20 mEq/L 75 mL/hr at 11/22/15 2326   D5 at 12m/hr to provide 306kcals in 24  hours   Electrolyte/Renal Profile and Glucose Profile:   Recent Labs Lab 11/22/15 1638 11/23/15 0529  NA 133* 133*  K 4.7 4.6  CL 104 108  CO2 19* 17*  BUN 41* 39*  CREATININE 2.50* 2.21*  CALCIUM 7.7* 7.1*  MG 2.0  --   GLUCOSE 104* 111*   Protein Profile:  Recent Labs Lab 11/22/15 1638  ALBUMIN 2.5*    Gastrointestinal Profile: Last BM:  11/22/2015   Nutrition-Focused Physical Exam Findings: Nutrition-Focused physical exam completed. Findings are no fat depletion, mild-moderate muscle depletion, and no edema.     Weight Change: Pt wife reports pt has lost 40lbs since November 2016 (17% weight loss in 3 months)  Last BM:  11/22/2015  Height:   Ht Readings from Last 1 Encounters:  11/22/15 '5\' 6"'$  (1.676 m)    Weight:   Wt Readings from Last 1 Encounters:  11/22/15 182 lb 12.8 oz (82.918 kg)   Wt Readings from Last 10 Encounters:  11/22/15 182 lb 12.8 oz (82.918 kg)  11/22/15 176 lb 8 oz (80.06 kg)  11/15/15 178 lb 4 oz (80.854 kg)  11/11/15 167 lb (75.751 kg)  11/08/15 179 lb 5 oz (81.336 kg)  11/02/15 187 lb 9.8 oz (85.1 kg)  10/23/15 191 lb 9.3 oz (86.9 kg)  10/18/15 183 lb (83.008 kg)  10/16/15 188 lb 7.9 oz (85.5 kg)  10/08/15 194  lb 0.1 oz (88 kg)    BMI:  Body mass index is 29.52 kg/(m^2).  Estimated Nutritional Needs:   Kcal:  BEE: 1472kcals, TEE: (IF 1.1-1.3)(AF 1.2) 1944-2297kcals  Protein:  90-107g protein (1.1-1.3g/kg)  Fluid:  2050-2444m of fluid (25-321mkg)  EDUCATION NEEDS:   No education needs identified at this time   HIRobinson MillRD, LDN Pager [phone]/On-Call Pager ([phone]

## 2015-11-23 NOTE — Progress Notes (Addendum)
[name] at [place] NAME: [name]    MR#:  [id number]  DATE OF BIRTH:  July 22, 1935  SUBJECTIVE:  Patient admitted 11/22/15 with complaints of weakness, orthostasis. On presentation to the hospital noted to be hypotensive with acute kidney injury. Then placed on IV fluids. Overnight no complaints denies any shortness of breath complains only of weakness  REVIEW OF SYSTEMS:  CONSTITUTIONAL: No fever, positive fatigue or weakness.  EYES: No blurred or double vision.  EARS, NOSE, AND THROAT: No tinnitus or ear pain.  RESPIRATORY: No cough, positive shortness of breath, denies wheezing or hemoptysis.  CARDIOVASCULAR: No chest pain, orthopnea, edema.  GASTROINTESTINAL: No nausea, vomiting, diarrhea or abdominal pain.  GENITOURINARY: No dysuria, hematuria.  ENDOCRINE: No polyuria, nocturia,  HEMATOLOGY: No anemia, easy bruising or bleeding SKIN: No rash or lesion. MUSCULOSKELETAL: No joint pain or arthritis.   NEUROLOGIC: No tingling, numbness, weakness.  PSYCHIATRY: No anxiety or depression.   DRUG ALLERGIES:   Allergies  Allergen Reactions  . Clonidine Derivatives Other (See Comments)    Pt was not aware of a reaction to this medication  . Diltiazem Hcl Er Other (See Comments)    High blood pressure  . Statins Other (See Comments)    Myalgias:  Previously on Crestor,Vytorin,Simvastatin,Lipitor & Pravastatin  . Sulfa Antibiotics Swelling    VITALS:  Blood pressure 108/66, pulse 106, temperature 97.2 F (36.2 C), temperature source Oral, resp. rate 21, height '5\' 6"'$  (1.676 m), weight 82.918 kg (182 lb 12.8 oz), SpO2 97 %.  PHYSICAL EXAMINATION:  VITAL SIGNS: Filed Vitals:   11/22/15 2140 11/23/15 0458  BP: 94/76 108/66  Pulse: 99 106  Temp: 97.8 F (36.6 C) 97.2 F (36.2 C)  Resp: 20 21   GENERAL:81 y.o.male currently in no acute distress. Chronically ill appearing HEAD: Normocephalic, atraumatic.  EYES: Pupils  equal, round, reactive to light. Extraocular muscles intact. No scleral icterus.  MOUTH: Dry mucosal membrane. Dentition poor. No abscess noted.  EAR, NOSE, THROAT: Clear without exudates. No external lesions.  NECK: Supple. No thyromegaly. No nodules. No JVD.  PULMONARY: Diffuse expiratory wheezing noted No use of accessory muscles, Good respiratory effort. good air entry bilaterally CHEST: Nontender to palpation.  CARDIOVASCULAR: S1 and S2. iRegular rate and rhythm. No murmurs, rubs, or gallops. No edema. Pedal pulses 2+ bilaterally.  GASTROINTESTINAL: Soft, nontender, nondistended. No masses. Positive bowel sounds. No hepatosplenomegaly.  MUSCULOSKELETAL: No swelling, clubbing, or edema. Range of motion full in all extremities.  NEUROLOGIC: Cranial nerves II through XII are intact. No gross focal neurological deficits. Sensation intact. Reflexes intact.  SKIN: No ulceration, lesions, rashes, or cyanosis. Skin warm and dry. Turgor intact.  PSYCHIATRIC: Mood, affect within normal limits. The patient is awake, alert and oriented x 3. Insight, judgment intact.      LABORATORY PANEL:   CBC  Recent Labs Lab 11/23/15 0529  WBC 6.4  HGB 11.4*  HCT 34.8*  PLT 168   ------------------------------------------------------------------------------------------------------------------  Chemistries   Recent Labs Lab 11/22/15 1638 11/23/15 0529  NA 133* 133*  K 4.7 4.6  CL 104 108  CO2 19* 17*  GLUCOSE 104* 111*  BUN 41* 39*  CREATININE 2.50* 2.21*  CALCIUM 7.7* 7.1*  MG 2.0  --   AST 26  --   ALT 29  --   ALKPHOS 103  --   BILITOT 0.7  --    ------------------------------------------------------------------------------------------------------------------  Cardiac Enzymes  Recent Labs Lab 11/22/15 1638  TROPONINI <  0.03   ------------------------------------------------------------------------------------------------------------------  RADIOLOGY:  Dg Chest Portable 1  View  11/22/2015  CLINICAL DATA:  Weakness, low blood pressure, dehydration, lung cancer, hypertension, coronary artery disease EXAM: PORTABLE CHEST 1 VIEW COMPARISON:  Portable exam 1632 hours compared to 07/29/2015 FINDINGS: RIGHT jugular Port-A-Cath with tip projecting over SVC. Enlargement of cardiac silhouette post CABG with pulmonary vascular congestion. New moderate LEFT pleural effusion and basilar atelectasis. Minimal RIGHT basilar atelectasis Question perihilar edema. No pneumothorax or acute osseous findings. IMPRESSION: Enlargement of cardiac silhouette post CABG with pulmonary vascular congestion and question mild pulmonary edema. Moderate LEFT pleural effusion and and basilar atelectasis new since prior exam. Electronically Signed   By: Lavonia Dana M.D.   On: 11/22/2015 16:45    EKG:   Orders placed or performed during the hospital encounter of 11/22/15  . ED EKG  . ED EKG    ASSESSMENT AND PLAN:   81 year old Caucasian gentleman history of stage IV lung cancer known metastases admitted to the hospital 11/22/15 with weakness hypotension acute kidney injury  1. Acute renal failure likely prerenal versus ATN, IV fluid hydration and follow urine output renal function, avoid further nephrotoxic agents  2. Supratherapeutic INR: Hold warfarin, consult pharmacy for management 3. Stage IV lung cancer: Continue oxygen, add DuoNeb treatments, oncology consult pending follow-up with [place] 4.  Chronic Atrial fibrillation: Rate controlled supratherapeutic INR 5. Venous thromboembolism prophylactic: Supratherapeutic warfarin  Disposition: Consult physical therapy social worker anticipated placement   All the records are reviewed and case discussed with Care Management/Social Workerr. Management plans discussed with the patient, family and they are in agreement.  CODE STATUS: DO NOT RESUSCITATE  TOTAL TIME TAKING CARE OF THIS PATIENT: 35 minutes.   POSSIBLE D/C IN 2-3 DAYS, DEPENDING ON  CLINICAL CONDITION.   [name],  [name] on 11/23/2015 at 1:12 PM  Between 7am to 6pm - Pager - [phone]  After 6pm: House Pager: - [phone]  [name]  Office  [phone]  CC: Primary care physician; Pcp Not In System

## 2015-11-23 NOTE — Progress Notes (Signed)
[facility]  Date of admission:  11/22/2015  Inpatient day:  11/23/2015  Consulting physician:  Valentino Nose, MD   Reason for Consultation:  Lung cancer  Chief Complaint: [name] is a 81 y.o. male with stage IV lung cancer who was admitted with hypotension and acute renal failure.  HPI:  The patient presented with initial discomfort in the chest. He then developed a dry hacking cough and hoarseness.  He was seen by Dr. Pryor Ochoa of ENT and discovered to have left vocal cord paralysis.  Chest CT in 08/2015 revealed a 3.2 x 2.5 cm left upper lobe lung mass.  There was ipsilateral peribronchial and ipsilateral hilar lymphadenopathy.PET scan revealed multiple bone metastases. Biopsy of an abdominal wall nodule on 09/25/2015 revealed poorly differentiated carcinoma(CK 7 positive, TTF negative) clinically consistent with lung primary.  PDL 10 was more than 50%.  He received 1 cycle of carboplatin and Taxol on 11/02/2015.  He did not tolerate it well.  He received cycle #1 Keytruda on 11/15/2015.  He was seen in the medical oncology clinic on 11/22/2015 as a sick call visit with complaints of poor oral intake, dizziness, ringing in the ears, shortness of breath, and feeling poorly.  Blood pressure was 84/44 and tachycardic.  He was triaged to the emergency room.  He was noted to be in acute renal failure likely secondary to dehydration.  Symptomatically, he notes poor appetite and subsequent oral intake. He has lost 39 pounds since initial presentation with his lung cancer.  Shortness of breath waxes and wanes.  Past Medical History  Diagnosis Date  . Hypertension   . Coronary artery disease   . Atrial fibrillation ([place]) 07/2015  . Primary cancer of left upper lobe of lung ([place]) 10/06/2015    Past Surgical History  Procedure Laterality Date  . Coronary artery bypass graft  12/10/2005    LIMA to LAD,SVG to diagonal,SVG to ramus intermediate,SVG to distal RCA.  .  Tonsillectomy    . Tee without cardioversion  05/10/2012    Procedure: TRANSESOPHAGEAL ECHOCARDIOGRAM (TEE);  Surgeon: [name], MD;  Location: [facility] ENDOSCOPY;  Service: Cardiovascular;  Laterality: N/A;  . Cardioversion  05/10/2012    Procedure: CARDIOVERSION;  Surgeon: [name], MD;  Location: [facility] ENDOSCOPY;  Service: Cardiovascular;  Laterality: N/A;  . Cardiac catheterization  10/28/2005    significant 3 vessel CAD  . Nm myocar perf wall motion  09/16/2005    evidence of diaphragmatic attenuation & subtle anterolateral ischemia.  . Cardioversion N/A 07/31/2015    Procedure: CARDIOVERSION;  Surgeon: Josue Hector, MD;  Location: [facility] ENDOSCOPY;  Service: Cardiovascular;  Laterality: N/A;  . Peripheral vascular catheterization N/A 10/18/2015    Procedure: Glori Luis Cath Insertion;  Surgeon: Algernon Huxley, MD;  Location: [place] CV LAB;  Service: Cardiovascular;  Laterality: N/A;    Family History  Problem Relation Age of Onset  . Stroke Mother 84  . Stroke Father 45  . Heart attack Father 42  . Heart attack Brother 41  . Cancer Sister 32    Social History:  reports that he quit smoking about 10 years ago. His smoking use included Cigarettes. He has a 40 pack-year smoking history. He has never used smokeless tobacco. He reports that he does not drink alcohol or use illicit drugs.  The patient is accompanied by his grandson and great grand daughter.  Allergies:  Allergies  Allergen Reactions  . Clonidine Derivatives Other (See Comments)  Pt was not aware of a reaction to this medication  . Diltiazem Hcl Er Other (See Comments)    High blood pressure  . Statins Other (See Comments)    Myalgias:  Previously on Crestor,Vytorin,Simvastatin,Lipitor & Pravastatin  . Sulfa Antibiotics Swelling    Medications Prior to Admission  Medication Sig Dispense Refill  . aspirin EC 81 MG tablet Take 81 mg by mouth daily.    Marland Kitchen guaiFENesin (MUCINEX) 600 MG 12 hr tablet Take 1 tablet  (600 mg total) by mouth 2 (two) times daily. 60 tablet 0  . HYDROcodone-acetaminophen (NORCO/VICODIN) 5-325 MG tablet Take 1 tablet by mouth every 6 (six) hours as needed for moderate pain. 60 tablet 0  . ipratropium-albuterol (DUONEB) 0.5-2.5 (3) MG/3ML SOLN Take 3 mLs by nebulization every 4 (four) hours as needed. (Patient taking differently: Take 3 mLs by nebulization every 4 (four) hours as needed (for shortness of breath). ) 360 mL 3  . lidocaine-prilocaine (EMLA) cream Apply 1 application topically as needed. (Patient taking differently: Apply 1 application topically as needed (before treatment). ) 30 g 2  . lisinopril (PRINIVIL,ZESTRIL) 10 MG tablet Take 1 tablet (10 mg total) by mouth daily. (Patient taking differently: Take 10 mg by mouth daily as needed (for high blood pressure). ) 90 tablet 3  . LORazepam (ATIVAN) 0.5 MG tablet Take 0.5 tablets (0.25 mg total) by mouth 2 (two) times daily. 60 tablet 0  . metoprolol succinate (TOPROL-XL) 50 MG 24 hr tablet Take 1 tablet (50 mg total) by mouth daily. 90 tablet 3  . mirtazapine (REMERON) 15 MG tablet Take 1 tablet (15 mg total) by mouth at bedtime. (Patient taking differently: Take 15 mg by mouth at bedtime as needed (for sleep). ) 15 tablet 0  . ondansetron (ZOFRAN) 4 MG tablet Take 1 tablet (4 mg total) by mouth every 8 (eight) hours as needed for nausea or vomiting. 60 tablet 1  . oxyCODONE-acetaminophen (PERCOCET/ROXICET) 5-325 MG tablet Take 1-2 tablets by mouth every 4 (four) hours as needed for moderate pain. 30 tablet 0  . Tamsulosin HCl (FLOMAX) 0.4 MG CAPS Take 0.4 mg by mouth daily after supper.    . warfarin (COUMADIN) 5 MG tablet 0.5 tablet (2.'5mg'$ ) by mouth 5 days a week. Starting on Monday 10/3015. (Patient taking differently: Take 2.5 mg by mouth every other day. In the evening) 90 tablet 0  . fentaNYL (DURAGESIC - DOSED MCG/HR) 12 MCG/HR Place 1 patch (12.5 mcg total) onto the skin every 3 (three) days. (Patient not taking:  Reported on 11/22/2015) 10 patch 0  . omeprazole (PRILOSEC) 20 MG capsule Take 1 capsule (20 mg total) by mouth 2 (two) times daily before a meal. (Patient not taking: Reported on 11/22/2015) 60 capsule 3  . predniSONE (DELTASONE) 20 MG tablet Take 1 tablet (20 mg total) by mouth daily with breakfast. (Patient not taking: Reported on 11/22/2015) 30 tablet 3    Review of Systems: GENERAL:  Fatigue.  No fevers or sweats.  Notes 39 pound weight loss since diagnosis. PERFORMANCE STATUS (ECOG):  2 HEENT:  Vocal cord paralysis/  No visual changes, runny nose, sore throat, mouth sores or tenderness. Lungs:  Shortness of breath.  No cough.  No hemoptysis. Cardiac:  No chest pain, palpitations, orthopnea, or PND. GI:  Poor appetite.  No nausea, vomiting, diarrhea, constipation, melena or hematochezia. GU:  Decreased fluid intake.  No urgency, frequency, dysuria, or hematuria. Musculoskeletal:  No back pain.  No joint pain.  No muscle  tenderness. Extremities:  No pain or swelling. Skin:  No rashes or skin changes. Neuro:  No headache, numbness or weakness, balance or coordination issues. Endocrine:  No diabetes, thyroid issues, hot flashes or night sweats. Psych:  No mood changes, depression or anxiety. Pain:  No focal pain. Review of systems:  All other systems reviewed and found to be negative.  Physical Exam:  Blood pressure 131/75, pulse 108, temperature 97.8 F (36.6 C), temperature source Oral, resp. rate 18, height '5\' 6"'$  (1.676 m), weight 182 lb 12.8 oz (82.918 kg), SpO2 94 %.  GENERAL:  Well developed, well nourished, sitting comfortably on the medical unit in no acute distress.  He speaks in partial sentences secondary to vocal cord paralysis. MENTAL STATUS:  Alert and oriented to person, place and time. HEAD:  Pearline Cables hair.  Normocephalic, atraumatic, face symmetric, no Cushingoid features. EYES:  Blue eyes.  Pupils equal round and reactive to light and accomodation.  No conjunctivitis or scleral  icterus. ENT:  Oropharynx clear without lesion.  Tongue normal. Mucous membranes moist.  RESPIRATORY:  Decreased respiratory excursion.  Decreased breath sounds LLL.  Fine crackles RLL.  No wheezes or rhonchi. CARDIOVASCULAR:  Irregular rate and rhythm without murmur, rub or gallop. ABDOMEN:  Soft, non-tender, with active bowel sounds, and no hepatosplenomegaly.  No masses. SKIN:  No rashes, ulcers or lesions. EXTREMITIES: Bilateral 1+ lower extremity edema (left > right).  No skin discoloration or tenderness.  No palpable cords. LYMPH NODES: No palpable cervical, supraclavicular, axillary or inguinal adenopathy  NEUROLOGICAL: Unremarkable. PSYCH:  Appropriate.  Results for orders placed or performed during the hospital encounter of 11/22/15 (from the past 48 hour(s))  Urinalysis complete, with microscopic ([facility] only)     Status: Abnormal   Collection Time: 11/22/15  5:45 AM  Result Value Ref Range   Color, Urine YELLOW (A) YELLOW   APPearance CLEAR (A) CLEAR   Glucose, UA NEGATIVE NEGATIVE mg/dL   Bilirubin Urine NEGATIVE NEGATIVE   Ketones, ur TRACE (A) NEGATIVE mg/dL   Specific Gravity, Urine 1.020 1.005 - 1.030   Hgb urine dipstick NEGATIVE NEGATIVE   pH 5.0 5.0 - 8.0   Protein, ur NEGATIVE NEGATIVE mg/dL   Nitrite NEGATIVE NEGATIVE   Leukocytes, UA NEGATIVE NEGATIVE   RBC / HPF 0-5 0 - 5 RBC/hpf   WBC, UA 0-5 0 - 5 WBC/hpf   Bacteria, UA RARE (A) NONE SEEN   Squamous Epithelial / LPF 0-5 (A) NONE SEEN   Mucous PRESENT    Hyaline Casts, UA PRESENT   CBC with Differential     Status: Abnormal   Collection Time: 11/22/15  4:38 PM  Result Value Ref Range   WBC 7.4 3.8 - 10.6 K/uL   RBC 4.34 (L) 4.40 - 5.90 MIL/uL   Hemoglobin 11.3 (L) 13.0 - 18.0 g/dL   HCT 34.6 (L) 40.0 - 52.0 %   MCV 79.7 (L) 80.0 - 100.0 fL   MCH 26.2 26.0 - 34.0 pg   MCHC 32.8 32.0 - 36.0 g/dL   RDW 16.5 (H) 11.5 - 14.5 %   Platelets 178 150 - 440 K/uL   Neutrophils Relative % 81 %   Neutro Abs 6.1  1.4 - 6.5 K/uL   Lymphocytes Relative 11 %   Lymphs Abs 0.8 (L) 1.0 - 3.6 K/uL   Monocytes Relative 7 %   Monocytes Absolute 0.5 0.2 - 1.0 K/uL   Eosinophils Relative 0 %   Eosinophils Absolute 0.0 0 - 0.7 K/uL  Basophils Relative 1 %   Basophils Absolute 0.1 0 - 0.1 K/uL  Comprehensive metabolic panel     Status: Abnormal   Collection Time: 11/22/15  4:38 PM  Result Value Ref Range   Sodium 133 (L) 135 - 145 mmol/L   Potassium 4.7 3.5 - 5.1 mmol/L   Chloride 104 101 - 111 mmol/L   CO2 19 (L) 22 - 32 mmol/L   Glucose, Bld 104 (H) 65 - 99 mg/dL   BUN 41 (H) 6 - 20 mg/dL   Creatinine, Ser 5.88 (H) 0.61 - 1.24 mg/dL   Calcium 7.7 (L) 8.9 - 10.3 mg/dL   Total Protein 6.4 (L) 6.5 - 8.1 g/dL   Albumin 2.5 (L) 3.5 - 5.0 g/dL   AST 26 15 - 41 U/L   ALT 29 17 - 63 U/L   Alkaline Phosphatase 103 38 - 126 U/L   Total Bilirubin 0.7 0.3 - 1.2 mg/dL   GFR calc non Af Amer 23 (L) >60 mL/min   GFR calc Af Amer 27 (L) >60 mL/min    Comment: (NOTE) The eGFR has been calculated using the CKD EPI equation. This calculation has not been validated in all clinical situations. eGFR's persistently <60 mL/min signify possible Chronic Kidney Disease.    Anion gap 10 5 - 15  Troponin I     Status: None   Collection Time: 11/22/15  4:38 PM  Result Value Ref Range   Troponin I <0.03 <0.031 ng/mL    Comment:        NO INDICATION OF MYOCARDIAL INJURY.   Magnesium     Status: None   Collection Time: 11/22/15  4:38 PM  Result Value Ref Range   Magnesium 2.0 1.7 - 2.4 mg/dL  Glucose, capillary     Status: None   Collection Time: 11/22/15 10:42 PM  Result Value Ref Range   Glucose-Capillary 84 65 - 99 mg/dL   Comment 1 Notify RN   Basic metabolic panel     Status: Abnormal   Collection Time: 11/23/15  5:29 AM  Result Value Ref Range   Sodium 133 (L) 135 - 145 mmol/L   Potassium 4.6 3.5 - 5.1 mmol/L   Chloride 108 101 - 111 mmol/L   CO2 17 (L) 22 - 32 mmol/L   Glucose, Bld 111 (H) 65 - 99  mg/dL   BUN 39 (H) 6 - 20 mg/dL   Creatinine, Ser 6.91 (H) 0.61 - 1.24 mg/dL   Calcium 7.1 (L) 8.9 - 10.3 mg/dL   GFR calc non Af Amer 27 (L) >60 mL/min   GFR calc Af Amer 31 (L) >60 mL/min    Comment: (NOTE) The eGFR has been calculated using the CKD EPI equation. This calculation has not been validated in all clinical situations. eGFR's persistently <60 mL/min signify possible Chronic Kidney Disease.    Anion gap 8 5 - 15  CBC     Status: Abnormal   Collection Time: 11/23/15  5:29 AM  Result Value Ref Range   WBC 6.4 3.8 - 10.6 K/uL   RBC 4.25 (L) 4.40 - 5.90 MIL/uL   Hemoglobin 11.4 (L) 13.0 - 18.0 g/dL   HCT 91.2 (L) 44.3 - 90.9 %   MCV 82.0 80.0 - 100.0 fL   MCH 26.9 26.0 - 34.0 pg   MCHC 32.8 32.0 - 36.0 g/dL   RDW 83.1 (H) 80.1 - 52.7 %   Platelets 168 150 - 440 K/uL  Protime-INR     Status:  Abnormal   Collection Time: 11/23/15  5:29 AM  Result Value Ref Range   Prothrombin Time 47.5 (H) 11.4 - 15.0 seconds   INR 5.38 (HH)     Comment: CRITICAL RESULT CALLED TO, READ BACK BY AND VERIFIED WITH: CAROLYN SONBAY AT 6381 ON 11/23/15.Marland KitchenMarland KitchenRehabilitation Hospital Of The Northwest   APTT     Status: Abnormal   Collection Time: 11/23/15  5:29 AM  Result Value Ref Range   aPTT 72 (H) 24 - 36 seconds    Comment:        IF BASELINE aPTT IS ELEVATED, SUGGEST PATIENT RISK ASSESSMENT BE USED TO DETERMINE APPROPRIATE ANTICOAGULANT THERAPY.    Dg Chest Portable 1 View  11/22/2015  CLINICAL DATA:  Weakness, low blood pressure, dehydration, lung cancer, hypertension, coronary artery disease EXAM: PORTABLE CHEST 1 VIEW COMPARISON:  Portable exam 1632 hours compared to 07/29/2015 FINDINGS: RIGHT jugular Port-A-Cath with tip projecting over SVC. Enlargement of cardiac silhouette post CABG with pulmonary vascular congestion. New moderate LEFT pleural effusion and basilar atelectasis. Minimal RIGHT basilar atelectasis Question perihilar edema. No pneumothorax or acute osseous findings. IMPRESSION: Enlargement of cardiac silhouette  post CABG with pulmonary vascular congestion and question mild pulmonary edema. Moderate LEFT pleural effusion and and basilar atelectasis new since prior exam. Electronically Signed   By: Lavonia Dana M.D.   On: 11/22/2015 16:45    Assessment:  The patient is a 81 y.o. gentleman with stage IV lung cancer admitted with dehydration and renal failure.  He was diagnosed on 09/25/2015.  He has received 1 cycle of carboplatin and Taxol (11/02/2015) and is currently day 9 status post cycle #1 Keytruda (11/15/2015).  Echocardiogram on 07/30/2015 revealed an EF of 60-65%.  Symptomatically, he notes poor appetite and subsequent oral intake. He has lost 39 pounds since initial presentation with his lung cancer.   Labs on admission were notable for acute renal failure (BUN 41/creainine 2.5) with prior BUN/Cr 25/1.19 on 11/15/2015.  Urinalysis is benign with no evidence of nephritis.  Creatinine is slightly better today with hydration.  Plan:   1.  Continue gentle hydration. 2.  No evidence of Keytruda side effects (urine sediment benign). 3.  Monitor left sided pleural effusion.  No current indication for thoracentesis. 4.  Nutrition consult.  Thank you for allowing me to participate in JONANTHAN BOLENDER 's care.  I will follow him closely with you while hospitalized.   Lequita Asal, MD  11/23/2015, 10:21 PM

## 2015-11-24 LAB — OSMOLALITY: OSMOLALITY: 293 mosm/kg (ref 275–295)

## 2015-11-24 LAB — OSMOLALITY, URINE: Osmolality, Ur: 608 mOsm/kg (ref 300–900)

## 2015-11-24 MED ORDER — VITAMIN K1 10 MG/ML IJ SOLN
1.0000 mg | Freq: Once | INTRAMUSCULAR | Status: AC
  Administered 2015-11-24: 1 mg via INTRAVENOUS
  Filled 2015-11-24: qty 0.1

## 2015-11-24 MED ORDER — SODIUM CHLORIDE 0.9 % IV SOLN
INTRAVENOUS | Status: DC
  Administered 2015-11-24: 14:00:00 via INTRAVENOUS

## 2015-11-24 NOTE — Evaluation (Signed)
Clinical/Bedside Swallow Evaluation Patient Details  Name: [name] MRN: [id number] Date of Birth: [birth year]  Today's Date: 11/24/2015 Time: SLP Start Time (ACUTE ONLY): 42 SLP Stop Time (ACUTE ONLY): 1150 SLP Time Calculation (min) (ACUTE ONLY): 60 min  Past Medical History:  Past Medical History  Diagnosis Date  . Hypertension   . Coronary artery disease   . Atrial fibrillation ([place]) 07/2015  . Primary cancer of left upper lobe of lung ([place]) 10/06/2015   Past Surgical History:  Past Surgical History  Procedure Laterality Date  . Coronary artery bypass graft  12/10/2005    LIMA to LAD,SVG to diagonal,SVG to ramus intermediate,SVG to distal RCA.  . Tonsillectomy    . Tee without cardioversion  05/10/2012    Procedure: TRANSESOPHAGEAL ECHOCARDIOGRAM (TEE);  Surgeon: [name], MD;  Location: [facility] ENDOSCOPY;  Service: Cardiovascular;  Laterality: N/A;  . Cardioversion  05/10/2012    Procedure: CARDIOVERSION;  Surgeon: [name], MD;  Location: [facility] ENDOSCOPY;  Service: Cardiovascular;  Laterality: N/A;  . Cardiac catheterization  10/28/2005    significant 3 vessel CAD  . Nm myocar perf wall motion  09/16/2005    evidence of diaphragmatic attenuation & subtle anterolateral ischemia.  . Cardioversion N/A 07/31/2015    Procedure: CARDIOVERSION;  Surgeon: Josue Hector, MD;  Location: [facility] ENDOSCOPY;  Service: Cardiovascular;  Laterality: N/A;  . Peripheral vascular catheterization N/A 10/18/2015    Procedure: Glori Luis Cath Insertion;  Surgeon: Algernon Huxley, MD;  Location: [place] CV LAB;  Service: Cardiovascular;  Laterality: N/A;   HPI:  [name] is a 81 y.o. male with a known history of hypertension, coronary artery disease, atrial fibrillation on Coumadin, stage IV lung cancer with metastasis. The patient got chemotherapy at the end of last month.he has had poor oral intake, dizziness and generalized weakness for the past the 2 weeks. He went to Dr. Metro Kung  office and was found to have low blood pressure at 80s. He was sent to the ED for further evaluation. HE ALSO COMPLAINS OF SHORTNESS OF BREATH. BUT HE DENIES ANY FEVER OR CHILLS. his blood pressure in the ED was 66/45 and he was found to have acute renal failure with creatinine increased to more than 2.5. He was treated with normal saline bolus. The patient presented with initial discomfort in the chest. He then developed a dry hacking cough and hoarseness. He was seen by Dr. Pryor Ochoa of ENT and discovered to have left vocal cord paralysis. Chest CT in 08/2015 revealed a 3.2 x 2.5 cm left upper lobe lung mass. There was ipsilateral peribronchial and ipsilateral hilar lymphadenopathy.PET scan revealed multiple bone metastases. Biopsy of an abdominal wall nodule on 09/25/2015 revealed poorly differentiated carcinoma(CK 7 positive, TTF negative) clinically consistent with lung primary.   Assessment / Plan / Recommendation Clinical Impression  Pt presents w/no apparent oropharyngeal dysphagia w/tested consistencies, however moderate-severe aspriation risk d/t left vocal cord paralysis causing decreased airway protection. Pt given thin and puree consistencies and demonstrated no overt s/s of aspiration. Laryngeal elevation appeared adequate and vocal quality (although breathy) did not become wet. No increased WOB or SOB noted. Pt remains a high risk of aspiration d/t vocal cord paralyis and higher possibility of silent aspiration. Pt also observed to tilt head to right during swallow (reportedly because then boluses do not become stuck). Did not attempt solids, d/t pt's c/o increased texture, including pills, becoming stuck in throat. Oral phase appeared [facility] for thin and puree and oral mech  exam appeared [facility]. Pt has been eating very poorly at home and with discussion with doctor would recommend Dysphagia I diet with thin liquids and strict aspiration precautions.  Will plan on MBSS on Monday to determine  least restrictive safe diet. All in agreement.     Aspiration Risk  Moderate aspiration risk    Diet Recommendation Dysphagia 1 (Puree);Thin liquid   Liquid Administration via: Cup;Straw Medication Administration: Crushed with puree Supervision: Patient able to self feed Compensations: Minimize environmental distractions;Slow rate;Small sips/bites;Follow solids with liquid Postural Changes: Seated upright at 90 degrees    Other  Recommendations Oral Care Recommendations: Oral care BID;Patient independent with oral care   Follow up Recommendations  Skilled Nursing facility    Frequency and Duration            Prognosis Prognosis for Safe Diet Advancement: Guarded Barriers to Reach Goals: Other (Comment) (left vocal cord paralysis)      Swallow Study   General Date of Onset: 11/24/15 HPI: [name] is a 81 y.o. male with a known history of hypertension, coronary artery disease, atrial fibrillation on Coumadin, stage IV lung cancer with metastasis. The patient got chemotherapy at the end of last month.he has had poor oral intake, dizziness and generalized weakness for the past the 2 weeks. He went to Dr. Metro Kung office and was found to have low blood pressure at 80s. He was sent to the ED for further evaluation. HE ALSO COMPLAINS OF SHORTNESS OF BREATH. BUT HE DENIES ANY FEVER OR CHILLS. his blood pressure in the ED was 66/45 and he was found to have acute renal failure with creatinine increased to more than 2.5. He was treated with normal saline bolus. The patient presented with initial discomfort in the chest. He then developed a dry hacking cough and hoarseness. He was seen by Dr. Pryor Ochoa of ENT and discovered to have left vocal cord paralysis. Chest CT in 08/2015 revealed a 3.2 x 2.5 cm left upper lobe lung mass. There was ipsilateral peribronchial and ipsilateral hilar lymphadenopathy.PET scan revealed multiple bone metastases. Biopsy of an abdominal wall nodule on  09/25/2015 revealed poorly differentiated carcinoma(CK 7 positive, TTF negative) clinically consistent with lung primary. Type of Study: Bedside Swallow Evaluation Previous Swallow Assessment: None reported Diet Prior to this Study: Other (Comment) (Pt reported he was eating soft foods at home with thin liquids) Temperature Spikes Noted: No Respiratory Status: Room air History of Recent Intubation: No Behavior/Cognition: Alert;Cooperative Oral Cavity Assessment: Within Functional Limits Oral Care Completed by SLP: No Oral Cavity - Dentition: Dentures, top;Dentures, bottom Vision: Functional for self-feeding Self-Feeding Abilities: Able to feed self Patient Positioning: Upright in bed Baseline Vocal Quality: Breathy;Aphonic;Other (comment) (Left vocal cord paralysis per chart review) Volitional Cough: Weak Volitional Swallow: Able to elicit    Oral/Motor/Sensory Function Overall Oral Motor/Sensory Function: Within functional limits   Ice Chips Ice chips: Not tested   Thin Liquid Thin Liquid: Within functional limits Presentation: Cup;Straw    Nectar Thick Nectar Thick Liquid: Not tested   Honey Thick Honey Thick Liquid: Not tested   Puree Puree: Within functional limits Presentation: Self Fed;Spoon   Solid   GO   Solid: Not tested Other Comments: DNT d/t pt's reports with increased difficulty with solids        Conyngham,Maaran 11/24/2015,11:51 AM

## 2015-11-24 NOTE — Evaluation (Signed)
Physical Therapy Evaluation Patient Details Name: DANARIUS MCCONATHY MRN: [id number] DOB: Aug 22, 1935 Today's Date: 11/24/2015   History of Present Illness  Pt here with chest pain, cough and has been dealing with treatments for stage for lung cancer. He has some vocal cord paralysis and has trouble fully articulating.  Clinical Impression  Pt is able to ambulate around the nurses' station w/o issue needing no assist or AD.  He reports some minimal fatigue but his vitals remain stable with the prolonged walk.  Pt displays functional strength in U&LEs and ultimately does not need further PT intervention here or at home.      Follow Up Recommendations No PT follow up    Equipment Recommendations       Recommendations for Other Services       Precautions / Restrictions Restrictions Weight Bearing Restrictions: No      Mobility  Bed Mobility Overal bed mobility: Independent                Transfers Overall transfer level: Independent Equipment used: None                Ambulation/Gait Ambulation/Gait assistance: Modified independent (Device/Increase time) Ambulation Distance (Feet): 200 Feet Assistive device: None       General Gait Details: Pt walks with consistent speed and good safety.  No LOBs, O2 on room air was min/low 90s before getting up and was mid/low 90s after the effort.    Stairs            Wheelchair Mobility    Modified Rankin (Stroke Patients Only)       Balance                                             Pertinent Vitals/Pain Pain Assessment: No/denies pain    Home Living Family/patient expects to be discharged to:: Private residence Living Arrangements: Spouse/significant other     Home Access: Level entry              Prior Function Level of Independence: Independent         Comments: Pt reports he has not been out of the house much since starting cancer treatements.      Hand Dominance       Extremity/Trunk Assessment   Upper Extremity Assessment: Overall WFL for tasks assessed           Lower Extremity Assessment: Overall WFL for tasks assessed         Communication   Communication:  (difficulty speaking secondary to vocal cord paralysis)  Cognition Arousal/Alertness: Awake/alert Behavior During Therapy: WFL for tasks assessed/performed Overall Cognitive Status: Within Functional Limits for tasks assessed                      General Comments      Exercises        Assessment/Plan    PT Assessment Patent does not need any further PT services  PT Diagnosis Generalized weakness   PT Problem List    PT Treatment Interventions     PT Goals (Current goals can be found in the Care Plan section) Acute Rehab PT Goals Patient Stated Goal: None stated    Frequency     Barriers to discharge        Co-evaluation  End of Session Equipment Utilized During Treatment: Gait belt Activity Tolerance: Patient tolerated treatment well Patient left: with bed alarm set;with call bell/phone within reach           Time: 0812-0825 PT Time Calculation (min) (ACUTE ONLY): 13 min   Charges:   PT Evaluation $PT Eval Low Complexity: 1 Procedure     PT G Codes:       Wayne Both, PT, DPT [phone]  Kreg Shropshire 11/24/2015, 11:08 AM

## 2015-11-24 NOTE — Progress Notes (Signed)
[place] [facility] [place] NAME: [name]    MR#:  [id number]  DATE OF BIRTH:  03-13-1935  SUBJECTIVE:  CHIEF COMPLAINT:   Chief Complaint  Patient presents with  . Hypotension  . Dizziness   patient is 81 year old male with past medical history significant for history of essential hypertension, coronary artery disease, atrial fibrillation, on Coumadin therapy, stage IV lung cancer with metastasis who presents to the hospital with very poor oral intake, dizziness and generalized weakness. He was seen by Dr. Ramond Craver. He and was found to have hypotension with systolic blood pressure in 80s. He was sent to emergency room and was admitted. Labs revealed acute renal failure, creatinine of 2.5, baseline being at 1.2-1.6. Patient was given IV fluids and his creatinine improved. He denies any problems with urination. Urinalysis was unremarkable. Chest x-ray was concerning for pulmonary vascular congestion, pulmonary edema, left basilar atelectasis, left pleural effusion. She has significant discharge and modified barium swallowing study was recommended by speech therapist who saw patient in consultation today  Review of Systems  Constitutional: Negative for fever, chills and weight loss.  HENT: Negative for congestion.   Eyes: Negative for blurred vision and double vision.  Respiratory: Positive for cough. Negative for sputum production, shortness of breath and wheezing.   Cardiovascular: Negative for chest pain, palpitations, orthopnea, leg swelling and PND.  Gastrointestinal: Positive for constipation. Negative for nausea, vomiting, abdominal pain, diarrhea and blood in stool.  Genitourinary: Negative for dysuria, urgency, frequency and hematuria.  Musculoskeletal: Negative for falls.  Neurological: Negative for dizziness, tremors, focal weakness and headaches.  Endo/Heme/Allergies: Does not bruise/bleed easily.  Psychiatric/Behavioral: Negative  for depression. The patient does not have insomnia.     VITAL SIGNS: Blood pressure 118/76, pulse 107, temperature 98 F (36.7 C), temperature source Oral, resp. rate 19, height '5\' 6"'$  (1.676 m), weight 82.918 kg (182 lb 12.8 oz), SpO2 94 %.  PHYSICAL EXAMINATION:   GENERAL:  81 y.o.-year-old patient lying in the bed with no acute distress. Significant respiratory distress with speech, gasping for air dysarthric,  EYES: Pupils equal, round, reactive to light and accommodation. No scleral icterus. Extraocular muscles intact.  HEENT: Head atraumatic, normocephalic. Oropharynx and nasopharynx clear.  NECK:  Supple, no jugular venous distention. No thyroid enlargement, no tenderness.  LUNGS: Normal breath sounds bilaterally, few wheezing, basilar rales,rhonchi or crepitation noted intermittently, scattered. Using accessory muscles of respiration, especially with speech  CARDIOVASCULAR: S1, S2 normal. No murmurs, rubs, or gallops.  ABDOMEN: Soft, nontender, nondistended. Bowel sounds present. No organomegaly or mass.  EXTREMITIES: No pedal edema, cyanosis, or clubbing.  NEUROLOGIC: Cranial nerves II through XII are intact. Muscle strength 5/5 in all extremities. Sensation intact. Gait not checked.  PSYCHIATRIC: The patient is alert and oriented x 3.  SKIN: No obvious rash, lesion, or ulcer.   ORDERS/RESULTS REVIEWED:   CBC  Recent Labs Lab 11/22/15 1410 11/22/15 1638 11/23/15 0529  WBC 9.1 7.4 6.4  HGB 12.7* 11.3* 11.4*  HCT 38.3* 34.6* 34.8*  PLT 196 178 168  MCV 80.6 79.7* 82.0  MCH 26.7 26.2 26.9  MCHC 33.1 32.8 32.8  RDW 17.1* 16.5* 17.1*  LYMPHSABS 0.9* 0.8*  --   MONOABS 0.5 0.5  --   EOSABS 0.0 0.0  --   BASOSABS 0.1 0.1  --    ------------------------------------------------------------------------------------------------------------------  Chemistries   Recent Labs Lab 11/22/15 1638 11/23/15 0529  NA 133* 133*  K 4.7 4.6  CL 104  108  CO2 19* 17*  GLUCOSE 104*  111*  BUN 41* 39*  CREATININE 2.50* 2.21*  CALCIUM 7.7* 7.1*  MG 2.0  --   AST 26  --   ALT 29  --   ALKPHOS 103  --   BILITOT 0.7  --    ------------------------------------------------------------------------------------------------------------------ estimated creatinine clearance is 27.4 mL/min (by C-G formula based on Cr of 2.21). ------------------------------------------------------------------------------------------------------------------ No results for input(s): TSH, T4TOTAL, T3FREE, THYROIDAB in the last 72 hours.  Invalid input(s): FREET3  Cardiac Enzymes  Recent Labs Lab 11/22/15 1638  TROPONINI <0.03   ------------------------------------------------------------------------------------------------------------------ Invalid input(s): POCBNP ---------------------------------------------------------------------------------------------------------------  RADIOLOGY: Dg Chest Portable 1 View  11/22/2015  CLINICAL DATA:  Weakness, low blood pressure, dehydration, lung cancer, hypertension, coronary artery disease EXAM: PORTABLE CHEST 1 VIEW COMPARISON:  Portable exam 1632 hours compared to 07/29/2015 FINDINGS: RIGHT jugular Port-A-Cath with tip projecting over SVC. Enlargement of cardiac silhouette post CABG with pulmonary vascular congestion. New moderate LEFT pleural effusion and basilar atelectasis. Minimal RIGHT basilar atelectasis Question perihilar edema. No pneumothorax or acute osseous findings. IMPRESSION: Enlargement of cardiac silhouette post CABG with pulmonary vascular congestion and question mild pulmonary edema. Moderate LEFT pleural effusion and and basilar atelectasis new since prior exam. Electronically Signed   By: Lavonia Dana M.D.   On: 11/22/2015 16:45    EKG:  Orders placed or performed during the hospital encounter of 11/22/15  . ED EKG  . ED EKG    ASSESSMENT AND PLAN:  Active Problems:   ARF (acute renal failure) (HCC)   Hypotension    Supratherapeutic INR #1 Acute on chronic renal failure due to dehydration,  poor by mouth intake, continue IV fluids. Following creatinine closely. Improving #2 failure to thrive, adult , unclear etiology , patient reports some element of constipation, add Senokot , following oral intake closely , getting dietary involved for further recommendations   #3. Dysphagia, continue patient on dysphagia diet with thin liquids, modified barium swallowing is to be done on Monday, appreciate speech therapist involvement #4. Hyponatremia, urine and blood osmolarities revealed inappropriate antidiuretic hormone release, change IV fluids to normal saline and follow sodium level in the morning. Discontinue IV fluids completely if sodium level does not improve or worsen  #5. Coagulopathy, acquired, discontinued Coumadin, give vitamin K, follow pro time in the morning   Management plans discussed with the patient, family and they are in agreement.   DRUG ALLERGIES:  Allergies  Allergen Reactions  . Clonidine Derivatives Other (See Comments)    Pt was not aware of a reaction to this medication  . Diltiazem Hcl Er Other (See Comments)    High blood pressure  . Statins Other (See Comments)    Myalgias:  Previously on Crestor,Vytorin,Simvastatin,Lipitor & Pravastatin  . Sulfa Antibiotics Swelling    CODE STATUS:     Code Status Orders        Start     Ordered   11/22/15 1946  Do not attempt resuscitation (DNR)   Continuous    Question Answer Comment  In the event of cardiac or respiratory ARREST Do not call a "code blue"   In the event of cardiac or respiratory ARREST Do not perform Intubation, CPR, defibrillation or ACLS   In the event of cardiac or respiratory ARREST Use medication by any route, position, wound care, and other measures to relive pain and suffering. May use oxygen, suction and manual treatment of airway obstruction as needed for comfort.      11/22/15  1945    Code Status History     Date Active Date Inactive Code Status Order ID Comments User Context   10/18/2015 11:01 AM 10/18/2015  5:37 PM Full Code [id number]  Algernon Huxley, MD Inpatient   10/16/2015 12:35 PM 10/18/2015 11:01 AM Full Code [id number]  Roxana Hires, MD Inpatient   07/29/2015  6:18 AM 07/31/2015  6:44 PM Full Code [id number]  Nelva Bush, MD Inpatient   12/26/2014 10:34 PM 12/29/2014  6:32 PM Full Code [id number]  Phillips Grout, MD Inpatient   05/06/2012 12:12 AM 05/11/2012  8:32 PM Full Code [id number]  Reeves Dam, RN Inpatient      TOTAL TIME TAKING CARE OF THIS PATIENT: 40 minutes.   discussed with  speech therapist   Anorah Trias M.D on 11/24/2015 at 12:42 PM  Between 7am to 6pm - Pager - [phone]  After 6pm go to [email or web address] - password [email or web address] [facility]  Office  [phone]  CC: Primary care physician; Pcp Not In System

## 2015-11-25 ENCOUNTER — Inpatient Hospital Stay: Payer: Medicare Other

## 2015-11-25 DIAGNOSIS — R600 Localized edema: Secondary | ICD-10-CM

## 2015-11-25 DIAGNOSIS — R1310 Dysphagia, unspecified: Secondary | ICD-10-CM

## 2015-11-25 LAB — COMPREHENSIVE METABOLIC PANEL
ALBUMIN: 2.2 g/dL — AB (ref 3.5–5.0)
ALK PHOS: 88 U/L (ref 38–126)
ALT: 21 U/L (ref 17–63)
ANION GAP: 9 (ref 5–15)
AST: 24 U/L (ref 15–41)
BILIRUBIN TOTAL: 0.8 mg/dL (ref 0.3–1.2)
BUN: 36 mg/dL — ABNORMAL HIGH (ref 6–20)
CALCIUM: 7.7 mg/dL — AB (ref 8.9–10.3)
CO2: 19 mmol/L — ABNORMAL LOW (ref 22–32)
Chloride: 108 mmol/L (ref 101–111)
Creatinine, Ser: 1.91 mg/dL — ABNORMAL HIGH (ref 0.61–1.24)
GFR calc Af Amer: 37 mL/min — ABNORMAL LOW (ref >60)
GFR calc non Af Amer: 32 mL/min — ABNORMAL LOW (ref >60)
GLUCOSE: 124 mg/dL — AB (ref 65–99)
Potassium: 4.8 mmol/L (ref 3.5–5.1)
Sodium: 136 mmol/L (ref 135–145)
TOTAL PROTEIN: 6 g/dL — AB (ref 6.5–8.1)

## 2015-11-25 LAB — PROTIME-INR
INR: 1.35
Prothrombin Time: 16.8 seconds — ABNORMAL HIGH (ref 11.4–15.0)

## 2015-11-25 LAB — BASIC METABOLIC PANEL
Anion gap: 9 (ref 5–15)
Anion gap: 9 (ref 5–15)
BUN: 35 mg/dL — AB (ref 6–20)
BUN: 37 mg/dL — AB (ref 6–20)
CHLORIDE: 110 mmol/L (ref 101–111)
CHLORIDE: 110 mmol/L (ref 101–111)
CO2: 19 mmol/L — AB (ref 22–32)
CO2: 16 mmol/L — AB (ref 22–32)
CREATININE: 1.99 mg/dL — AB (ref 0.61–1.24)
CREATININE: 2.01 mg/dL — AB (ref 0.61–1.24)
Calcium: 7.9 mg/dL — ABNORMAL LOW (ref 8.9–10.3)
Calcium: 7.7 mg/dL — ABNORMAL LOW (ref 8.9–10.3)
GFR calc Af Amer: 35 mL/min — ABNORMAL LOW (ref >60)
GFR calc non Af Amer: 30 mL/min — ABNORMAL LOW (ref >60)
GFR calc non Af Amer: 30 mL/min — ABNORMAL LOW (ref >60)
GFR, EST AFRICAN AMERICAN: 35 mL/min — AB (ref >60)
GLUCOSE: 120 mg/dL — AB (ref 65–99)
Glucose, Bld: 128 mg/dL — ABNORMAL HIGH (ref 65–99)
Potassium: 6.5 mmol/L — ABNORMAL HIGH (ref 3.5–5.1)
Potassium: 5.2 mmol/L — ABNORMAL HIGH (ref 3.5–5.1)
Sodium: 138 mmol/L (ref 135–145)
Sodium: 135 mmol/L (ref 135–145)

## 2015-11-25 MED ORDER — FUROSEMIDE 10 MG/ML IJ SOLN
40.0000 mg | Freq: Once | INTRAMUSCULAR | Status: AC
  Administered 2015-11-25: 40 mg via INTRAVENOUS
  Filled 2015-11-25: qty 4

## 2015-11-25 MED ORDER — SODIUM POLYSTYRENE SULFONATE 15 GM/60ML PO SUSP
30.0000 g | Freq: Two times a day (BID) | ORAL | Status: DC
  Administered 2015-11-25 – 2015-11-26 (×2): 30 g via ORAL
  Filled 2015-11-25 (×2): qty 120

## 2015-11-25 NOTE — Progress Notes (Signed)
[place] at [place] NAME: [name]    MR#:  [id number]  DATE OF BIRTH:  April 09, 1935  SUBJECTIVE:  CHIEF COMPLAINT:   Chief Complaint  Patient presents with  . Hypotension  . Dizziness   patient is 81 year old male with past medical history significant for history of essential hypertension, coronary artery disease, atrial fibrillation, on Coumadin therapy, stage IV lung cancer with metastasis who presents to the hospital with very poor oral intake, dizziness and generalized weakness. He was seen by Dr. Oliva Bustard and was found to have hypotension with systolic blood pressure in 80s. He was sent to emergency room and was admitted. Labs revealed acute renal failure, creatinine of 2.5, baseline being at 1.2-1.6. Patient was given IV fluids and his creatinine improved. He is noted to be more short of breath and repeat the chest x-ray revealed worsening left pleural effusion . Patient's IV fluids were stopped and Lasix and IV was administered with some improvement of his respiratory status. Remains on oxygen therapy 2 L of oxygen and his oxygenation is stable Review of Systems  Constitutional: Negative for fever, chills and weight loss.  HENT: Negative for congestion.   Eyes: Negative for blurred vision and double vision.  Respiratory: Positive for cough. Negative for sputum production, shortness of breath and wheezing.   Cardiovascular: Negative for chest pain, palpitations, orthopnea, leg swelling and PND.  Gastrointestinal: Positive for constipation. Negative for nausea, vomiting, abdominal pain, diarrhea and blood in stool.  Genitourinary: Negative for dysuria, urgency, frequency and hematuria.  Musculoskeletal: Negative for falls.  Neurological: Negative for dizziness, tremors, focal weakness and headaches.  Endo/Heme/Allergies: Does not bruise/bleed easily.  Psychiatric/Behavioral: Negative for depression. The patient does not have  insomnia.     VITAL SIGNS: Blood pressure 114/68, pulse 103, temperature 97.8 F (36.6 C), temperature source Oral, resp. rate 20, height '5\' 6"'$  (1.676 m), weight 82.918 kg (182 lb 12.8 oz), SpO2 98 %.  PHYSICAL EXAMINATION:   GENERAL:  81 y.o.-year-old patient lying in the bed in moderate respiratory distress. Dysarthric, gasping for air EYES: Pupils equal, round, reactive to light and accommodation. No scleral icterus. Extraocular muscles intact.  HEENT: Head atraumatic, normocephalic. Oropharynx and nasopharynx clear.  NECK:  Supple, no jugular venous distention. No thyroid enlargement, no tenderness.  LUNGS: Markedly diminished breath sounds bilaterally, more on the left side, some dullness to percussion on the left,  scattered wheezing, basilar rales,rhonchi or crepitation noted intermittently, scattered. Using accessory muscles of respiration, especially with speech  CARDIOVASCULAR: S1, S2 normal. No murmurs, rubs, or gallops.  ABDOMEN: Soft, nontender, nondistended. Bowel sounds present. No organomegaly or mass.  EXTREMITIES: No pedal edema, cyanosis, or clubbing.  NEUROLOGIC: Cranial nerves II through XII are intact. Muscle strength 5/5 in all extremities. Sensation intact. Gait not checked.  PSYCHIATRIC: The patient is alert and oriented x 3.  SKIN: No obvious rash, lesion, or ulcer.   ORDERS/RESULTS REVIEWED:   CBC  Recent Labs Lab 11/22/15 1410 11/22/15 1638 11/23/15 0529  WBC 9.1 7.4 6.4  HGB 12.7* 11.3* 11.4*  HCT 38.3* 34.6* 34.8*  PLT 196 178 168  MCV 80.6 79.7* 82.0  MCH 26.7 26.2 26.9  MCHC 33.1 32.8 32.8  RDW 17.1* 16.5* 17.1*  LYMPHSABS 0.9* 0.8*  --   MONOABS 0.5 0.5  --   EOSABS 0.0 0.0  --   BASOSABS 0.1 0.1  --    ------------------------------------------------------------------------------------------------------------------  Chemistries   Recent Labs Lab 11/22/15 1638 11/23/15  3212 11/25/15 0712 11/25/15 1225  NA 133* 133* 138 135  K 4.7  4.6 6.5* 5.2*  CL 104 108 110 110  CO2 19* 17* 19* 16*  GLUCOSE 104* 111* 120* 128*  BUN 41* 39* 35* 37*  CREATININE 2.50* 2.21* 1.99* 2.01*  CALCIUM 7.7* 7.1* 7.9* 7.7*  MG 2.0  --   --   --   AST 26  --   --   --   ALT 29  --   --   --   ALKPHOS 103  --   --   --   BILITOT 0.7  --   --   --    ------------------------------------------------------------------------------------------------------------------ estimated creatinine clearance is 30.1 mL/min (by C-G formula based on Cr of 2.01). ------------------------------------------------------------------------------------------------------------------ No results for input(s): TSH, T4TOTAL, T3FREE, THYROIDAB in the last 72 hours.  Invalid input(s): FREET3  Cardiac Enzymes  Recent Labs Lab 11/22/15 1638  TROPONINI <0.03   ------------------------------------------------------------------------------------------------------------------ Invalid input(s): POCBNP ---------------------------------------------------------------------------------------------------------------  RADIOLOGY: Dg Chest 2 View  11/25/2015  CLINICAL DATA:  81 year old male with metastatic left lung cancer. Weakness, dizziness and extreme shortness of breath. Initial encounter. EXAM: CHEST  2 VIEW COMPARISON:  11/22/2015 and earlier. FINDINGS: Seated AP and lateral views of the chest. Moderate to large left pleural effusion is stable since 11/22/2015. Stable cardiac size and mediastinal contours. Right chest porta cath remains in place. Stable pulmonary vascularity without overt edema. No pneumothorax. No right pleural effusion or new pulmonary opacity. Sequelae of CABG. No acute osseous abnormality identified. IMPRESSION: Stable with moderate to large left pleural effusion since 11/22/2015. No new cardiopulmonary abnormality. Electronically Signed   By: Genevie Ann M.D.   On: 11/25/2015 13:51    EKG:  Orders placed or performed during the hospital encounter of  11/22/15  . ED EKG  . ED EKG    ASSESSMENT AND PLAN:  Active Problems:   ARF (acute renal failure) (HCC)   Hypotension   Supratherapeutic INR #1 Acute on chronic renal failure, which was felt to be due to dehydration,  poor by mouth intake, now off IV fluids due to worsening left pleural effusion. Following creatinine closely. Improving #2 failure to thrive, adult , unclear etiology , patient reported some element of constipation, added Senokot , had bowel movement today, oral intake, however, remains very poor , getting dietary involved for further recommendations   #3. Dysphagia, continue patient on dysphagia diet with thin liquids, modified barium swallowing is to be done on Monday, appreciate speech therapist involvement #4. Hyponatremia, urine and blood osmolarities revealed inappropriate antidiuretic hormone release, SIADH, resolved With IV fluids, now patient is off IV fluids and follow sodium level in the morning  #5. Coagulopathy, acquired, now off Coumadin, INR is 1.35 today after vitamin K administration, follow pro time in the morning #6. Acute diastolic CHF, patient had echocardiogram done in October 2016, we'll continue intermittent Lasix if needed #7. Left pleural effusion, get thoracentesis tomorrow morning #8. Hyperkalemia, patient was given Lasix was no significant improvement in the potassium levels, initiating Kayexalate orally, follow potassium as well as kidney function test in the morning.  #9. Acute respiratory failure due to pleural effusion, acute diastolic CHF, continue intermittent Lasix and oxygen therapy, wean as tolerated  Management plans discussed with the patient, family and they are in agreement.   DRUG ALLERGIES:  Allergies  Allergen Reactions  . Clonidine Derivatives Other (See Comments)    Pt was not aware of a reaction to this medication  . Diltiazem  Hcl Er Other (See Comments)    High blood pressure  . Statins Other (See Comments)    Myalgias:   Previously on Crestor,Vytorin,Simvastatin,Lipitor & Pravastatin  . Sulfa Antibiotics Swelling    CODE STATUS:     Code Status Orders        Start     Ordered   11/22/15 1946  Do not attempt resuscitation (DNR)   Continuous    Question Answer Comment  In the event of cardiac or respiratory ARREST Do not call a "code blue"   In the event of cardiac or respiratory ARREST Do not perform Intubation, CPR, defibrillation or ACLS   In the event of cardiac or respiratory ARREST Use medication by any route, position, wound care, and other measures to relive pain and suffering. May use oxygen, suction and manual treatment of airway obstruction as needed for comfort.      11/22/15 1945    Code Status History    Date Active Date Inactive Code Status Order ID Comments User Context   10/18/2015 11:01 AM 10/18/2015  5:37 PM Full Code [id number]  Algernon Huxley, MD Inpatient   10/16/2015 12:35 PM 10/18/2015 11:01 AM Full Code [id number]  Roxana Hires, MD Inpatient   07/29/2015  6:18 AM 07/31/2015  6:44 PM Full Code [id number]  Nelva Bush, MD Inpatient   12/26/2014 10:34 PM 12/29/2014  6:32 PM Full Code [id number]  Phillips Grout, MD Inpatient   05/06/2012 12:12 AM 05/11/2012  8:32 PM Full Code [id number]  Reeves Dam, RN Inpatient      TOTAL FECAL CARE TIME TAKING CARE OF THIS PATIENT: 45 minutes.     Theodoro Grist M.D on 11/25/2015 at 2:58 PM  Between 7am to 6pm - Pager - [phone]  After 6pm go to [email or web address] - password [email or web address] [facility]  Office  [phone]  CC: Primary care physician; Pcp Not In System

## 2015-11-25 NOTE — Progress Notes (Signed)
Patient's nurse called for prn treatment for the patient.  After getting into room and preparing to give treatment patient refused. Stated it made him cough to much and would keep him up tonight and wouldn't be able to sleep.

## 2015-11-25 NOTE — Progress Notes (Signed)
[facility]  Date of admission:  11/22/2015  Inpatient day:  11/25/2015  Consulting physician:  Valentino Nose, MD   Reason for Consultation:  Lung cancer  Chief Complaint: [name] is a 81 y.o. male with stage IV lung cancer who was admitted with hypotension and acute renal failure.  Subjective:  Increased shortness of breath today.  Increasing leg edema.  No chest pain.  Dysphagia.  Past Medical History  Diagnosis Date  . Hypertension   . Coronary artery disease   . Atrial fibrillation ([place]) 07/2015  . Primary cancer of left upper lobe of lung ([place]) 10/06/2015    Past Surgical History  Procedure Laterality Date  . Coronary artery bypass graft  12/10/2005    LIMA to LAD,SVG to diagonal,SVG to ramus intermediate,SVG to distal RCA.  . Tonsillectomy    . Tee without cardioversion  05/10/2012    Procedure: TRANSESOPHAGEAL ECHOCARDIOGRAM (TEE);  Surgeon: [name], MD;  Location: [facility] ENDOSCOPY;  Service: Cardiovascular;  Laterality: N/A;  . Cardioversion  05/10/2012    Procedure: CARDIOVERSION;  Surgeon: [name], MD;  Location: [facility] ENDOSCOPY;  Service: Cardiovascular;  Laterality: N/A;  . Cardiac catheterization  10/28/2005    significant 3 vessel CAD  . Nm myocar perf wall motion  09/16/2005    evidence of diaphragmatic attenuation & subtle anterolateral ischemia.  . Cardioversion N/A 07/31/2015    Procedure: CARDIOVERSION;  Surgeon: Josue Hector, MD;  Location: [facility] ENDOSCOPY;  Service: Cardiovascular;  Laterality: N/A;  . Peripheral vascular catheterization N/A 10/18/2015    Procedure: Glori Luis Cath Insertion;  Surgeon: Algernon Huxley, MD;  Location: [place] CV LAB;  Service: Cardiovascular;  Laterality: N/A;    Family History  Problem Relation Age of Onset  . Stroke Mother 9  . Stroke Father 35  . Heart attack Father 4  . Heart attack Brother 74  . Cancer Sister 4    Social History:  reports that he quit smoking about 10 years  ago. His smoking use included Cigarettes. He has a 40 pack-year smoking history. He has never used smokeless tobacco. He reports that he does not drink alcohol or use illicit drugs.  The patient is accompanied by his wife and brother today.  Allergies:  Allergies  Allergen Reactions  . Clonidine Derivatives Other (See Comments)    Pt was not aware of a reaction to this medication  . Diltiazem Hcl Er Other (See Comments)    High blood pressure  . Statins Other (See Comments)    Myalgias:  Previously on Crestor,Vytorin,Simvastatin,Lipitor & Pravastatin  . Sulfa Antibiotics Swelling    Medications Prior to Admission  Medication Sig Dispense Refill  . aspirin EC 81 MG tablet Take 81 mg by mouth daily.    Marland Kitchen guaiFENesin (MUCINEX) 600 MG 12 hr tablet Take 1 tablet (600 mg total) by mouth 2 (two) times daily. 60 tablet 0  . HYDROcodone-acetaminophen (NORCO/VICODIN) 5-325 MG tablet Take 1 tablet by mouth every 6 (six) hours as needed for moderate pain. 60 tablet 0  . ipratropium-albuterol (DUONEB) 0.5-2.5 (3) MG/3ML SOLN Take 3 mLs by nebulization every 4 (four) hours as needed. (Patient taking differently: Take 3 mLs by nebulization every 4 (four) hours as needed (for shortness of breath). ) 360 mL 3  . lidocaine-prilocaine (EMLA) cream Apply 1 application topically as needed. (Patient taking differently: Apply 1 application topically as needed (before treatment). ) 30 g 2  . lisinopril (PRINIVIL,ZESTRIL) 10 MG tablet Take 1  tablet (10 mg total) by mouth daily. (Patient taking differently: Take 10 mg by mouth daily as needed (for high blood pressure). ) 90 tablet 3  . LORazepam (ATIVAN) 0.5 MG tablet Take 0.5 tablets (0.25 mg total) by mouth 2 (two) times daily. 60 tablet 0  . metoprolol succinate (TOPROL-XL) 50 MG 24 hr tablet Take 1 tablet (50 mg total) by mouth daily. 90 tablet 3  . mirtazapine (REMERON) 15 MG tablet Take 1 tablet (15 mg total) by mouth at bedtime. (Patient taking differently:  Take 15 mg by mouth at bedtime as needed (for sleep). ) 15 tablet 0  . ondansetron (ZOFRAN) 4 MG tablet Take 1 tablet (4 mg total) by mouth every 8 (eight) hours as needed for nausea or vomiting. 60 tablet 1  . oxyCODONE-acetaminophen (PERCOCET/ROXICET) 5-325 MG tablet Take 1-2 tablets by mouth every 4 (four) hours as needed for moderate pain. 30 tablet 0  . Tamsulosin HCl (FLOMAX) 0.4 MG CAPS Take 0.4 mg by mouth daily after supper.    . warfarin (COUMADIN) 5 MG tablet 0.5 tablet (2.44m) by mouth 5 days a week. Starting on Monday 10/3015. (Patient taking differently: Take 2.5 mg by mouth every other day. In the evening) 90 tablet 0  . fentaNYL (DURAGESIC - DOSED MCG/HR) 12 MCG/HR Place 1 patch (12.5 mcg total) onto the skin every 3 (three) days. (Patient not taking: Reported on 11/22/2015) 10 patch 0  . omeprazole (PRILOSEC) 20 MG capsule Take 1 capsule (20 mg total) by mouth 2 (two) times daily before a meal. (Patient not taking: Reported on 11/22/2015) 60 capsule 3  . predniSONE (DELTASONE) 20 MG tablet Take 1 tablet (20 mg total) by mouth daily with breakfast. (Patient not taking: Reported on 11/22/2015) 30 tablet 3    Review of Systems: GENERAL:  Feels worse today.  Increased fatigue.  No fevers or sweats.  PERFORMANCE STATUS (ECOG):  2 HEENT:  Vocal cord paralysis.  No visual changes, runny nose, sore throat, mouth sores or tenderness. Lungs:  Shortness of breath, increased.  No cough.  No hemoptysis. Cardiac:  No chest pain, palpitations, orthopnea, or PND. GI:  Poor appetite.  Dysphagia.  No nausea, vomiting, diarrhea, constipation, melena or hematochezia. GU:  No urgency, frequency, dysuria, or hematuria. Musculoskeletal:  No back pain.  No joint pain.  No muscle tenderness. Extremities:  Increased bilateral leg swelling. Skin:  No rashes or skin changes. Neuro:  No headache, numbness or weakness, balance or coordination issues. Endocrine:  No diabetes, thyroid issues, hot flashes or night  sweats. Psych:  No mood changes, depression or anxiety. Pain:  No focal pain. Review of systems:  All other systems reviewed and found to be negative.  Physical Exam:  Blood pressure 114/68, pulse 103, temperature 97.8 F (36.6 C), temperature source Oral, resp. rate 20, height _0  (1.676 m), weight 182 lb 12.8 oz (82.918 kg), SpO2 98 %.  GENERAL:  Well developed, well nourished, lying comfortably on the medical unit in no acute distress.  He speaks in partial sentences secondary to vocal cord paralysis. MENTAL STATUS:  Alert and oriented to person, place and time. HEAD:  GPearline Cableshair.  Normocephalic, atraumatic, face symmetric, no Cushingoid features. EYES:  Blue eyes.  Pupils equal round and reactive to light and accomodation.  No conjunctivitis or scleral icterus. ENT:  Oropharynx clear without lesion.  Tongue normal. Mucous membranes moist.  RESPIRATORY:  Decreased respiratory excursion.  Decreased breath 1/2 way up on the left.  No rales, wheezes  or rhonchi. CARDIOVASCULAR:  Irregular rate and rhythm without murmur, rub or gallop. ABDOMEN:  Soft, non-tender, with active bowel sounds, and no hepatosplenomegaly.  No masses. SKIN:  No rashes, ulcers or lesions. EXTREMITIES: Bilateral 3+ lower extremity pitting edema.  No skin discoloration or tenderness.  No palpable cords. LYMPH NODES: No palpable cervical, supraclavicular, axillary or inguinal adenopathy  NEUROLOGICAL: Unremarkable. PSYCH:  Appropriate.  Results for orders placed or performed during the hospital encounter of 11/22/15 (from the past 48 hour(s))  Osmolality, urine     Status: None   Collection Time: 11/24/15  7:11 AM  Result Value Ref Range   Osmolality, Ur 608 300 - 900 mOsm/kg  Osmolality     Status: None   Collection Time: 11/24/15  9:12 AM  Result Value Ref Range   Osmolality 293 275 - 295 mOsm/kg  Protime-INR     Status: Abnormal   Collection Time: 11/25/15  7:12 AM  Result Value Ref Range   Prothrombin Time  16.8 (H) 11.4 - 15.0 seconds   INR 4.65   Basic metabolic panel     Status: Abnormal   Collection Time: 11/25/15  7:12 AM  Result Value Ref Range   Sodium 138 135 - 145 mmol/L   Potassium 6.5 (H) 3.5 - 5.1 mmol/L   Chloride 110 101 - 111 mmol/L   CO2 19 (L) 22 - 32 mmol/L   Glucose, Bld 120 (H) 65 - 99 mg/dL   BUN 35 (H) 6 - 20 mg/dL   Creatinine, Ser 1.99 (H) 0.61 - 1.24 mg/dL   Calcium 7.9 (L) 8.9 - 10.3 mg/dL   GFR calc non Af Amer 30 (L) >60 mL/min   GFR calc Af Amer 35 (L) >60 mL/min    Comment: (NOTE) The eGFR has been calculated using the CKD EPI equation. This calculation has not been validated in all clinical situations. eGFR's persistently <60 mL/min signify possible Chronic Kidney Disease.    Anion gap 9 5 - 15  Basic metabolic panel     Status: Abnormal   Collection Time: 11/25/15 12:25 PM  Result Value Ref Range   Sodium 135 135 - 145 mmol/L   Potassium 5.2 (H) 3.5 - 5.1 mmol/L   Chloride 110 101 - 111 mmol/L   CO2 16 (L) 22 - 32 mmol/L   Glucose, Bld 128 (H) 65 - 99 mg/dL   BUN 37 (H) 6 - 20 mg/dL   Creatinine, Ser 2.01 (H) 0.61 - 1.24 mg/dL   Calcium 7.7 (L) 8.9 - 10.3 mg/dL   GFR calc non Af Amer 30 (L) >60 mL/min   GFR calc Af Amer 35 (L) >60 mL/min    Comment: (NOTE) The eGFR has been calculated using the CKD EPI equation. This calculation has not been validated in all clinical situations. eGFR's persistently <60 mL/min signify possible Chronic Kidney Disease.    Anion gap 9 5 - 15   Dg Chest 2 View  11/25/2015  CLINICAL DATA:  81 year old male with metastatic left lung cancer. Weakness, dizziness and extreme shortness of breath. Initial encounter. EXAM: CHEST  2 VIEW COMPARISON:  11/22/2015 and earlier. FINDINGS: Seated AP and lateral views of the chest. Moderate to large left pleural effusion is stable since 11/22/2015. Stable cardiac size and mediastinal contours. Right chest porta cath remains in place. Stable pulmonary vascularity without overt edema.  No pneumothorax. No right pleural effusion or new pulmonary opacity. Sequelae of CABG. No acute osseous abnormality identified. IMPRESSION: Stable with moderate to large  left pleural effusion since 11/22/2015. No new cardiopulmonary abnormality. Electronically Signed   By: Genevie Ann M.D.   On: 11/25/2015 13:51    Assessment:  The patient is a 81 y.o. gentleman with stage IV lung cancer admitted with dehydration and renal failure.  He was diagnosed on 09/25/2015.  He has received 1 cycle of carboplatin and Taxol (11/02/2015) and is currently day 11 status post cycle #1 Keytruda (11/15/2015).  Echocardiogram on 07/30/2015 revealed an EF of 60-65%.  Symptomatically, he has a poor appetite and subsequent oral intake. He has lost 39 pounds since initial presentation with his lung cancer.     Labs on admission were notable for acute renal failure (BUN 41/creainine 2.5).  With IVF, BUN/Cr have improved to 37/2.01 today.   Baseline BUN/creatinine was 21/1.19 on 11/15/2015.  Urinalysis is benign with no evidence of nephritis.  Potassium is elevated (6.5 to 5.2 today after Lasix;  Potassium out of fluids).  He has increasing lower extremity edema.  On exam, the left lower lobe effusion appears increased and may be contributing to shortness of breath.  INR  Has normalized after vitamin K yesterday (INR was 5.38 on 11/23/2015).  Plan:   1.  Hematology/Oncology:  Metastatic lung cancer day 11 s/p Keytruda.  Next outpatient dose due 11/30/2015.  Counts from 11/23/2015 adequate.  INR normalized after vitamin K.  Anticipate reinstitution of anticoagulation for atrial fibrillation.  2.  Fluids/Electrolytes/Nutrition:  Renal function slightly improved.  IVF held and Lasix given this morning for worsening shortness of breath.  May need therapeutic thoracentesis.  Sodium normal.  Potassium elevated (improved after Lasix).  Modified barium swallow planned to assess dysphagia.  Thank you for allowing me to participate in  TAE VONADA 's care.  I will follow him closely with you while hospitalized.   Lequita Asal, MD  11/25/2015, 2:56 PM

## 2015-11-25 NOTE — Plan of Care (Signed)
Problem: Education: Goal: Knowledge of [place] [facility] Education information/materials will improve Outcome: Progressing 1. Pt received orders in [facility] for US Thoracentesis on 11/26/15, procedure information/education given to pt this shift.

## 2015-11-26 ENCOUNTER — Inpatient Hospital Stay: Payer: Medicare Other

## 2015-11-26 ENCOUNTER — Inpatient Hospital Stay: Payer: Medicare Other | Admitting: Oncology

## 2015-11-26 DIAGNOSIS — R5381 Other malaise: Secondary | ICD-10-CM

## 2015-11-26 DIAGNOSIS — E875 Hyperkalemia: Secondary | ICD-10-CM

## 2015-11-26 LAB — BODY FLUID CELL COUNT WITH DIFFERENTIAL
EOS FL: 0 %
LYMPHS FL: 75 %
Monocyte-Macrophage-Serous Fluid: 2 %
NEUTROPHIL FLUID: 22 %
OTHER CELLS FL: 1 %
Total Nucleated Cell Count, Fluid: 36 cu mm

## 2015-11-26 LAB — CBC
HEMATOCRIT: 36 % — AB (ref 40.0–52.0)
Hemoglobin: 11.8 g/dL — ABNORMAL LOW (ref 13.0–18.0)
MCH: 26.8 pg (ref 26.0–34.0)
MCHC: 32.7 g/dL (ref 32.0–36.0)
MCV: 81.9 fL (ref 80.0–100.0)
PLATELETS: 216 10*3/uL (ref 150–440)
RBC: 4.39 MIL/uL — ABNORMAL LOW (ref 4.40–5.90)
RDW: 17.1 % — AB (ref 11.5–14.5)
WBC: 8.6 10*3/uL (ref 3.8–10.6)

## 2015-11-26 LAB — CREATININE, SERUM
Creatinine, Ser: 2.18 mg/dL — ABNORMAL HIGH (ref 0.61–1.24)
GFR calc Af Amer: 31 mL/min — ABNORMAL LOW (ref >60)
GFR calc non Af Amer: 27 mL/min — ABNORMAL LOW (ref >60)

## 2015-11-26 LAB — GLUCOSE, SEROUS FLUID: Glucose, Fluid: 116 mg/dL

## 2015-11-26 LAB — PROTEIN, BODY FLUID: Total protein, fluid: 3.2 g/dL

## 2015-11-26 LAB — PROTIME-INR
INR: 1.36
PROTHROMBIN TIME: 16.9 s — AB (ref 11.4–15.0)

## 2015-11-26 MED ORDER — FUROSEMIDE 10 MG/ML IJ SOLN
40.0000 mg | Freq: Once | INTRAMUSCULAR | Status: AC
  Administered 2015-11-26: 40 mg via INTRAVENOUS
  Filled 2015-11-26: qty 4

## 2015-11-26 MED ORDER — BOOST / RESOURCE BREEZE PO LIQD: 1.0000 | ORAL | Status: DC

## 2015-11-26 MED ORDER — CITALOPRAM HYDROBROMIDE 20 MG PO TABS
20.0000 mg | ORAL_TABLET | Freq: Every day | ORAL | Status: DC
Start: 2015-11-26 — End: 2015-11-29
  Administered 2015-11-26 – 2015-11-29 (×4): 20 mg via ORAL
  Filled 2015-11-26 (×4): qty 1

## 2015-11-26 MED ORDER — MORPHINE SULFATE (PF) 2 MG/ML IV SOLN
2.0000 mg | INTRAVENOUS | Status: DC | PRN
  Administered 2015-11-26: 04:00:00 2 mg via INTRAVENOUS
  Filled 2015-11-26: qty 1

## 2015-11-26 NOTE — Progress Notes (Signed)
No improvement in sob with svn tx

## 2015-11-26 NOTE — Progress Notes (Signed)
Nutrition Follow-up  DOCUMENTATION CODES:   Severe malnutrition in context of chronic illness  INTERVENTION:   Meals and Snacks: Cater to patient preferences on Dysphagia I diet order, SLP following  Medical Food Supplement Therapy: Continue Magic Cup as ordered, will recommend Boost Breeze po daily, each supplement provides 250 kcal and 9 grams of protein, at this time as pt remains on thin liquids, will modify as needed s/p MBSS. RD aware that pt only likes Dark chocolate Ensure per report. Coordination of Care: will recommend collecting daily weights, notably after thoracentesis   NUTRITION DIAGNOSIS:   Malnutrition related to chronic illness as evidenced by energy intake < or equal to 75% for > or equal to 1 month, percent weight loss, moderate depletions of muscle mass.  GOAL:   Patient will meet greater than or equal to 90% of their needs; ongoing  MONITOR:    (Energy Intake, Electrolyte and renal Profile, Pulmonary Profile, Anthropometrics, Digestive System)  REASON FOR ASSESSMENT:   Consult Assessment of nutrition requirement/status  ASSESSMENT:   Pt admitted with acute renal failure and SOB. Pt with h/o stage IV lung cancer with known mets.  Pt scheduled for MBSS today however cancelled secondary to other procedures. Pt scheduled for thoracentesis today. MD in with pt this afternoon on rounds.  Diet Order:  DIET - DYS 1 Room service appropriate?: Yes; Fluid consistency:: Thin   Current Nutrition: Pt with poor po intake, 0% recorded this am, bites of meals yesterday.   Gastrointestinal Profile: Last BM:  11/25/2015   Scheduled Medications:  . aspirin EC  81 mg Oral Daily  . [START ON 11/27/2015] feeding supplement  1 Container Oral Q24H  . guaiFENesin  600 mg Oral BID  . LORazepam  0.25 mg Oral BID  . sodium polystyrene  30 g Oral BID  . tamsulosin  0.4 mg Oral QPC supper     Electrolyte/Renal Profile and Glucose Profile:   Recent Labs Lab 11/22/15 1638   11/25/15 0712 11/25/15 1225 11/25/15 1835 11/26/15 0638  NA 133*  < > 138 135 136  --   K 4.7  < > 6.5* 5.2* 4.8  --   CL 104  < > 110 110 108  --   CO2 19*  < > 19* 16* 19*  --   BUN 41*  < > 35* 37* 36*  --   CREATININE 2.50*  < > 1.99* 2.01* 1.91* 2.18*  CALCIUM 7.7*  < > 7.9* 7.7* 7.7*  --   MG 2.0  --   --   --   --   --   GLUCOSE 104*  < > 120* 128* 124*  --   < > = values in this interval not displayed. Protein Profile:  Recent Labs Lab 11/22/15 1638 11/25/15 1835  ALBUMIN 2.5* 2.2*    Filed Weights   11/22/15 1558 11/22/15 2140  Weight: 160 lb (72.576 kg) 182 lb 12.8 oz (82.918 kg)     BMI:  Body mass index is 29.52 kg/(m^2).  Estimated Nutritional Needs:   Kcal:  BEE: 1472kcals, TEE: (IF 1.1-1.3)(AF 1.2) 1944-2297kcals  Protein:  90-107g protein (1.1-1.3g/kg)  Fluid:  2050-2431m of fluid (25-39mkg)  EDUCATION NEEDS:   No education needs identified at this time   HIDudleyvilleRD, LDN Pager ([phone]/On-Call Pager ([phone]

## 2015-11-26 NOTE — Progress Notes (Signed)
Speech Therapy Note: reviewed chart notes. Order had been placed for a MBSS today, however, pt is receiving another procedure. MD consulted and stated for MBSS to be cancelled for today and rescheduled when appropriate d/t today's procedure. NSG updated. ST will f/u tomorrow w/ pt's status and readiness for MBSS. Rec. Continue w/ strict aspiration precautions w/ any po's/meals. NSG agreed.

## 2015-11-26 NOTE — Progress Notes (Signed)
[facility]  Date of admission:  11/22/2015  Inpatient day:  11/26/2015  Consulting physician:  Valentino Nose, MD   Reason for Consultation:  Lung cancer  Chief Complaint: [name] is a 81 y.o. male with stage IV lung cancer who was admitted with hypotension and acute renal failure.  Subjective:  Increased shortness of breath today.  Increasing leg edema.  No chest pain.  Dysphagia. Patient is now being evaluated at hyperkalemia patient was started on The late Lasix Kayexalate is making patient dehydrated again.  Potassium is decreased.  Shortness of breath is improved.  Patient is for thoracentesis today and for dysphagia for swallowing study tomorrow.  Patient is somewhat depressed  Past Medical History  Diagnosis Date  . Hypertension   . Coronary artery disease   . Atrial fibrillation ([place]) 07/2015  . Primary cancer of left upper lobe of lung ([place]) 10/06/2015    Past Surgical History  Procedure Laterality Date  . Coronary artery bypass graft  12/10/2005    LIMA to LAD,SVG to diagonal,SVG to ramus intermediate,SVG to distal RCA.  . Tonsillectomy    . Tee without cardioversion  05/10/2012    Procedure: TRANSESOPHAGEAL ECHOCARDIOGRAM (TEE);  Surgeon: [name], MD;  Location: [facility] ENDOSCOPY;  Service: Cardiovascular;  Laterality: N/A;  . Cardioversion  05/10/2012    Procedure: CARDIOVERSION;  Surgeon: [name], MD;  Location: [facility] ENDOSCOPY;  Service: Cardiovascular;  Laterality: N/A;  . Cardiac catheterization  10/28/2005    significant 3 vessel CAD  . Nm myocar perf wall motion  09/16/2005    evidence of diaphragmatic attenuation & subtle anterolateral ischemia.  . Cardioversion N/A 07/31/2015    Procedure: CARDIOVERSION;  Surgeon: Josue Hector, MD;  Location: [facility] ENDOSCOPY;  Service: Cardiovascular;  Laterality: N/A;  . Peripheral vascular catheterization N/A 10/18/2015    Procedure: Glori Luis Cath Insertion;  Surgeon: Algernon Huxley, MD;   Location: [place] CV LAB;  Service: Cardiovascular;  Laterality: N/A;    Family History  Problem Relation Age of Onset  . Stroke Mother 2  . Stroke Father 76  . Heart attack Father 30  . Heart attack Brother 69  . Cancer Sister 39    Social History:  reports that he quit smoking about 10 years ago. His smoking use included Cigarettes. He has a 40 pack-year smoking history. He has never used smokeless tobacco. He reports that he does not drink alcohol or use illicit drugs.  The patient is accompanied by his wife and brother today.  Allergies:  Allergies  Allergen Reactions  . Clonidine Derivatives Other (See Comments)    Pt was not aware of a reaction to this medication  . Diltiazem Hcl Er Other (See Comments)    High blood pressure  . Statins Other (See Comments)    Myalgias:  Previously on Crestor,Vytorin,Simvastatin,Lipitor & Pravastatin  . Sulfa Antibiotics Swelling    Medications Prior to Admission  Medication Sig Dispense Refill  . aspirin EC 81 MG tablet Take 81 mg by mouth daily.    Marland Kitchen guaiFENesin (MUCINEX) 600 MG 12 hr tablet Take 1 tablet (600 mg total) by mouth 2 (two) times daily. 60 tablet 0  . HYDROcodone-acetaminophen (NORCO/VICODIN) 5-325 MG tablet Take 1 tablet by mouth every 6 (six) hours as needed for moderate pain. 60 tablet 0  . ipratropium-albuterol (DUONEB) 0.5-2.5 (3) MG/3ML SOLN Take 3 mLs by nebulization every 4 (four) hours as needed. (Patient taking differently: Take 3 mLs by nebulization every  4 (four) hours as needed (for shortness of breath). ) 360 mL 3  . lidocaine-prilocaine (EMLA) cream Apply 1 application topically as needed. (Patient taking differently: Apply 1 application topically as needed (before treatment). ) 30 g 2  . lisinopril (PRINIVIL,ZESTRIL) 10 MG tablet Take 1 tablet (10 mg total) by mouth daily. (Patient taking differently: Take 10 mg by mouth daily as needed (for high blood pressure). ) 90 tablet 3  . LORazepam (ATIVAN) 0.5 MG  tablet Take 0.5 tablets (0.25 mg total) by mouth 2 (two) times daily. 60 tablet 0  . metoprolol succinate (TOPROL-XL) 50 MG 24 hr tablet Take 1 tablet (50 mg total) by mouth daily. 90 tablet 3  . mirtazapine (REMERON) 15 MG tablet Take 1 tablet (15 mg total) by mouth at bedtime. (Patient taking differently: Take 15 mg by mouth at bedtime as needed (for sleep). ) 15 tablet 0  . ondansetron (ZOFRAN) 4 MG tablet Take 1 tablet (4 mg total) by mouth every 8 (eight) hours as needed for nausea or vomiting. 60 tablet 1  . oxyCODONE-acetaminophen (PERCOCET/ROXICET) 5-325 MG tablet Take 1-2 tablets by mouth every 4 (four) hours as needed for moderate pain. 30 tablet 0  . Tamsulosin HCl (FLOMAX) 0.4 MG CAPS Take 0.4 mg by mouth daily after supper.    . warfarin (COUMADIN) 5 MG tablet 0.5 tablet (2.'5mg'$ ) by mouth 5 days a week. Starting on Monday 10/3015. (Patient taking differently: Take 2.5 mg by mouth every other day. In the evening) 90 tablet 0  . fentaNYL (DURAGESIC - DOSED MCG/HR) 12 MCG/HR Place 1 patch (12.5 mcg total) onto the skin every 3 (three) days. (Patient not taking: Reported on 11/22/2015) 10 patch 0  . omeprazole (PRILOSEC) 20 MG capsule Take 1 capsule (20 mg total) by mouth 2 (two) times daily before a meal. (Patient not taking: Reported on 11/22/2015) 60 capsule 3  . predniSONE (DELTASONE) 20 MG tablet Take 1 tablet (20 mg total) by mouth daily with breakfast. (Patient not taking: Reported on 11/22/2015) 30 tablet 3    Review of Systems: GENERAL:  Feels worse today.  Increased fatigue.  No fevers or sweats.  PERFORMANCE STATUS (ECOG):  2 HEENT:  Vocal cord paralysis.  No visual changes, runny nose, sore throat, mouth sores or tenderness. Lungs:  Shortness of breath, increased.  No cough.  No hemoptysis. Cardiac:  No chest pain, palpitations, orthopnea, or PND. GI:  Poor appetite.  Dysphagia.  No nausea, vomiting, diarrhea, constipation, melena or hematochezia. GU:  No urgency, frequency, dysuria,  or hematuria. Musculoskeletal:  No back pain.  No joint pain.  No muscle tenderness. Extremities:  Increased bilateral leg swelling. Skin:  No rashes or skin changes. Neuro:  No headache, numbness or weakness, balance or coordination issues. Endocrine:  No diabetes, thyroid issues, hot flashes or night sweats. Psych:  No mood changes, depression or anxiety. Pain:  No focal pain. Review of systems:  All other systems reviewed and found to be negative.  Physical Exam:  Blood pressure 112/69, pulse 108, temperature 98 F (36.7 C), temperature source Oral, resp. rate 24, height '5\' 6"'$  (1.676 m), weight 182 lb 12.8 oz (82.918 kg), SpO2 98 %.  GENERAL:  Well developed, well nourished, lying comfortably on the medical unit in no acute distress.  He speaks in partial sentences secondary to vocal cord paralysis. MENTAL STATUS:  Alert and oriented to person, place and time. HEAD:  Pearline Cables hair.  Normocephalic, atraumatic, face symmetric, no Cushingoid features. EYES:  Blue  eyes.  Pupils equal round and reactive to light and accomodation.  No conjunctivitis or scleral icterus. ENT:  Oropharynx clear without lesion.  Tongue normal. Mucous membranes moist.  RESPIRATORY:  Decreased respiratory excursion.  Decreased breath 1/2 way up on the left.  No rales, wheezes or rhonchi. CARDIOVASCULAR:  Irregular rate and rhythm without murmur, rub or gallop. ABDOMEN:  Soft, non-tender, with active bowel sounds, and no hepatosplenomegaly.  No masses. SKIN:  No rashes, ulcers or lesions. EXTREMITIES: Bilateral 3+ lower extremity pitting edema.  No skin discoloration or tenderness.  No palpable cords. LYMPH NODES: No palpable cervical, supraclavicular, axillary or inguinal adenopathy  NEUROLOGICAL: Unremarkable. PSYCH:  Appropriate.  Results for orders placed or performed during the hospital encounter of 11/22/15 (from the past 48 hour(s))  Protime-INR     Status: Abnormal   Collection Time: 11/25/15  7:12 AM  Result  Value Ref Range   Prothrombin Time 16.8 (H) 11.4 - 15.0 seconds   INR 0.62   Basic metabolic panel     Status: Abnormal   Collection Time: 11/25/15  7:12 AM  Result Value Ref Range   Sodium 138 135 - 145 mmol/L   Potassium 6.5 (H) 3.5 - 5.1 mmol/L   Chloride 110 101 - 111 mmol/L   CO2 19 (L) 22 - 32 mmol/L   Glucose, Bld 120 (H) 65 - 99 mg/dL   BUN 35 (H) 6 - 20 mg/dL   Creatinine, Ser 1.99 (H) 0.61 - 1.24 mg/dL   Calcium 7.9 (L) 8.9 - 10.3 mg/dL   GFR calc non Af Amer 30 (L) >60 mL/min   GFR calc Af Amer 35 (L) >60 mL/min    Comment: (NOTE) The eGFR has been calculated using the CKD EPI equation. This calculation has not been validated in all clinical situations. eGFR's persistently <60 mL/min signify possible Chronic Kidney Disease.    Anion gap 9 5 - 15  Basic metabolic panel     Status: Abnormal   Collection Time: 11/25/15 12:25 PM  Result Value Ref Range   Sodium 135 135 - 145 mmol/L   Potassium 5.2 (H) 3.5 - 5.1 mmol/L   Chloride 110 101 - 111 mmol/L   CO2 16 (L) 22 - 32 mmol/L   Glucose, Bld 128 (H) 65 - 99 mg/dL   BUN 37 (H) 6 - 20 mg/dL   Creatinine, Ser 2.01 (H) 0.61 - 1.24 mg/dL   Calcium 7.7 (L) 8.9 - 10.3 mg/dL   GFR calc non Af Amer 30 (L) >60 mL/min   GFR calc Af Amer 35 (L) >60 mL/min    Comment: (NOTE) The eGFR has been calculated using the CKD EPI equation. This calculation has not been validated in all clinical situations. eGFR's persistently <60 mL/min signify possible Chronic Kidney Disease.    Anion gap 9 5 - 15  Comprehensive metabolic panel     Status: Abnormal   Collection Time: 11/25/15  6:35 PM  Result Value Ref Range   Sodium 136 135 - 145 mmol/L   Potassium 4.8 3.5 - 5.1 mmol/L   Chloride 108 101 - 111 mmol/L   CO2 19 (L) 22 - 32 mmol/L   Glucose, Bld 124 (H) 65 - 99 mg/dL   BUN 36 (H) 6 - 20 mg/dL   Creatinine, Ser 1.91 (H) 0.61 - 1.24 mg/dL   Calcium 7.7 (L) 8.9 - 10.3 mg/dL   Total Protein 6.0 (L) 6.5 - 8.1 g/dL   Albumin 2.2 (L)  3.5 - 5.0  g/dL   AST 24 15 - 41 U/L   ALT 21 17 - 63 U/L   Alkaline Phosphatase 88 38 - 126 U/L   Total Bilirubin 0.8 0.3 - 1.2 mg/dL   GFR calc non Af Amer 32 (L) >60 mL/min   GFR calc Af Amer 37 (L) >60 mL/min    Comment: (NOTE) The eGFR has been calculated using the CKD EPI equation. This calculation has not been validated in all clinical situations. eGFR's persistently <60 mL/min signify possible Chronic Kidney Disease.    Anion gap 9 5 - 15  Protime-INR     Status: Abnormal   Collection Time: 11/26/15  6:38 AM  Result Value Ref Range   Prothrombin Time 16.9 (H) 11.4 - 15.0 seconds   INR 1.36   CBC     Status: Abnormal   Collection Time: 11/26/15  6:38 AM  Result Value Ref Range   WBC 8.6 3.8 - 10.6 K/uL   RBC 4.39 (L) 4.40 - 5.90 MIL/uL   Hemoglobin 11.8 (L) 13.0 - 18.0 g/dL   HCT 36.0 (L) 40.0 - 52.0 %   MCV 81.9 80.0 - 100.0 fL   MCH 26.8 26.0 - 34.0 pg   MCHC 32.7 32.0 - 36.0 g/dL   RDW 17.1 (H) 11.5 - 14.5 %   Platelets 216 150 - 440 K/uL  Creatinine, serum     Status: Abnormal   Collection Time: 11/26/15  6:38 AM  Result Value Ref Range   Creatinine, Ser 2.18 (H) 0.61 - 1.24 mg/dL   GFR calc non Af Amer 27 (L) >60 mL/min   GFR calc Af Amer 31 (L) >60 mL/min    Comment: (NOTE) The eGFR has been calculated using the CKD EPI equation. This calculation has not been validated in all clinical situations. eGFR's persistently <60 mL/min signify possible Chronic Kidney Disease.   Blood gas, arterial     Status: Abnormal (Preliminary result)   Collection Time: 11/26/15  8:33 AM  Result Value Ref Range   FIO2 0.21    Delivery systems PENDING    Inspiratory PAP PENDING    Expiratory PAP PENDING    pH, Arterial 7.42 7.350 - 7.450   pCO2 arterial 26 (L) 32.0 - 48.0 mmHg   pO2, Arterial 64 (L) 83.0 - 108.0 mmHg   Bicarbonate 16.9 (L) 21.0 - 28.0 mEq/L   Acid-base deficit 6.2 (H) 0.0 - 2.0 mmol/L   O2 Saturation 92.5 %   Patient temperature 37.0    Oxygen index  PENDING    Collection site RIGHT RADIAL    Sample type ARTERIAL DRAW    Allens test (pass/fail) POSITIVE (A) PASS   Dg Chest 2 View  11/25/2015  CLINICAL DATA:  81 year old male with metastatic left lung cancer. Weakness, dizziness and extreme shortness of breath. Initial encounter. EXAM: CHEST  2 VIEW COMPARISON:  11/22/2015 and earlier. FINDINGS: Seated AP and lateral views of the chest. Moderate to large left pleural effusion is stable since 11/22/2015. Stable cardiac size and mediastinal contours. Right chest porta cath remains in place. Stable pulmonary vascularity without overt edema. No pneumothorax. No right pleural effusion or new pulmonary opacity. Sequelae of CABG. No acute osseous abnormality identified. IMPRESSION: Stable with moderate to large left pleural effusion since 11/22/2015. No new cardiopulmonary abnormality. Electronically Signed   By: Genevie Ann M.D.   On: 11/25/2015 13:51   Dg Chest Port 1 View  11/26/2015  CLINICAL DATA:  Dyspnea, recent diagnosis of left upper lobe lung  malignancy, acute renal failure, history of CABG, former smoker. EXAM: PORTABLE CHEST 1 VIEW COMPARISON:  PA and lateral chest x-ray of November 25, 2015 FINDINGS: Further opacification of the left hemi thorax has occurred likely due to pleural fluid new layering posteriorly. There is no significant mediastinal shift toward the right. The right lung is clear. Only a small portion of the left upper lobe remains aerated. The cardiac silhouette appears enlarged but its margins on the left are indistinct. There is mild central pulmonary vascular prominence. The Port-A-Cath appliance tip projects over the proximal SVC. The bony thorax exhibits no acute abnormality. IMPRESSION: Increased left pleural effusion may be in part due to the portable technique though the film is labeled "erect". Yesterday's study was AP "sitting" in position. Worsening of probable left upper lobe atelectasis. If the patient's clinical status has  significantly deteriorated, ultrasound of the thorax may be useful to judge whether there is significant pleural fluid volume warranting thoracentesis. Electronically Signed   By: David  Martinique M.D.   On: 11/26/2015 08:02    Assessment:  The patient is a 81 y.o. gentleman with stage IV lung cancer admitted with dehydration and renal failure.  He was diagnosed on 09/25/2015.  He has received 1 cycle of carboplatin and Taxol (11/02/2015) and is currently day 11 status post cycle #1 Keytruda (11/15/2015).  Echocardiogram on 07/30/2015 revealed an EF of 60-65%.  Symptomatically, he has a poor appetite and subsequent oral intake. He has lost 39 pounds since initial presentation with his lung cancer.     Labs on admission were notable for acute renal failure (BUN 41/creainine 2.5).  With IVF, BUN/Cr have improved to 37/2.01 today.   Baseline BUN/creatinine was 21/1.19 on 11/15/2015.  Urinalysis is benign with no evidence of nephritis.  Potassium is elevated (6.5 to 5.2 today after Lasix;  Potassium out of fluids).  He has increasing lower extremity edema.  On exam, the left lower lobe effusion appears increased and may be contributing to shortness of breath.  INR  Has normalized after vitamin K yesterday (INR was 5.38 on 11/23/2015).  Plan:   1.  Hematology/Oncology:  Metastatic lung cancer day 11 s/p Keytruda.  Next outpatient dose due 11/30/2015.  Counts from 11/23/2015 adequate.  INR normalized after vitamin K.  Anticipate reinstitution of anticoagulation for .  2.increasing shortness of breath Improving after Lasix.  Patient has been scheduled for thoracentesis today 3.  Hypokalemia corrected discontinue The late discontinue Lasix as creatinine is going up. Depression: start patient on anti-depression.  Thank you for allowing me to participate in PAVEL GADD 's care.  I will follow him closely with you while hospitalized.   Forest Gleason, MD  11/26/2015, 1:47 PM

## 2015-11-26 NOTE — Progress Notes (Signed)
[place] [place] NAME: [name]    MR#:  [id number]  DATE OF BIRTH:  [birth year]  SUBJECTIVE:  CHIEF COMPLAINT:   Chief Complaint  Patient presents with  . Hypotension  . Dizziness   patient is 81 year old male with past medical history significant for history of essential hypertension, coronary artery disease, atrial fibrillation, on Coumadin therapy, stage IV lung cancer with metastasis who presents to the hospital with very poor oral intake, dizziness and generalized weakness. He was seen by Dr. Oliva Bustard and was found to have hypotension with systolic blood pressure in 80s. He was sent to emergency room and was admitted. Labs revealed acute renal failure, creatinine of 2.5, baseline being at 1.2-1.6. Patient was given IV fluids and his creatinine improved. He is noted to be more short of breath and repeated chest x-ray revealed worsening left pleural effusion . Patient's IV fluids were stopped and Lasix IV was administered with some improvement of his respiratory status. Left thoracentesis was scheduled for today.  Remains on oxygen therapy , very uncomfortable, dyspneic. ABGs revealed respiratory alkalosis , metabolic acidosis, compensated. Labs showed renal failure with creatinine of 2.18 Review of Systems  Constitutional: Negative for fever, chills and weight loss.  HENT: Negative for congestion.   Eyes: Negative for blurred vision and double vision.  Respiratory: Positive for cough. Negative for sputum production, shortness of breath and wheezing.   Cardiovascular: Negative for chest pain, palpitations, orthopnea, leg swelling and PND.  Gastrointestinal: Positive for constipation. Negative for nausea, vomiting, abdominal pain, diarrhea and blood in stool.  Genitourinary: Negative for dysuria, urgency, frequency and hematuria.  Musculoskeletal: Negative for falls.  Neurological: Negative for dizziness, tremors, focal weakness and  headaches.  Endo/Heme/Allergies: Does not bruise/bleed easily.  Psychiatric/Behavioral: Negative for depression. The patient does not have insomnia.     VITAL SIGNS: Blood pressure 112/69, pulse 108, temperature 98 F (36.7 C), temperature source Oral, resp. rate 24, height '5\' 6"'$  (1.676 m), weight 82.918 kg (182 lb 12.8 oz), SpO2 98 %.  PHYSICAL EXAMINATION:   GENERAL:  81 y.o.-year-old patient lying in the bed in moderate respiratory distress. Dysarthric, gasping for air, the uncomfortable gray discoloration of skin EYES: Pupils equal, round, reactive to light and accommodation. No scleral icterus. Extraocular muscles intact.  HEENT: Head atraumatic, normocephalic. Oropharynx and nasopharynx clear.  NECK:  Supple, no jugular venous distention. No thyroid enlargement, no tenderness.  LUNGS: Markedly diminished breath sounds  on the left side, dullness to percussion on the left,  scattered wheezing, basilar rales,rhonchi or crepitation noted intermittently, scattered. Using accessory muscles of respiration, especially with speech  CARDIOVASCULAR: S1, S2 normal. No murmurs, rubs, or gallops.  ABDOMEN: Soft, nontender, nondistended. Bowel sounds present. No organomegaly or mass.  EXTREMITIES: No pedal edema, cyanosis, or clubbing.  NEUROLOGIC: Cranial nerves II through XII are intact. Muscle strength 5/5 in all extremities. Sensation intact. Gait not checked.  PSYCHIATRIC: The patient is alert and oriented x 3.  SKIN: No obvious rash, lesion, or ulcer.   ORDERS/RESULTS REVIEWED:   CBC  Recent Labs Lab 11/22/15 1410 11/22/15 1638 11/23/15 0529 11/26/15 0638  WBC 9.1 7.4 6.4 8.6  HGB 12.7* 11.3* 11.4* 11.8*  HCT 38.3* 34.6* 34.8* 36.0*  PLT 196 178 168 216  MCV 80.6 79.7* 82.0 81.9  MCH 26.7 26.2 26.9 26.8  MCHC 33.1 32.8 32.8 32.7  RDW 17.1* 16.5* 17.1* 17.1*  LYMPHSABS 0.9* 0.8*  --   --  MONOABS 0.5 0.5  --   --   EOSABS 0.0 0.0  --   --   BASOSABS 0.1 0.1  --   --     ------------------------------------------------------------------------------------------------------------------  Chemistries   Recent Labs Lab 11/22/15 1638 11/23/15 0529 11/25/15 0712 11/25/15 1225 11/25/15 1835 11/26/15 0638  NA 133* 133* 138 135 136  --   K 4.7 4.6 6.5* 5.2* 4.8  --   CL 104 108 110 110 108  --   CO2 19* 17* 19* 16* 19*  --   GLUCOSE 104* 111* 120* 128* 124*  --   BUN 41* 39* 35* 37* 36*  --   CREATININE 2.50* 2.21* 1.99* 2.01* 1.91* 2.18*  CALCIUM 7.7* 7.1* 7.9* 7.7* 7.7*  --   MG 2.0  --   --   --   --   --   AST 26  --   --   --  24  --   ALT 29  --   --   --  21  --   ALKPHOS 103  --   --   --  88  --   BILITOT 0.7  --   --   --  0.8  --    ------------------------------------------------------------------------------------------------------------------ estimated creatinine clearance is 27.7 mL/min (by C-G formula based on Cr of 2.18). ------------------------------------------------------------------------------------------------------------------ No results for input(s): TSH, T4TOTAL, T3FREE, THYROIDAB in the last 72 hours.  Invalid input(s): FREET3  Cardiac Enzymes  Recent Labs Lab 11/22/15 1638  TROPONINI <0.03   ------------------------------------------------------------------------------------------------------------------ Invalid input(s): POCBNP ---------------------------------------------------------------------------------------------------------------  RADIOLOGY: Dg Chest 2 View  11/25/2015  CLINICAL DATA:  81 year old male with metastatic left lung cancer. Weakness, dizziness and extreme shortness of breath. Initial encounter. EXAM: CHEST  2 VIEW COMPARISON:  11/22/2015 and earlier. FINDINGS: Seated AP and lateral views of the chest. Moderate to large left pleural effusion is stable since 11/22/2015. Stable cardiac size and mediastinal contours. Right chest porta cath remains in place. Stable pulmonary vascularity without overt  edema. No pneumothorax. No right pleural effusion or new pulmonary opacity. Sequelae of CABG. No acute osseous abnormality identified. IMPRESSION: Stable with moderate to large left pleural effusion since 11/22/2015. No new cardiopulmonary abnormality. Electronically Signed   By: Genevie Ann M.D.   On: 11/25/2015 13:51   Dg Chest Port 1 View  11/26/2015  CLINICAL DATA:  Dyspnea, recent diagnosis of left upper lobe lung malignancy, acute renal failure, history of CABG, former smoker. EXAM: PORTABLE CHEST 1 VIEW COMPARISON:  PA and lateral chest x-ray of November 25, 2015 FINDINGS: Further opacification of the left hemi thorax has occurred likely due to pleural fluid new layering posteriorly. There is no significant mediastinal shift toward the right. The right lung is clear. Only a small portion of the left upper lobe remains aerated. The cardiac silhouette appears enlarged but its margins on the left are indistinct. There is mild central pulmonary vascular prominence. The Port-A-Cath appliance tip projects over the proximal SVC. The bony thorax exhibits no acute abnormality. IMPRESSION: Increased left pleural effusion may be in part due to the portable technique though the film is labeled "erect". Yesterday's study was AP "sitting" in position. Worsening of probable left upper lobe atelectasis. If the patient's clinical status has significantly deteriorated, ultrasound of the thorax may be useful to judge whether there is significant pleural fluid volume warranting thoracentesis. Electronically Signed   By: David  Martinique M.D.   On: 11/26/2015 08:02    EKG:  Orders placed or performed during the  hospital encounter of 11/22/15  . ED EKG  . ED EKG    ASSESSMENT AND PLAN:  Active Problems:   ARF (acute renal failure) (HCC)   Hypotension   Supratherapeutic INR #1 Acute on chronic renal failure, which was initially felt to be due to dehydration,  poor by mouth intake, now off IV fluids due to worsening left  pleural effusion. Following creatinine closely. Worse with diuresis #2 failure to thrive, adult , unclear etiology , patient reported some element of constipation, added Senokot , had bowel movement yesterday, oral intake, however, remains very poor , getting dietary involved for further recommendations   #3. Dysphagia, continue patient on dysphagia diet with thin liquids, modified barium swallowing is to be done on Tuesday, appreciate speech therapist involvement, discussed with her today #4. Hyponatremia, urine and blood osmolarities revealed inappropriate antidiuretic hormone release, SIADH, resolved With IV fluids, now patient is off IV fluids and follow sodium level today  #5. Coagulopathy, acquired, now off Coumadin, INR is 1.35 today after vitamin K administration, resume Coumadin after thoracentesis #6. Acute diastolic CHF, patient had echocardiogram done in October 2016, continue intermittent Lasix if needed after thoracentesis #7. Left pleural effusion, getting left thoracentesis today, labs after thoracentesis to rule out empyema #8. Hyperkalemia, patient was given Lasix was no significant improvement in the potassium levels, given Kayexalate orally, potassium has improved #9. Acute respiratory failure due to pleural effusion, acute diastolic CHF, continue intermittent Lasix and oxygen therapy, wean as tolerated. Thoracentesis today #10. Hypotension, likely due to large left pleural effusion and thoracic organ shift, follow after thoracentesis, give IV fluids if needed  Management plans discussed with the patient, family and they are in agreement.   DRUG ALLERGIES:  Allergies  Allergen Reactions  . Clonidine Derivatives Other (See Comments)    Pt was not aware of a reaction to this medication  . Diltiazem Hcl Er Other (See Comments)    High blood pressure  . Statins Other (See Comments)    Myalgias:  Previously on Crestor,Vytorin,Simvastatin,Lipitor & Pravastatin  . Sulfa  Antibiotics Swelling    CODE STATUS:     Code Status Orders        Start     Ordered   11/22/15 1946  Do not attempt resuscitation (DNR)   Continuous    Question Answer Comment  In the event of cardiac or respiratory ARREST Do not call a "code blue"   In the event of cardiac or respiratory ARREST Do not perform Intubation, CPR, defibrillation or ACLS   In the event of cardiac or respiratory ARREST Use medication by any route, position, wound care, and other measures to relive pain and suffering. May use oxygen, suction and manual treatment of airway obstruction as needed for comfort.      11/22/15 1945    Code Status History    Date Active Date Inactive Code Status Order ID Comments User Context   10/18/2015 11:01 AM 10/18/2015  5:37 PM Full Code [id number]  Algernon Huxley, MD Inpatient   10/16/2015 12:35 PM 10/18/2015 11:01 AM Full Code [id number]  Roxana Hires, MD Inpatient   07/29/2015  6:18 AM 07/31/2015  6:44 PM Full Code [id number]  Nelva Bush, MD Inpatient   12/26/2014 10:34 PM 12/29/2014  6:32 PM Full Code [id number]  Phillips Grout, MD Inpatient   05/06/2012 12:12 AM 05/11/2012  8:32 PM Full Code [id number]  Reeves Dam, RN Inpatient      TOTAL critical CARE TIME TAKING CARE OF  THIS PATIENT: 50 minutes.    Discussed this patient's son extensively Rhonda Vangieson M.D on 11/26/2015 at 1:35 PM  Between 7am to 6pm - Pager - [phone]  After 6pm go to [email or web address] - password [email or web address] [facility]  Office  [phone]  CC: Primary care physician; Pcp Not In System

## 2015-11-26 NOTE — Plan of Care (Signed)
Problem: Pain Managment: Goal: General experience of comfort will improve Outcome: Progressing Prn x1 for discomfort at  Left  Back from thoracentisis 1000 mls drawn off

## 2015-11-27 ENCOUNTER — Ambulatory Visit: Payer: Self-pay | Admitting: *Deleted

## 2015-11-27 ENCOUNTER — Inpatient Hospital Stay: Payer: Medicare Other

## 2015-11-27 LAB — CBC WITH DIFFERENTIAL/PLATELET
Basophils Absolute: 0.1 10*3/uL (ref 0–0.1)
Basophils Relative: 1 %
EOS ABS: 0 10*3/uL (ref 0–0.7)
Eosinophils Relative: 1 %
HEMATOCRIT: 34.4 % — AB (ref 40.0–52.0)
HEMOGLOBIN: 11.2 g/dL — AB (ref 13.0–18.0)
LYMPHS ABS: 0.6 10*3/uL — AB (ref 1.0–3.6)
LYMPHS PCT: 7 %
MCH: 26.6 pg (ref 26.0–34.0)
MCHC: 32.6 g/dL (ref 32.0–36.0)
MCV: 81.7 fL (ref 80.0–100.0)
MONOS PCT: 5 %
Monocytes Absolute: 0.4 10*3/uL (ref 0.2–1.0)
NEUTROS ABS: 7.5 10*3/uL — AB (ref 1.4–6.5)
NEUTROS PCT: 86 %
Platelets: 227 10*3/uL (ref 150–440)
RBC: 4.21 MIL/uL — AB (ref 4.40–5.90)
RDW: 17.2 % — ABNORMAL HIGH (ref 11.5–14.5)
WBC: 8.6 10*3/uL (ref 3.8–10.6)

## 2015-11-27 LAB — COMPREHENSIVE METABOLIC PANEL
ALK PHOS: 92 U/L (ref 38–126)
ALT: 25 U/L (ref 17–63)
ANION GAP: 8 (ref 5–15)
AST: 23 U/L (ref 15–41)
Albumin: 2.2 g/dL — ABNORMAL LOW (ref 3.5–5.0)
BILIRUBIN TOTAL: 0.5 mg/dL (ref 0.3–1.2)
BUN: 41 mg/dL — ABNORMAL HIGH (ref 6–20)
CALCIUM: 7.2 mg/dL — AB (ref 8.9–10.3)
CO2: 22 mmol/L (ref 22–32)
CREATININE: 1.99 mg/dL — AB (ref 0.61–1.24)
Chloride: 110 mmol/L (ref 101–111)
GFR calc non Af Amer: 30 mL/min — ABNORMAL LOW (ref >60)
GFR, EST AFRICAN AMERICAN: 35 mL/min — AB (ref >60)
GLUCOSE: 108 mg/dL — AB (ref 65–99)
Potassium: 3.9 mmol/L (ref 3.5–5.1)
SODIUM: 140 mmol/L (ref 135–145)
TOTAL PROTEIN: 5.6 g/dL — AB (ref 6.5–8.1)

## 2015-11-27 LAB — BLOOD GAS, ARTERIAL
ACID-BASE DEFICIT: 6.2 mmol/L — AB (ref 0.0–2.0)
ALLENS TEST (PASS/FAIL): POSITIVE — AB
BICARBONATE: 16.9 meq/L — AB (ref 21.0–28.0)
FIO2: 0.21
O2 SAT: 92.5 %
PATIENT TEMPERATURE: 37
pCO2 arterial: 26 mmHg — ABNORMAL LOW (ref 32.0–48.0)
pH, Arterial: 7.42 (ref 7.350–7.450)
pO2, Arterial: 64 mmHg — ABNORMAL LOW (ref 83.0–108.0)

## 2015-11-27 MED ORDER — WARFARIN SODIUM 7.5 MG PO TABS
7.5000 mg | ORAL_TABLET | Freq: Every day | ORAL | Status: AC
  Administered 2015-11-27: 7.5 mg via ORAL
  Filled 2015-11-27: qty 1

## 2015-11-27 MED ORDER — WARFARIN - PHYSICIAN DOSING INPATIENT
Freq: Every day | Status: DC
  Administered 2015-11-27: 19:00:00

## 2015-11-27 MED ORDER — MIRTAZAPINE 15 MG PO TABS
15.0000 mg | ORAL_TABLET | Freq: Every day | ORAL | Status: DC
  Administered 2015-11-27 – 2015-11-28 (×2): 15 mg via ORAL
  Filled 2015-11-27 (×2): qty 1

## 2015-11-27 NOTE — Progress Notes (Signed)
Nutrition Follow-up  DOCUMENTATION CODES:   Severe malnutrition in context of chronic illness  INTERVENTION:   Meals and Snacks: Cater to patient preferences; pt now on Dysphagia II, Nectar thick liquids, SLP following Coordination of Care: spoke with MD, Ether Griffins, aware of severe malnutrition recommendation and supplementation ordered per Nectar thick liquids at this time Medical Food Supplement Therapy: will discontinue Boost Breeze as it is not nectar thick compliant. Will send Mighty Shakes on meal trays TID for added nutrition (each shake provides 300kcals and 9g protein) for pt to try as well as [employer].  NUTRITION DIAGNOSIS:   Malnutrition related to chronic illness as evidenced by energy intake < or equal to 75% for > or equal to 1 month, percent weight loss, moderate depletions of muscle mass.  GOAL:   Patient will meet greater than or equal to 90% of their needs  MONITOR:    (Energy Intake, Electrolyte and renal Profile, Pulmonary Profile, Anthropometrics, Digestive System)  REASON FOR ASSESSMENT:   Consult Assessment of nutrition requirement/status  ASSESSMENT:   Pt admitted with acute renal failure and SOB. Pt with h/o stage IV lung cancer with known mets.  Pt s/p MBSS this am, per SLP pt diet order downgraded to Nectar thick liquids, with dysphagia 2. SLP in with pt and family on rounds today.  Diet Order:  DIET DYS 2 Room service appropriate?: Yes with Assist; Fluid consistency:: Nectar Thick    Current Nutrition: Pt at MBSS this am. Recorded po intake of 0% yesterday. RD notes Ensure supplement from home in window seal, however unopened.   Gastrointestinal Profile: Last BM: 11/26/2015   Scheduled Medications:  . aspirin EC  81 mg Oral Daily  . citalopram  20 mg Oral Daily  . guaiFENesin  600 mg Oral BID  . LORazepam  0.25 mg Oral BID  . mirtazapine  15 mg Oral QHS  . tamsulosin  0.4 mg Oral QPC supper    Electrolyte/Renal Profile and Glucose  Profile:   Recent Labs Lab 11/22/15 1638  11/25/15 1225 11/25/15 1835 11/26/15 0638 11/27/15 0609  NA 133*  < > 135 136  --  140  K 4.7  < > 5.2* 4.8  --  3.9  CL 104  < > 110 108  --  110  CO2 19*  < > 16* 19*  --  22  BUN 41*  < > 37* 36*  --  41*  CREATININE 2.50*  < > 2.01* 1.91* 2.18* 1.99*  CALCIUM 7.7*  < > 7.7* 7.7*  --  7.2*  MG 2.0  --   --   --   --   --   GLUCOSE 104*  < > 128* 124*  --  108*  < > = values in this interval not displayed. Protein Profile:  Recent Labs Lab 11/22/15 1638 11/25/15 1835 11/27/15 0609  ALBUMIN 2.5* 2.2* 2.2*     Weight Trend since Admission: [facility] Weights   11/22/15 2140 11/26/15 2300 11/27/15 0454  Weight: 182 lb 12.8 oz (82.918 kg) 176 lb 12.8 oz (80.196 kg) 179 lb 14.4 oz (81.602 kg)     BMI:  Body mass index is 29.05 kg/(m^2).  Estimated Nutritional Needs:   Kcal:  BEE: 1472kcals, TEE: (IF 1.1-1.3)(AF 1.2) 1944-2297kcals  Protein:  90-107g protein (1.1-1.3g/kg)  Fluid:  2050-249m of fluid (25-370mkg)  EDUCATION NEEDS:   No education needs identified at this time  HIKlamath FallsRD, LDN Pager ([phone]/On-Call Pager (  [phone]  

## 2015-11-27 NOTE — Evaluation (Signed)
Physical Therapy Re-evaluation Patient Details Name: [name] MRN: [id number] DOB: [birth year] Today's Date: 11/27/2015   History of Present Illness  Pt here with chest pain, cough and has been dealing with treatments for stage for lung cancer. He has some vocal cord paralysis and has trouble fully articulating.  Clinical Impression  Re-consulted on pt due to progressive weakness and decline in mobility. Pt demonstrates decreased endurance with ambulation with HR peaking around 128-130 bpm with slow gait speed. Pt is generally stable for ambulation without assistive device but fatigues during ambulation. General deconditioning evident in bilateral UE/LE with transfers and manual muscle testing. Higher level balance deficits in Rhomberg and single leg stance. Pt will benefit from [facility] PT to improve overall strength and cardiopulmonary endurance due to progressive weakness over the last few months. Pt will benefit from skilled PT services to address deficits in strength, balance, and mobility in order to return to full function at home.     Follow Up Recommendations Home health PT;Supervision - Intermittent    Equipment Recommendations  None recommended by PT (Pt should use single point cane for ambulation outside)    Recommendations for Other Services       Precautions / Restrictions Precautions Precautions: Fall Restrictions Weight Bearing Restrictions: No      Mobility  Bed Mobility               General bed mobility comments: Received upright in recliner and left upright sitting at EOB. Not assessed  Transfers Overall transfer level: Needs assistance Equipment used: None Transfers: Sit to/from Stand Sit to Stand: Supervision         General transfer comment: Pt demonstrates increased time required to come to standing due to LE weakness. Overall stability is fair but pt with mild instability noted during transfer  Ambulation/Gait Ambulation/Gait assistance: Min  guard Ambulation Distance (Feet): 250 Feet Assistive device: None Gait Pattern/deviations: Decreased step length - right;Decreased step length - left Gait velocity: Decreased Gait velocity interpretation: <1.8 ft/sec, indicative of risk for recurrent falls General Gait Details: Pt ambulates with decreased gait speed and step length. Decreased toe to floor clearance during swing. Shuffling gait with forward head posture. Vitals monitored and SaO2 remains >90% on room air however HR increases to 128-130 bpm. Pt visibly fatigued with increased respiration rate and need to take a seated rest break upon return to room. Fatigue and vitals monitored throughout ambulation. Communication difficult due to partial vocal cord paralysis  Stairs            Wheelchair Mobility    Modified Rankin (Stroke Patients Only)       Balance Overall balance assessment: Needs assistance Sitting-balance support: No upper extremity supported Sitting balance-Leahy Scale: Good     Standing balance support: No upper extremity supported Standing balance-Leahy Scale: Fair Standing balance comment: Pt with positive Rhomberg 5-8 seconds. Single leg balance 1.5 seconds bilateral. Overall stable during ambulation with wide BOS                             Pertinent Vitals/Pain Pain Assessment: No/denies pain    Home Living Family/patient expects to be discharged to:: Private residence Living Arrangements: Spouse/significant other Available Help at Discharge: Family Type of Home: House Home Access: Level entry     Home Layout: Multi-level Home Equipment: Bedside commode;Wheelchair - Scientist, product/process development - 2 wheels;Walker - 4 wheels      Prior Function Level of Independence:  Independent         Comments: Pt reports he has not been out of the house much since starting cancer treatements. Progressive decline in strength and function over the last 3 months     Hand Dominance    Dominant Hand: Right    Extremity/Trunk Assessment   Upper Extremity Assessment: Generalized weakness (Grossly 4-/5 throughout)           Lower Extremity Assessment: Generalized weakness (Grossly 4-/5 throughout, no focal weakness)         Communication   Communication: Other (comment) (Slurred, per medical record partial vocal cord paralysis)  Cognition Arousal/Alertness: Awake/alert Behavior During Therapy: WFL for tasks assessed/performed Overall Cognitive Status: Within Functional Limits for tasks assessed                      General Comments      Exercises        Assessment/Plan    PT Assessment Patient needs continued PT services  PT Diagnosis Generalized weakness;Difficulty walking;Abnormality of gait   PT Problem List Decreased strength;Decreased activity tolerance;Decreased balance;Decreased mobility;Decreased safety awareness;Cardiopulmonary status limiting activity  PT Treatment Interventions DME instruction;Gait training;Stair training;Therapeutic activities;Therapeutic exercise;Neuromuscular re-education;Balance training;Patient/family education   PT Goals (Current goals can be found in the Care Plan section) Acute Rehab PT Goals Patient Stated Goal: Return to working outside PT Goal Formulation: With patient/family Time For Goal Achievement: 12/11/15 Potential to Achieve Goals: Fair    Frequency Min 2X/week   Barriers to discharge        Co-evaluation               End of Session Equipment Utilized During Treatment: Gait belt Activity Tolerance: Patient tolerated treatment well Patient left: with call bell/phone within reach;with family/visitor present (sitting upright at EOB) Nurse Communication: Mobility status         Time: 0814-4818 PT Time Calculation (min) (ACUTE ONLY): 22 min   Charges:   PT Evaluation $PT Re-evaluation: 1 Procedure PT Treatments $Gait Training: 8-22 mins   PT G Codes:       Lyndel Safe Stephanie Mcglone  PT, DPT   Susannah Carbin 11/27/2015, 2:53 PM

## 2015-11-27 NOTE — Care Management Important Message (Signed)
Important Message  Patient Details  Name: [name] MRN: [id number] Date of Birth: [birth year]   Medicare Important Message Given:  Yes    Beau Fanny, RN 11/27/2015, 9:58 AM

## 2015-11-27 NOTE — Plan of Care (Signed)
Problem: Education: Goal: Knowledge of [facility] Education information/materials will improve Outcome: Progressing Modified swallow study this am. Placed on nectar thick liqs and dys 2 diet. Pt does not like this diet change.  Prn x 1 for c/o nausea. Renal ultrasound this pm.

## 2015-11-27 NOTE — Progress Notes (Signed)
[facility] [place] NAME: [name]    MR#:  [id number]  DATE OF BIRTH:  Oct 26, 1934  SUBJECTIVE:  CHIEF COMPLAINT:   Chief Complaint  Patient presents with  . Hypotension  . Dizziness   patient is 81 year old male with past medical history significant for history of essential hypertension, coronary artery disease, atrial fibrillation, on Coumadin therapy, stage IV lung cancer with metastasis who presents to the hospital with very poor oral intake, dizziness and generalized weakness. He was seen by Dr. Oliva Bustard and was found to have hypotension with systolic blood pressure in 80s. He was sent to emergency room and was admitted. Labs revealed acute renal failure, creatinine of 2.5, baseline being at 1.2-1.6. Patient was given IV fluids and his creatinine improved. He is noted to be more short of breath and repeated chest x-ray revealed worsening left pleural effusion . Patient's IV fluids were stopped and Lasix IV was administered with some improvement of his respiratory status. Left thoracentesis was performed on February 6 .  Remains on oxygen therapy , is much more comfortable and less dyspneic after thoracentesis. ABGs revealed respiratory alkalosis , metabolic acidosis, compensated. Labs showed renal failure, which remains stable. Patient is off Lasix at present. Patient was seen by oncologist , recommended to discontinue Lasix. Left pleural effusion is considered to be malignant at present, although pathology results are pending Patient feels better today, although still a little short of breath, better oral intake, systolic blood pressure is stable, afebrile  Review of Systems  Constitutional: Negative for fever, chills and weight loss.  HENT: Negative for congestion.   Eyes: Negative for blurred vision and double vision.  Respiratory: Positive for cough. Negative for sputum production, shortness of breath and wheezing.   Cardiovascular:  Negative for chest pain, palpitations, orthopnea, leg swelling and PND.  Gastrointestinal: Positive for constipation. Negative for nausea, vomiting, abdominal pain, diarrhea and blood in stool.  Genitourinary: Negative for dysuria, urgency, frequency and hematuria.  Musculoskeletal: Negative for falls.  Neurological: Negative for dizziness, tremors, focal weakness and headaches.  Endo/Heme/Allergies: Does not bruise/bleed easily.  Psychiatric/Behavioral: Negative for depression. The patient does not have insomnia.     VITAL SIGNS: Blood pressure 104/66, pulse 90, temperature 97.4 F (36.3 C), temperature source Oral, resp. rate 18, height '5\' 6"'$  (1.676 m), weight 81.602 kg (179 lb 14.4 oz), SpO2 100 %.  PHYSICAL EXAMINATION:   GENERAL:  81 y.o.-year-old patient lying in the bed in moderate respiratory distress. Dysarthric, her main somewhat dyspneic, especially on exertion  EYES: Pupils equal, round, reactive to light and accommodation. No scleral icterus. Extraocular muscles intact.  HEENT: Head atraumatic, normocephalic. Oropharynx and nasopharynx clear.  NECK:  Supple, no jugular venous distention. No thyroid enlargement, no tenderness.  LUNGS:Some diminished  breath sounds  on the left base, dullness to percussion on the left,  scattered wheezing, basilar rales,rhonchi or crepitation noted intermittently, scattered. Using accessory muscles of respiration, especially with speech  CARDIOVASCULAR: S1, S2 normal. No murmurs, rubs, or gallops.  ABDOMEN: Soft, nontender, nondistended. Bowel sounds present. No organomegaly or mass.  EXTREMITIES: No pedal edema, cyanosis, or clubbing.  NEUROLOGIC: Cranial nerves II through XII are intact. Muscle strength 5/5 in all extremities. Sensation intact. Gait not checked.  PSYCHIATRIC: The patient is alert and oriented x 3.  SKIN: No obvious rash, lesion, or ulcer.   ORDERS/RESULTS REVIEWED:   CBC  Recent Labs Lab 11/22/15 1410 11/22/15 1638  11/23/15 0529 11/26/15 6237  11/27/15 0609  WBC 9.1 7.4 6.4 8.6 8.6  HGB 12.7* 11.3* 11.4* 11.8* 11.2*  HCT 38.3* 34.6* 34.8* 36.0* 34.4*  PLT 196 178 168 216 227  MCV 80.6 79.7* 82.0 81.9 81.7  MCH 26.7 26.2 26.9 26.8 26.6  MCHC 33.1 32.8 32.8 32.7 32.6  RDW 17.1* 16.5* 17.1* 17.1* 17.2*  LYMPHSABS 0.9* 0.8*  --   --  0.6*  MONOABS 0.5 0.5  --   --  0.4  EOSABS 0.0 0.0  --   --  0.0  BASOSABS 0.1 0.1  --   --  0.1   ------------------------------------------------------------------------------------------------------------------  Chemistries   Recent Labs Lab 11/22/15 1638 11/23/15 0529 11/25/15 0712 11/25/15 1225 11/25/15 1835 11/26/15 0638 11/27/15 0609  NA 133* 133* 138 135 136  --  140  K 4.7 4.6 6.5* 5.2* 4.8  --  3.9  CL 104 108 110 110 108  --  110  CO2 19* 17* 19* 16* 19*  --  22  GLUCOSE 104* 111* 120* 128* 124*  --  108*  BUN 41* 39* 35* 37* 36*  --  41*  CREATININE 2.50* 2.21* 1.99* 2.01* 1.91* 2.18* 1.99*  CALCIUM 7.7* 7.1* 7.9* 7.7* 7.7*  --  7.2*  MG 2.0  --   --   --   --   --   --   AST 26  --   --   --  24  --  23  ALT 29  --   --   --  21  --  25  ALKPHOS 103  --   --   --  88  --  92  BILITOT 0.7  --   --   --  0.8  --  0.5   ------------------------------------------------------------------------------------------------------------------ estimated creatinine clearance is 30.2 mL/min (by C-G formula based on Cr of 1.99). ------------------------------------------------------------------------------------------------------------------ No results for input(s): TSH, T4TOTAL, T3FREE, THYROIDAB in the last 72 hours.  Invalid input(s): FREET3  Cardiac Enzymes  Recent Labs Lab 11/22/15 1638  TROPONINI <0.03   ------------------------------------------------------------------------------------------------------------------ Invalid input(s):  POCBNP ---------------------------------------------------------------------------------------------------------------  RADIOLOGY: X-ray Chest Pa Or Ap  11/26/2015  CLINICAL DATA:  Status post left-sided thoracentesis. EXAM: CHEST 1 VIEW COMPARISON:  November 26, 2015. FINDINGS: Stable cardiomegaly. Status post coronary artery bypass graft. No change in position of right internal jugular Port-A-Cath. Left pleural effusion noted on prior exam is significantly smaller status post thoracentesis. No pneumothorax is noted. Minimal right pleural effusion is noted. Mild left basilar atelectasis is noted as well. IMPRESSION: No pneumothorax status post left-sided thoracentesis. Left pleural effusion is significantly smaller, with mild residual left basilar atelectasis. Electronically Signed   By: Marijo Conception, M.D.   On: 11/26/2015 15:01   US Renal  11/27/2015  CLINICAL DATA:  Patient with acute renal failure. EXAM: RENAL / URINARY TRACT ULTRASOUND COMPLETE COMPARISON:  CT abdomen pelvis 12/26/2014 FINDINGS: Right Kidney: Length: 10.1 cm. Normal renal cortical thickness and echogenicity. No hydronephrosis. There is a 2.0 x 2.3 x 2.4 cm cyst within the inferior pole. There is an indeterminate sub cm hypoechoic lesion within the superior pole. Left Kidney: Length: 13.0 cm. Normal renal cortical thickness and echogenicity. Mild hydronephrosis. Bladder: The prostate is enlarged. IMPRESSION: Mild left-sided hydronephrosis. Electronically Signed   By: Lovey Newcomer M.D.   On: 11/27/2015 15:48   Dg Chest Port 1 View  11/26/2015  CLINICAL DATA:  Dyspnea, recent diagnosis of left upper lobe lung malignancy, acute renal failure, history of CABG, former smoker. EXAM:  PORTABLE CHEST 1 VIEW COMPARISON:  PA and lateral chest x-ray of November 25, 2015 FINDINGS: Further opacification of the left hemi thorax has occurred likely due to pleural fluid new layering posteriorly. There is no significant mediastinal shift toward the right.  The right lung is clear. Only a small portion of the left upper lobe remains aerated. The cardiac silhouette appears enlarged but its margins on the left are indistinct. There is mild central pulmonary vascular prominence. The Port-A-Cath appliance tip projects over the proximal SVC. The bony thorax exhibits no acute abnormality. IMPRESSION: Increased left pleural effusion may be in part due to the portable technique though the film is labeled "erect". Yesterday's study was AP "sitting" in position. Worsening of probable left upper lobe atelectasis. If the patient's clinical status has significantly deteriorated, ultrasound of the thorax may be useful to judge whether there is significant pleural fluid volume warranting thoracentesis. Electronically Signed   By: David  Martinique M.D.   On: 11/26/2015 08:02   US Thoracentesis Asp Pleural Space W/img Guide  11/26/2015  INDICATION: Left pleural effusion. EXAM: ULTRASOUND GUIDED left THORACENTESIS MEDICATIONS: None. COMPLICATIONS: None immediate. PROCEDURE: An ultrasound guided thoracentesis was thoroughly discussed with the patient and questions answered. The benefits, risks, alternatives and complications were also discussed. The patient understands and wishes to proceed with the procedure. Written consent was obtained. Ultrasound was performed to localize and mark an adequate pocket of fluid in the left chest. The area was then prepped and draped in the normal sterile fashion. 1% Lidocaine was used for local anesthesia. Under ultrasound guidance a Safe-T-Centesis catheter was introduced. Thoracentesis was performed. The catheter was removed and a dressing applied. FINDINGS: A total of approximately 1000 mL of serous fluid was removed. Samples were sent to the laboratory as requested by the clinical team. IMPRESSION: Successful ultrasound guided left thoracentesis yielding 1 L of pleural fluid. Electronically Signed   By: Marijo Conception, M.D.   On: 11/26/2015 15:04     EKG:  Orders placed or performed during the hospital encounter of 11/22/15  . ED EKG  . ED EKG    ASSESSMENT AND PLAN:  Active Problems:   ARF (acute renal failure) (HCC)   Hypotension   Supratherapeutic INR #1 Acute on chronic renal failure, which was initially felt to be due to dehydration,  poor by mouth intake, now off IV fluids due to worsening left pleural effusion. Following creatinine closely. Worse with diuresis, and Lasix was stopped , follow creatinine in the morning  #2 failure to thrive, adult , etiologyis likely metastatic lung cancer ,  also patient reported some element of constipation, added Senokot , had bowel movement , oral intake improved, appreciate dietary input.   #3. Dysphagia,  the patient underwent a modified barium swallowing study today , recommended  dysphagia  1 diet  With nectar   thin liquids,  appreciate speech therapist involvement, discussed with her today #4. Hyponatremia, urine and blood osmolarities revealed inappropriate antidiuretic hormone release, SIADH, resolved With IV fluids, now patient is off IV fluids and  Stable sodium level today  #5. Coagulopathy, acquired, now off Coumadin, INR is 1.35  Yesterday after vitamin K administration, resumed  Coumadin after thoracentesis, following ProTime in the morning  #6. Acute diastolic CHF, patient had echocardiogram done in October 2016,  off Lasix , . Continue it as needed only #7. Left pleural effusion, concerning for malignant,   status post left thoracentesis February 6 was 1.0 L of fluid removed and, no  growth  #8. Hyperkalemia, patient was given Lasix was no significant improvement in the potassium levels, given Kayexalate orally, potassium has improved, stable  #9. Acute respiratory failure  with hypoxia due to pleural effusion, acute diastolic CHF, status post thoracentesis,  continue intermittent Lasix and oxygen therapy,  wean as tolerated   #10. Hypotension, due to large left pleural effusion  and thoracic organ shift,  some better after thoracentesis and stopping Lasix, stable   Management plans discussed with the patient, family and they are in agreement.   DRUG ALLERGIES:  Allergies  Allergen Reactions  . Clonidine Derivatives Other (See Comments)    Pt was not aware of a reaction to this medication  . Diltiazem Hcl Er Other (See Comments)    High blood pressure  . Statins Other (See Comments)    Myalgias:  Previously on Crestor,Vytorin,Simvastatin,Lipitor & Pravastatin  . Sulfa Antibiotics Swelling    CODE STATUS:     Code Status Orders        Start     Ordered   11/22/15 1946  Do not attempt resuscitation (DNR)   Continuous    Question Answer Comment  In the event of cardiac or respiratory ARREST Do not call a "code blue"   In the event of cardiac or respiratory ARREST Do not perform Intubation, CPR, defibrillation or ACLS   In the event of cardiac or respiratory ARREST Use medication by any route, position, wound care, and other measures to relive pain and suffering. May use oxygen, suction and manual treatment of airway obstruction as needed for comfort.      11/22/15 1945    Code Status History    Date Active Date Inactive Code Status Order ID Comments User Context   10/18/2015 11:01 AM 10/18/2015  5:37 PM Full Code [id number]  Algernon Huxley, MD Inpatient   10/16/2015 12:35 PM 10/18/2015 11:01 AM Full Code [id number]  Roxana Hires, MD Inpatient   07/29/2015  6:18 AM 07/31/2015  6:44 PM Full Code [id number]  Nelva Bush, MD Inpatient   12/26/2014 10:34 PM 12/29/2014  6:32 PM Full Code [id number]  Phillips Grout, MD Inpatient   05/06/2012 12:12 AM 05/11/2012  8:32 PM Full Code [id number]  Reeves Dam, RN Inpatient      TOTAL critical CARE TIME TAKING CARE OF THIS PATIENT: 40 minutes.    Discussed this patient's  Harrison Mons M.D on 11/27/2015 at 4:26 PM  Between 7am to 6pm - Pager - [phone]  After 6pm go to [email or web address] -  password [email or web address] [facility]  Office  [phone]  CC: Primary care physician; Pcp Not In System

## 2015-11-27 NOTE — Consult Note (Signed)
Pulmonary Critical Care  Initial Consult Note   [name]:[id number] DOB: 06-28-1935 DOA: 11/22/2015  Referring physician: Theodoro Grist, MD PCP: Pcp Not In System   Chief Complaint: Shortness of Breath  HPI: [name] is a 81 y.o. male with prior history of HTN stage IV lung cancer.Patient presented to the hospital with decreased oral intake.  Patient has been having generalized weakness and dizziness.  He was also having increased shortness of breath. There is no chest pain or palpitations noted.  No fevers noted.  On initial presentation a chest x-ray was done which showed left-sided pleural effusion.  Patient underwent a thoracentesis with removal of fluid the results of which are in the chart.  There results do not seem to indicate an infectious process.   Review of Systems:  Constitutional:  + weight loss, no night sweats, Fevers, chills, fatigue.  HEENT:  No headaches, nasal congestion, post nasal drip,  Cardio-vascular:  No chest pain, +Orthopnea, +dizziness, palpitations  GI:  No heartburn, indigestion, abdominal pain, nausea, vomiting, diarrhea  Resp:  +shortness of breath +productive cough, No coughing up of blood Skin:  no rash or lesions.  Musculoskeletal:  No joint pain or swelling.   Remainder ROS performed and is unremarkable other than noted in HPI  Past Medical History  Diagnosis Date  . Hypertension   . Coronary artery disease   . Atrial fibrillation ([place]) 07/2015  . Primary cancer of left upper lobe of lung ([place]) 10/06/2015   Past Surgical History  Procedure Laterality Date  . Coronary artery bypass graft  12/10/2005    LIMA to LAD,SVG to diagonal,SVG to ramus intermediate,SVG to distal RCA.  . Tonsillectomy    . Tee without cardioversion  05/10/2012    Procedure: TRANSESOPHAGEAL ECHOCARDIOGRAM (TEE);  Surgeon: [name], MD;  Location: [facility] ENDOSCOPY;  Service: Cardiovascular;  Laterality: N/A;  . Cardioversion  05/10/2012   Procedure: CARDIOVERSION;  Surgeon: [name], MD;  Location: [facility] ENDOSCOPY;  Service: Cardiovascular;  Laterality: N/A;  . Cardiac catheterization  10/28/2005    significant 3 vessel CAD  . Nm myocar perf wall motion  09/16/2005    evidence of diaphragmatic attenuation & subtle anterolateral ischemia.  . Cardioversion N/A 07/31/2015    Procedure: CARDIOVERSION;  Surgeon: Josue Hector, MD;  Location: [facility] ENDOSCOPY;  Service: Cardiovascular;  Laterality: N/A;  . Peripheral vascular catheterization N/A 10/18/2015    Procedure: Glori Luis Cath Insertion;  Surgeon: Algernon Huxley, MD;  Location: [place] CV LAB;  Service: Cardiovascular;  Laterality: N/A;   Social History:  reports that he quit smoking about 10 years ago. His smoking use included Cigarettes. He has a 40 pack-year smoking history. He has never used smokeless tobacco. He reports that he does not drink alcohol or use illicit drugs.  Allergies  Allergen Reactions  . Clonidine Derivatives Other (See Comments)    Pt was not aware of a reaction to this medication  . Diltiazem Hcl Er Other (See Comments)    High blood pressure  . Statins Other (See Comments)    Myalgias:  Previously on Crestor,Vytorin,Simvastatin,Lipitor & Pravastatin  . Sulfa Antibiotics Swelling    Family History  Problem Relation Age of Onset  . Stroke Mother 43  . Stroke Father 35  . Heart attack Father 67  . Heart attack Brother 55  . Cancer Sister 70    Prior to Admission medications   Medication Sig Start Date End Date Taking? Authorizing Provider  aspirin  EC 81 MG tablet Take 81 mg by mouth daily.   Yes Historical Provider, MD  guaiFENesin (MUCINEX) 600 MG 12 hr tablet Take 1 tablet (600 mg total) by mouth 2 (two) times daily. 11/02/15 11/30/15 Yes Forest Gleason, MD  HYDROcodone-acetaminophen (NORCO/VICODIN) 5-325 MG tablet Take 1 tablet by mouth every 6 (six) hours as needed for moderate pain. 11/02/15  Yes Forest Gleason, MD  ipratropium-albuterol  (DUONEB) 0.5-2.5 (3) MG/3ML SOLN Take 3 mLs by nebulization every 4 (four) hours as needed. Patient taking differently: Take 3 mLs by nebulization every 4 (four) hours as needed (for shortness of breath).  11/15/15  Yes Forest Gleason, MD  lidocaine-prilocaine (EMLA) cream Apply 1 application topically as needed. Patient taking differently: Apply 1 application topically as needed (before treatment).  10/18/15  Yes Evlyn Kanner, NP  lisinopril (PRINIVIL,ZESTRIL) 10 MG tablet Take 1 tablet (10 mg total) by mouth daily. Patient taking differently: Take 10 mg by mouth daily as needed (for high blood pressure).  09/24/15  Yes [name], MD  LORazepam (ATIVAN) 0.5 MG tablet Take 0.5 tablets (0.25 mg total) by mouth 2 (two) times daily. 10/18/15  Yes Evlyn Kanner, NP  metoprolol succinate (TOPROL-XL) 50 MG 24 hr tablet Take 1 tablet (50 mg total) by mouth daily. 08/27/15  Yes [name], MD  mirtazapine (REMERON) 15 MG tablet Take 1 tablet (15 mg total) by mouth at bedtime. Patient taking differently: Take 15 mg by mouth at bedtime as needed (for sleep).  11/15/15  Yes Forest Gleason, MD  ondansetron (ZOFRAN) 4 MG tablet Take 1 tablet (4 mg total) by mouth every 8 (eight) hours as needed for nausea or vomiting. 10/19/15  Yes Forest Gleason, MD  oxyCODONE-acetaminophen (PERCOCET/ROXICET) 5-325 MG tablet Take 1-2 tablets by mouth every 4 (four) hours as needed for moderate pain. 10/18/15  Yes Evlyn Kanner, NP  Tamsulosin HCl (FLOMAX) 0.4 MG CAPS Take 0.4 mg by mouth daily after supper.   Yes Historical Provider, MD  warfarin (COUMADIN) 5 MG tablet 0.5 tablet (2.'5mg'$ ) by mouth 5 days a week. Starting on Monday 10/3015. Patient taking differently: Take 2.5 mg by mouth every other day. In the evening 11/15/15  Yes Forest Gleason, MD  fentaNYL (DURAGESIC - DOSED MCG/HR) 12 MCG/HR Place 1 patch (12.5 mcg total) onto the skin every 3 (three) days. Patient not taking: Reported on 11/22/2015 10/08/15   Forest Gleason, MD  omeprazole (PRILOSEC) 20 MG capsule Take 1 capsule (20 mg total) by mouth 2 (two) times daily before a meal. Patient not taking: Reported on 11/22/2015 10/08/15   Forest Gleason, MD  predniSONE (DELTASONE) 20 MG tablet Take 1 tablet (20 mg total) by mouth daily with breakfast. Patient not taking: Reported on 11/22/2015 10/02/15   Forest Gleason, MD   Physical Exam: Filed Vitals:   11/26/15 1511 11/26/15 2055 11/26/15 2300 11/27/15 0454  BP: 96/56 105/68  106/63  Pulse: 121 103  103  Temp: 97.5 F (36.4 C) 98 F (36.7 C)  97.2 F (36.2 C)  TempSrc: Oral Oral  Oral  Resp: '18 20  18  '$ Height:      Weight:   80.196 kg (176 lb 12.8 oz) 81.602 kg (179 lb 14.4 oz)  SpO2: 99% 93%  100%    Wt Readings from Last 3 Encounters:  11/27/15 81.602 kg (179 lb 14.4 oz)  11/22/15 80.06 kg (176 lb 8 oz)  11/15/15 80.854 kg (178 lb 4 oz)    General:  Appears calm  and comfortable Eyes: PERRL, normal lids, irises & conjunctiva ENT: grossly normal hearing, lips & tongue Neck: no LAD, masses or thyromegaly Cardiovascular: RRR, no m/r/g. No LE edema. Respiratory: no w/r/r. Normal respiratory effort. Abdomen: soft, nontender Skin: no rash or induration seen on limited exam Musculoskeletal: grossly normal tone BUE/BLE Psychiatric:  Flat affect Neurologic:  Moves all four extremities          Labs on Admission:  Basic Metabolic Panel:  Recent Labs Lab 11/22/15 1638 11/23/15 0529 11/25/15 0712 11/25/15 1225 11/25/15 1835 11/26/15 0638 11/27/15 0609  NA 133* 133* 138 135 136  --  140  K 4.7 4.6 6.5* 5.2* 4.8  --  3.9  CL 104 108 110 110 108  --  110  CO2 19* 17* 19* 16* 19*  --  22  GLUCOSE 104* 111* 120* 128* 124*  --  108*  BUN 41* 39* 35* 37* 36*  --  41*  CREATININE 2.50* 2.21* 1.99* 2.01* 1.91* 2.18* 1.99*  CALCIUM 7.7* 7.1* 7.9* 7.7* 7.7*  --  7.2*  MG 2.0  --   --   --   --   --   --    Liver Function Tests:  Recent Labs Lab 11/22/15 1638 11/25/15 1835 11/27/15 0609    AST '26 24 23  '$ ALT '29 21 25  '$ ALKPHOS 103 88 92  BILITOT 0.7 0.8 0.5  PROT 6.4* 6.0* 5.6*  ALBUMIN 2.5* 2.2* 2.2*   No results for input(s): LIPASE, AMYLASE in the last 168 hours. No results for input(s): AMMONIA in the last 168 hours. CBC:  Recent Labs Lab 11/22/15 1410 11/22/15 1638 11/23/15 0529 11/26/15 0638 11/27/15 0609  WBC 9.1 7.4 6.4 8.6 8.6  NEUTROABS 7.5* 6.1  --   --  7.5*  HGB 12.7* 11.3* 11.4* 11.8* 11.2*  HCT 38.3* 34.6* 34.8* 36.0* 34.4*  MCV 80.6 79.7* 82.0 81.9 81.7  PLT 196 178 168 216 227   Cardiac Enzymes:  Recent Labs Lab 11/22/15 1638  TROPONINI <0.03    BNP (last 3 results)  Recent Labs  07/29/15 0051  BNP 158.0*    ProBNP (last 3 results) No results for input(s): PROBNP in the last 8760 hours.  CBG:  Recent Labs Lab 11/22/15 2242  GLUCAP 84    Radiological Exams on Admission: X-ray Chest Pa Or Ap  11/26/2015  CLINICAL DATA:  Status post left-sided thoracentesis. EXAM: CHEST 1 VIEW COMPARISON:  November 26, 2015. FINDINGS: Stable cardiomegaly. Status post coronary artery bypass graft. No change in position of right internal jugular Port-A-Cath. Left pleural effusion noted on prior exam is significantly smaller status post thoracentesis. No pneumothorax is noted. Minimal right pleural effusion is noted. Mild left basilar atelectasis is noted as well. IMPRESSION: No pneumothorax status post left-sided thoracentesis. Left pleural effusion is significantly smaller, with mild residual left basilar atelectasis. Electronically Signed   By: Marijo Conception, M.D.   On: 11/26/2015 15:01   Dg Chest Port 1 View  11/26/2015  CLINICAL DATA:  Dyspnea, recent diagnosis of left upper lobe lung malignancy, acute renal failure, history of CABG, former smoker. EXAM: PORTABLE CHEST 1 VIEW COMPARISON:  PA and lateral chest x-ray of November 25, 2015 FINDINGS: Further opacification of the left hemi thorax has occurred likely due to pleural fluid new layering  posteriorly. There is no significant mediastinal shift toward the right. The right lung is clear. Only a small portion of the left upper lobe remains aerated. The cardiac silhouette appears enlarged  but its margins on the left are indistinct. There is mild central pulmonary vascular prominence. The Port-A-Cath appliance tip projects over the proximal SVC. The bony thorax exhibits no acute abnormality. IMPRESSION: Increased left pleural effusion may be in part due to the portable technique though the film is labeled "erect". Yesterday's study was AP "sitting" in position. Worsening of probable left upper lobe atelectasis. If the patient's clinical status has significantly deteriorated, ultrasound of the thorax may be useful to judge whether there is significant pleural fluid volume warranting thoracentesis. Electronically Signed   By: David  Martinique M.D.   On: 11/26/2015 08:02   US Thoracentesis Asp Pleural Space W/img Guide  11/26/2015  INDICATION: Left pleural effusion. EXAM: ULTRASOUND GUIDED left THORACENTESIS MEDICATIONS: None. COMPLICATIONS: None immediate. PROCEDURE: An ultrasound guided thoracentesis was thoroughly discussed with the patient and questions answered. The benefits, risks, alternatives and complications were also discussed. The patient understands and wishes to proceed with the procedure. Written consent was obtained. Ultrasound was performed to localize and mark an adequate pocket of fluid in the left chest. The area was then prepped and draped in the normal sterile fashion. 1% Lidocaine was used for local anesthesia. Under ultrasound guidance a Safe-T-Centesis catheter was introduced. Thoracentesis was performed. The catheter was removed and a dressing applied. FINDINGS: A total of approximately 1000 mL of serous fluid was removed. Samples were sent to the laboratory as requested by the clinical team. IMPRESSION: Successful ultrasound guided left thoracentesis yielding 1 L of pleural fluid.  Electronically Signed   By: Marijo Conception, M.D.   On: 11/26/2015 15:04     EKG: Independently reviewed.  Assessment/Plan Active Problems:   ARF (acute renal failure) (HCC)   Hypotension   Supratherapeutic INR   1. Pleural Effusion - the presence of the pleural effusion is obviously worrisome  Due to this stage IV lung cancer and this may signify advancement disease.  Patient has had a thoracentesis will need to see how quickly the fluid comes back.  If the fluid returns quickly then I would consider doing a PleurX catheter for palliation. 2.   Stage IV lung cancer - oncology is following along.  Patient's on Keytruda - patient is currently a DNR       I have personally obtained a history, examined the patient, evaluated laboratory and imaging results, formulated the assessment and plan and placed orders.  The Patient requires high complexity decision making for assessment and support.    Allyne Gee, MD [facility] Pulmonary Critical Care Medicine Sleep Medicine

## 2015-11-27 NOTE — Evaluation (Signed)
Objective Swallowing Evaluation: Type of Study: MBS-Modified Barium Swallow Study  Patient Details  Name: [name] MRN: [id number] Date of Birth: [birth year]  Today's Date: 11/27/2015 Time: SLP Start Time (ACUTE ONLY): 0730-SLP Stop Time (ACUTE ONLY): 0830 SLP Time Calculation (min) (ACUTE ONLY): 60 min  Past Medical History:  Past Medical History  Diagnosis Date  . Hypertension   . Coronary artery disease   . Atrial fibrillation ([place]) 07/2015  . Primary cancer of left upper lobe of lung ([place]) 10/06/2015   Past Surgical History:  Past Surgical History  Procedure Laterality Date  . Coronary artery bypass graft  12/10/2005    LIMA to LAD,SVG to diagonal,SVG to ramus intermediate,SVG to distal RCA.  . Tonsillectomy    . Tee without cardioversion  05/10/2012    Procedure: TRANSESOPHAGEAL ECHOCARDIOGRAM (TEE);  Surgeon: [name], MD;  Location: [place] ENDOSCOPY;  Service: Cardiovascular;  Laterality: N/A;  . Cardioversion  05/10/2012    Procedure: CARDIOVERSION;  Surgeon: [name], MD;  Location: [facility] ENDOSCOPY;  Service: Cardiovascular;  Laterality: N/A;  . Cardiac catheterization  10/28/2005    significant 3 vessel CAD  . Nm myocar perf wall motion  09/16/2005    evidence of diaphragmatic attenuation & subtle anterolateral ischemia.  . Cardioversion N/A 07/31/2015    Procedure: CARDIOVERSION;  Surgeon: Josue Hector, MD;  Location: [facility] ENDOSCOPY;  Service: Cardiovascular;  Laterality: N/A;  . Peripheral vascular catheterization N/A 10/18/2015    Procedure: Glori Luis Cath Insertion;  Surgeon: Algernon Huxley, MD;  Location: [place] CV LAB;  Service: Cardiovascular;  Laterality: N/A;   HPI: [name] is a 81 y.o. male with a known history of hypertension, coronary artery disease, atrial fibrillation on Coumadin, stage IV lung cancer with metastasis. The patient got chemotherapy at the end of last month.he has had poor oral intake, dizziness and generalized weakness for the  past the 2 weeks. He went to Dr. Metro Kung office and was found to have low blood pressure at 80s. He was sent to the ED for further evaluation. HE ALSO COMPLAINS OF SHORTNESS OF BREATH. BUT HE DENIES ANY FEVER OR CHILLS. his blood pressure in the ED was 66/45 and he was found to have acute renal failure with creatinine increased to more than 2.5. He was treated with normal saline bolus. The patient presented with initial discomfort in the chest. He then developed a dry hacking cough and hoarseness. He was seen by Dr. Pryor Ochoa of ENT and discovered to have left vocal cord paralysis. Chest CT in 08/2015 revealed a 3.2 x 2.5 cm left upper lobe lung mass. There was ipsilateral peribronchial and ipsilateral hilar lymphadenopathy.PET scan revealed multiple bone metastases. Biopsy of an abdominal wall nodule on 09/25/2015 revealed poorly differentiated carcinoma(CK 7 positive, TTF negative) clinically consistent with lung primary.Pt was seen by ST services for a BSE in which the diet consistency was modified. Pt continues to exhibit soft s/s of aspiration; family endorsed pt has exhibited similar at home(mild throat clearing, cough) w/ po's. Pt appeared weak and was not interested in much po's; concerned for N/V. Pt exhibits poor/reduced respiratory support for speech(baseline); suspect related to vocal cord paralysis and lung Ca.  Subjective: Pt was alert and cooperative. Pt had very poor vocal quality noted from left vocal cord paralysis. Congested cough.  Assessment / Plan / Recommendation  CHL IP CLINICAL IMPRESSIONS 11/27/2015  Therapy Diagnosis Mild oral phase dysphagia;Moderate pharyngeal phase dysphagia  Clinical Impression Pt appeared to present w/ moderate oropharyngeal  phase dysphagia; no apparent cervical esophageal dysphagia during this study. Pt exhibited moderate pharyngeal phase dysphagia c/b decreased laryngeal excursion and anterior movement w/ reduced epiglottic inversion; decreased  pharyngeal pressure(tongue base contact decreased) noted during the swallowing also. This resulted in moderate pharyngeal residue in both the valleculae and pyriform sinuses. Pt utilized multiple swallows in attempt to clear the residue but was only able to reduce it. Pt also exhibited a delayed pharyngeal swallow initiation resulting in aspiration of thin liquids(1/1); pt responded w/ a delayed, mild cough to the aspiration - decreased sensation. A more timely pharyngeal swallow intiation was noted w/ trials of Nectar consistency liquids via cup(5) w/ no laryngeal penetration or aspiration noted during trials. During the oral phase, pt demo. decreased bolus control w/ thin liquids resulting in premature spillage into the pharynx and piecemealing w/ all boluses. Using strategies of chin tuck and multiple swallows appeared to aid in pharyngeal clearing of residue as well as increased airway protection during trials of thin liquids(small, single sips) though pt required consistent verbal cues. Pt appears at increased risk for aspiration at this time and a dysphagia diet of Dys. 2 w/ Nectar liquids via cup w/ strict aspiration precautions and swallowing strategies is rec'd.   Impact on safety and function Moderate aspiration risk      CHL IP TREATMENT RECOMMENDATION 11/27/2015  Treatment Recommendations Therapy as outlined in treatment plan below     Prognosis 11/27/2015  Prognosis for Safe Diet Advancement Fair  Barriers to Reach Goals Motivation  Barriers/Prognosis Comment --    CHL IP DIET RECOMMENDATION 11/27/2015  SLP Diet Recommendations Ice chips PRN after oral care;Dysphagia 2 (Fine chop) solids;Nectar thick liquid  Liquid Administration via Cup;No straw  Medication Administration Whole meds with puree  Compensations Minimize environmental distractions;Slow rate;Small sips/bites;Follow solids with liquid;Multiple dry swallows after each bite/sip  Postural Changes Seated upright at 90 degrees       CHL IP OTHER RECOMMENDATIONS 11/27/2015  Recommended Consults (No Data)  Oral Care Recommendations Oral care BID;Staff/trained caregiver to provide oral care  Other Recommendations Order thickener from pharmacy;Prohibited food (jello, ice cream, thin soups);Remove water pitcher      CHL IP FOLLOW UP RECOMMENDATIONS 11/27/2015  Follow up Recommendations Skilled Nursing facility      [facility] IP FREQUENCY AND DURATION 11/27/2015  Speech Therapy Frequency (ACUTE ONLY) min 3x week  Treatment Duration 2 weeks           CHL IP ORAL PHASE 11/27/2015  Oral Phase Impaired  Oral - Pudding Teaspoon --  Oral - Pudding Cup --  Oral - Honey Teaspoon --  Oral - Honey Cup --  Oral - Nectar Teaspoon --  Oral - Nectar Cup --  Oral - Nectar Straw --  Oral - Thin Teaspoon --  Oral - Thin Cup --  Oral - Thin Straw --  Oral - Puree --  Oral - Mech Soft --  Oral - Regular --  Oral - Multi-Consistency --  Oral - Pill --  Oral Phase - Comment (No Data)    CHL IP PHARYNGEAL PHASE 11/27/2015  Pharyngeal Phase Impaired  Pharyngeal- Pudding Teaspoon --  Pharyngeal --  Pharyngeal- Pudding Cup --  Pharyngeal --  Pharyngeal- Honey Teaspoon --  Pharyngeal --  Pharyngeal- Honey Cup --  Pharyngeal --  Pharyngeal- Nectar Teaspoon --  Pharyngeal --  Pharyngeal- Nectar Cup --  Pharyngeal --  Pharyngeal- Nectar Straw --  Pharyngeal --  Pharyngeal- Thin Teaspoon --  Pharyngeal --  Pharyngeal-  Thin Cup --  Pharyngeal --  Pharyngeal- Thin Straw --  Pharyngeal --  Pharyngeal- Puree --  Pharyngeal --  Pharyngeal- Mechanical Soft --  Pharyngeal --  Pharyngeal- Regular --  Pharyngeal --  Pharyngeal- Multi-consistency --  Pharyngeal --  Pharyngeal- Pill --  Pharyngeal --  Pharyngeal Comment (No Data)     CHL IP CERVICAL ESOPHAGEAL PHASE 11/27/2015  Cervical Esophageal Phase WFL  Pudding Teaspoon --  Pudding Cup --  Honey Teaspoon --  Honey Cup --  Nectar Teaspoon --  Nectar Cup --  Nectar Straw --   Thin Teaspoon --  Thin Cup --  Thin Straw --  Puree --  Mechanical Soft --  Regular --  Multi-consistency --  Pill --  Cervical Esophageal Comment --      Orinda Kenner, MS, CCC-SLP  Kaya Pottenger 11/27/2015, 4:37 PM

## 2015-11-27 NOTE — Consult Note (Signed)
[facility] CONSULT NOTE    Date: 11/27/2015                  Patient Name:  [name]  MRN: [id number]  DOB: 04-11-1935  Age / Sex: 81 y.o., male         PCP: Pcp Not In System                 Service Requesting Consult: Dr. Ether Griffins                 Reason for Consult: Acute renal failure/CKD stage III            History of Present Illness: Patient is a 81 y.o. male with a PMHx of hypertension, coronary artery disease, atrial fibrillation, metastatic left upper lobe lung cancer, who was admitted to [facility] on 11/22/2015 for evaluation of dizziness, weakness and poor by mouth intake.  The patient is actively followed by Dr. Oliva Bustard for his underlying lung cancer.  He recently started chemotherapy and thereafter had poor by mouth intake, dizziness, and generalized weakness.  He has been on chemotherapy with [place].  We are asked to see him for evaluation and management of acute renal failure.  His baseline creatinine is 1.1.  Upon presentation his creatinine was 2.5.  It has now come down to 1.99.  He was receiving IV fluids however had to be given Lasix for possible volume overload.  He also had thoracentesis performed.   Medications: Outpatient medications: Prescriptions prior to admission  Medication Sig Dispense Refill Last Dose  . aspirin EC 81 MG tablet Take 81 mg by mouth daily.   11/22/2015 at am  . guaiFENesin (MUCINEX) 600 MG 12 hr tablet Take 1 tablet (600 mg total) by mouth 2 (two) times daily. 60 tablet 0 11/22/2015 at am  . HYDROcodone-acetaminophen (NORCO/VICODIN) 5-325 MG tablet Take 1 tablet by mouth every 6 (six) hours as needed for moderate pain. 60 tablet 0 PRN  . ipratropium-albuterol (DUONEB) 0.5-2.5 (3) MG/3ML SOLN Take 3 mLs by nebulization every 4 (four) hours as needed. (Patient taking differently: Take 3 mLs by nebulization every 4 (four) hours as needed (for shortness of breath). ) 360 mL 3 PRN  . lidocaine-prilocaine (EMLA) cream Apply 1  application topically as needed. (Patient taking differently: Apply 1 application topically as needed (before treatment). ) 30 g 2 PRN  . lisinopril (PRINIVIL,ZESTRIL) 10 MG tablet Take 1 tablet (10 mg total) by mouth daily. (Patient taking differently: Take 10 mg by mouth daily as needed (for high blood pressure). ) 90 tablet 3 PRN  . LORazepam (ATIVAN) 0.5 MG tablet Take 0.5 tablets (0.25 mg total) by mouth 2 (two) times daily. 60 tablet 0 11/22/2015 at am  . metoprolol succinate (TOPROL-XL) 50 MG 24 hr tablet Take 1 tablet (50 mg total) by mouth daily. 90 tablet 3 11/22/2015 at 0800  . mirtazapine (REMERON) 15 MG tablet Take 1 tablet (15 mg total) by mouth at bedtime. (Patient taking differently: Take 15 mg by mouth at bedtime as needed (for sleep). ) 15 tablet 0 PRN  . ondansetron (ZOFRAN) 4 MG tablet Take 1 tablet (4 mg total) by mouth every 8 (eight) hours as needed for nausea or vomiting. 60 tablet 1 PRN  . oxyCODONE-acetaminophen (PERCOCET/ROXICET) 5-325 MG tablet Take 1-2 tablets by mouth every 4 (four) hours as needed for moderate pain. 30 tablet 0 PRN  . Tamsulosin HCl (FLOMAX) 0.4 MG CAPS Take 0.4 mg by mouth  daily after supper.   11/21/2015 at pm  . warfarin (COUMADIN) 5 MG tablet 0.5 tablet (2.'5mg'$ ) by mouth 5 days a week. Starting on Monday 10/3015. (Patient taking differently: Take 2.5 mg by mouth every other day. In the evening) 90 tablet 0 11/21/2015 at 1830  . fentaNYL (DURAGESIC - DOSED MCG/HR) 12 MCG/HR Place 1 patch (12.5 mcg total) onto the skin every 3 (three) days. (Patient not taking: Reported on 11/22/2015) 10 patch 0 Taking  . omeprazole (PRILOSEC) 20 MG capsule Take 1 capsule (20 mg total) by mouth 2 (two) times daily before a meal. (Patient not taking: Reported on 11/22/2015) 60 capsule 3 Taking  . predniSONE (DELTASONE) 20 MG tablet Take 1 tablet (20 mg total) by mouth daily with breakfast. (Patient not taking: Reported on 11/22/2015) 30 tablet 3 Taking    Current medications: Current  Facility-Administered Medications  Medication Dose Route Frequency Provider Last Rate Last Dose  . acetaminophen (TYLENOL) tablet 650 mg  650 mg Oral Q6H PRN Demetrios Loll, MD   650 mg at 11/27/15 1884   Or  . acetaminophen (TYLENOL) suppository 650 mg  650 mg Rectal Q6H PRN Demetrios Loll, MD      . albuterol (PROVENTIL) (2.5 MG/3ML) 0.083% nebulizer solution 2.5 mg  2.5 mg Nebulization Q2H PRN Demetrios Loll, MD      . aspirin EC tablet 81 mg  81 mg Oral Daily Demetrios Loll, MD   81 mg at 11/27/15 1025  . citalopram (CELEXA) tablet 20 mg  20 mg Oral Daily Forest Gleason, MD   20 mg at 11/27/15 1025  . guaiFENesin (MUCINEX) 12 hr tablet 600 mg  600 mg Oral BID Demetrios Loll, MD   600 mg at 11/27/15 1025  . HYDROcodone-acetaminophen (NORCO/VICODIN) 5-325 MG per tablet 1 tablet  1 tablet Oral Q6H PRN Demetrios Loll, MD   1 tablet at 11/23/15 2027  . ipratropium-albuterol (DUONEB) 0.5-2.5 (3) MG/3ML nebulizer solution 3 mL  3 mL Nebulization Q4H PRN Lytle Butte, MD   3 mL at 11/26/15 0251  . LORazepam (ATIVAN) tablet 0.25 mg  0.25 mg Oral BID Demetrios Loll, MD   0.25 mg at 11/27/15 1025  . mirtazapine (REMERON) tablet 15 mg  15 mg Oral QHS Theodoro Grist, MD      . morphine 2 MG/ML injection 2 mg  2 mg Intravenous Q4H PRN Saundra Shelling, MD   2 mg at 11/26/15 0418  . ondansetron (ZOFRAN) tablet 4 mg  4 mg Oral Q6H PRN Demetrios Loll, MD       Or  . ondansetron [facility]) injection 4 mg  4 mg Intravenous Q6H PRN Demetrios Loll, MD      . oxyCODONE-acetaminophen (PERCOCET/ROXICET) 5-325 MG per tablet 1-2 tablet  1-2 tablet Oral Q4H PRN Demetrios Loll, MD   1 tablet at 11/26/15 1631  . tamsulosin (FLOMAX) capsule 0.4 mg  0.4 mg Oral QPC supper Demetrios Loll, MD   0.4 mg at 11/26/15 2127      Allergies: Allergies  Allergen Reactions  . Clonidine Derivatives Other (See Comments)    Pt was not aware of a reaction to this medication  . Diltiazem Hcl Er Other (See Comments)    High blood pressure  . Statins Other (See Comments)    Myalgias:   Previously on Crestor,Vytorin,Simvastatin,Lipitor & Pravastatin  . Sulfa Antibiotics Swelling      Past Medical History: Past Medical History  Diagnosis Date  . Hypertension   . Coronary artery disease   .  Atrial fibrillation ([place]) 07/2015  . Primary cancer of left upper lobe of lung ([place]) 10/06/2015     Past Surgical History: Past Surgical History  Procedure Laterality Date  . Coronary artery bypass graft  12/10/2005    LIMA to LAD,SVG to diagonal,SVG to ramus intermediate,SVG to distal RCA.  . Tonsillectomy    . Tee without cardioversion  05/10/2012    Procedure: TRANSESOPHAGEAL ECHOCARDIOGRAM (TEE);  Surgeon: [name], MD;  Location[facility] ENDOSCOPY;  Service: Cardiovascular;  Laterality: N/A;  . Cardioversion  05/10/2012    Procedure: CARDIOVERSION;  Surgeon: [name], MD;  Location: [facility] ENDOSCOPY;  Service: Cardiovascular;  Laterality: N/A;  . Cardiac catheterization  10/28/2005    significant 3 vessel CAD  . Nm myocar perf wall motion  09/16/2005    evidence of diaphragmatic attenuation & subtle anterolateral ischemia.  . Cardioversion N/A 07/31/2015    Procedure: CARDIOVERSION;  Surgeon: Josue Hector, MD;  Location: [facility] ENDOSCOPY;  Service: Cardiovascular;  Laterality: N/A;  . Peripheral vascular catheterization N/A 10/18/2015    Procedure: Glori Luis Cath Insertion;  Surgeon: Algernon Huxley, MD;  Location: [facility] CV LAB;  Service: Cardiovascular;  Laterality: N/A;     Family History: Family History  Problem Relation Age of Onset  . Stroke Mother 76  . Stroke Father 43  . Heart attack Father 2  . Heart attack Brother 11  . Cancer Sister 73     Social History: Social History   Social History  . Marital Status: Married    Spouse Name: N/A  . Number of Children: N/A  . Years of Education: N/A   Occupational History  . Not on file.   Social History Main Topics  . Smoking status: Former Smoker -- 1.00 packs/day for 40 years    Types: Cigarettes     Quit date: 09/19/2005  . Smokeless tobacco: Never Used  . Alcohol Use: No  . Drug Use: No  . Sexual Activity: Not on file   Other Topics Concern  . Not on file   Social History Narrative     Review of Systems: Review of Systems  Constitutional: Positive for weight loss and malaise/fatigue. Negative for fever and chills.  HENT: Negative for ear discharge and tinnitus.   Eyes: Negative for blurred vision and double vision.  Respiratory: Positive for cough, sputum production and shortness of breath. Negative for hemoptysis.   Cardiovascular: Positive for orthopnea. Negative for chest pain and palpitations.  Gastrointestinal: Positive for nausea and vomiting.  Genitourinary: Negative for dysuria.  Musculoskeletal: Negative for myalgias and neck pain.  Skin: Negative for rash.  Neurological: Positive for dizziness. Negative for focal weakness, loss of consciousness and headaches.  Endo/Heme/Allergies: Does not bruise/bleed easily.  Psychiatric/Behavioral: Negative for depression. The patient is not nervous/anxious.       Vital Signs: Blood pressure 106/63, pulse 103, temperature 97.2 F (36.2 C), temperature source Oral, resp. rate 18, height '5\' 6"'$  (1.676 m), weight 81.602 kg (179 lb 14.4 oz), SpO2 100 %.  Weight trends: Filed Weights   11/22/15 2140 11/26/15 2300 11/27/15 0454  Weight: 82.918 kg (182 lb 12.8 oz) 80.196 kg (176 lb 12.8 oz) 81.602 kg (179 lb 14.4 oz)    Physical Exam: General: NAD, laying in bed  Head: Normocephalic, atraumatic.  Eyes: Anicteric, EOMI  Nose: Mucous membranes moist, not inflammed, nonerythematous.  Throat: Thrush noted  Neck: Supple, trachea midline.  Lungs:  Normal respiratory effort. Scattered rhonchi  Heart: S1S2 irregular, no rubs  Abdomen:  BS normoactive. Soft, Nondistended, non-tender.  No masses or organomegaly.  Extremities: Trace lower extremity edema  Neurologic: A&O X3, Motor strength is 5/5 in the all 4 extremities   Skin: No visible rashes, scars.    Lab results: Basic Metabolic Panel:  Recent Labs Lab 11/22/15 1638  11/25/15 1225 11/25/15 1835 11/26/15 9326 11/27/15 0609  NA 133*  < > 135 136  --  140  K 4.7  < > 5.2* 4.8  --  3.9  CL 104  < > 110 108  --  110  CO2 19*  < > 16* 19*  --  22  GLUCOSE 104*  < > 128* 124*  --  108*  BUN 41*  < > 37* 36*  --  41*  CREATININE 2.50*  < > 2.01* 1.91* 2.18* 1.99*  CALCIUM 7.7*  < > 7.7* 7.7*  --  7.2*  MG 2.0  --   --   --   --   --   < > = values in this interval not displayed.  Liver Function Tests:  Recent Labs Lab 11/22/15 1638 11/25/15 1835 11/27/15 0609  AST '26 24 23  '$ ALT '29 21 25  '$ ALKPHOS 103 88 92  BILITOT 0.7 0.8 0.5  PROT 6.4* 6.0* 5.6*  ALBUMIN 2.5* 2.2* 2.2*   No results for input(s): LIPASE, AMYLASE in the last 168 hours. No results for input(s): AMMONIA in the last 168 hours.  CBC:  Recent Labs Lab 11/22/15 1410 11/22/15 1638 11/23/15 0529 11/26/15 0638 11/27/15 0609  WBC 9.1 7.4 6.4 8.6 8.6  NEUTROABS 7.5* 6.1  --   --  7.5*  HGB 12.7* 11.3* 11.4* 11.8* 11.2*  HCT 38.3* 34.6* 34.8* 36.0* 34.4*  MCV 80.6 79.7* 82.0 81.9 81.7  PLT 196 178 168 216 227    Cardiac Enzymes:  Recent Labs Lab 11/22/15 1638  TROPONINI <0.03    BNP: Invalid input(s): POCBNP  CBG:  Recent Labs Lab 11/22/15 2242  GLUCAP 21    Microbiology: Results for orders placed or performed during the hospital encounter of 11/22/15  Body fluid culture     Status: None (Preliminary result)   Collection Time: 11/26/15  2:22 PM  Result Value Ref Range Status   Specimen Description PLEURAL  Final   Special Requests NONE  Final   Gram Stain PENDING  Incomplete   Culture NO GROWTH < 24 HOURS  Final   Report Status PENDING  Incomplete    Coagulation Studies:  Recent Labs  11/25/15 0712 11/26/15 0638  LABPROT 16.8* 16.9*  INR 1.35 1.36    Urinalysis: No results for input(s): COLORURINE, LABSPEC, PHURINE, GLUCOSEU,  HGBUR, BILIRUBINUR, KETONESUR, PROTEINUR, UROBILINOGEN, NITRITE, LEUKOCYTESUR in the last 72 hours.  Invalid input(s): APPERANCEUR    Imaging: X-ray Chest Pa Or Ap  11/26/2015  CLINICAL DATA:  Status post left-sided thoracentesis. EXAM: CHEST 1 VIEW COMPARISON:  November 26, 2015. FINDINGS: Stable cardiomegaly. Status post coronary artery bypass graft. No change in position of right internal jugular Port-A-Cath. Left pleural effusion noted on prior exam is significantly smaller status post thoracentesis. No pneumothorax is noted. Minimal right pleural effusion is noted. Mild left basilar atelectasis is noted as well. IMPRESSION: No pneumothorax status post left-sided thoracentesis. Left pleural effusion is significantly smaller, with mild residual left basilar atelectasis. Electronically Signed   By: Marijo Conception, M.D.   On: 11/26/2015 15:01   Dg Chest 2 View  11/25/2015  CLINICAL DATA:  81 year old male with  metastatic left lung cancer. Weakness, dizziness and extreme shortness of breath. Initial encounter. EXAM: CHEST  2 VIEW COMPARISON:  11/22/2015 and earlier. FINDINGS: Seated AP and lateral views of the chest. Moderate to large left pleural effusion is stable since 11/22/2015. Stable cardiac size and mediastinal contours. Right chest porta cath remains in place. Stable pulmonary vascularity without overt edema. No pneumothorax. No right pleural effusion or new pulmonary opacity. Sequelae of CABG. No acute osseous abnormality identified. IMPRESSION: Stable with moderate to large left pleural effusion since 11/22/2015. No new cardiopulmonary abnormality. Electronically Signed   By: Genevie Ann M.D.   On: 11/25/2015 13:51   Dg Chest Port 1 View  11/26/2015  CLINICAL DATA:  Dyspnea, recent diagnosis of left upper lobe lung malignancy, acute renal failure, history of CABG, former smoker. EXAM: PORTABLE CHEST 1 VIEW COMPARISON:  PA and lateral chest x-ray of November 25, 2015 FINDINGS: Further opacification of  the left hemi thorax has occurred likely due to pleural fluid new layering posteriorly. There is no significant mediastinal shift toward the right. The right lung is clear. Only a small portion of the left upper lobe remains aerated. The cardiac silhouette appears enlarged but its margins on the left are indistinct. There is mild central pulmonary vascular prominence. The Port-A-Cath appliance tip projects over the proximal SVC. The bony thorax exhibits no acute abnormality. IMPRESSION: Increased left pleural effusion may be in part due to the portable technique though the film is labeled "erect". Yesterday's study was AP "sitting" in position. Worsening of probable left upper lobe atelectasis. If the patient's clinical status has significantly deteriorated, ultrasound of the thorax may be useful to judge whether there is significant pleural fluid volume warranting thoracentesis. Electronically Signed   By: David  Martinique M.D.   On: 11/26/2015 08:02   US Thoracentesis Asp Pleural Space W/img Guide  11/26/2015  INDICATION: Left pleural effusion. EXAM: ULTRASOUND GUIDED left THORACENTESIS MEDICATIONS: None. COMPLICATIONS: None immediate. PROCEDURE: An ultrasound guided thoracentesis was thoroughly discussed with the patient and questions answered. The benefits, risks, alternatives and complications were also discussed. The patient understands and wishes to proceed with the procedure. Written consent was obtained. Ultrasound was performed to localize and mark an adequate pocket of fluid in the left chest. The area was then prepped and draped in the normal sterile fashion. 1% Lidocaine was used for local anesthesia. Under ultrasound guidance a Safe-T-Centesis catheter was introduced. Thoracentesis was performed. The catheter was removed and a dressing applied. FINDINGS: A total of approximately 1000 mL of serous fluid was removed. Samples were sent to the laboratory as requested by the clinical team. IMPRESSION:  Successful ultrasound guided left thoracentesis yielding 1 L of pleural fluid. Electronically Signed   By: Marijo Conception, M.D.   On: 11/26/2015 15:04      Assessment & Plan: Pt is a 81 y.o. male with a PMHx of hypertension, coronary artery disease, atrial fibrillation, metastatic left upper lobe lung cancer, who was admitted to [facility] on 11/22/2015 for evaluation of dizziness, weakness and poor by mouth intake.    1.  Acute renal failure/chronic kidney disease stage III baseline creatinine 1.1.  Acute renal failure secondary to poor by mouth intake, nausea, vomiting related to chemotherapy.   -  Renal function has been improving over the course of the hospitalization.  IV fluids stopped secondary to increasing shortness of breath.  Patient was given Lasix.  Encouraged patient to increase by mouth fluid intake as tolerated.  We will check renal  ultrasound to make sure there is no underlying obstruction.  2.  Anemia chronic kidney disease.  Hemoglobin currently 11.2.  No indication for Procrit.  Hematology also following.  3.  Metastatic stage IV lung cancer.  Dr. Oliva Bustard currently following.

## 2015-11-28 ENCOUNTER — Inpatient Hospital Stay: Payer: Medicare Other

## 2015-11-28 DIAGNOSIS — J910 Malignant pleural effusion: Secondary | ICD-10-CM

## 2015-11-28 DIAGNOSIS — Z9981 Dependence on supplemental oxygen: Secondary | ICD-10-CM

## 2015-11-28 DIAGNOSIS — Z515 Encounter for palliative care: Secondary | ICD-10-CM

## 2015-11-28 DIAGNOSIS — R0600 Dyspnea, unspecified: Secondary | ICD-10-CM

## 2015-11-28 DIAGNOSIS — N189 Chronic kidney disease, unspecified: Secondary | ICD-10-CM

## 2015-11-28 LAB — BASIC METABOLIC PANEL
Anion gap: 12 (ref 5–15)
BUN: 43 mg/dL — AB (ref 6–20)
CHLORIDE: 106 mmol/L (ref 101–111)
CO2: 19 mmol/L — ABNORMAL LOW (ref 22–32)
CREATININE: 1.97 mg/dL — AB (ref 0.61–1.24)
Calcium: 7.2 mg/dL — ABNORMAL LOW (ref 8.9–10.3)
GFR calc Af Amer: 35 mL/min — ABNORMAL LOW (ref >60)
GFR, EST NON AFRICAN AMERICAN: 31 mL/min — AB (ref >60)
Glucose, Bld: 113 mg/dL — ABNORMAL HIGH (ref 65–99)
Potassium: 3.5 mmol/L (ref 3.5–5.1)
SODIUM: 137 mmol/L (ref 135–145)

## 2015-11-28 LAB — PROTIME-INR
INR: 1.36
Prothrombin Time: 16.9 seconds — ABNORMAL HIGH (ref 11.4–15.0)

## 2015-11-28 MED ORDER — LORAZEPAM 0.5 MG PO TABS: 0.5000 mg | ORAL_TABLET | ORAL | Status: AC | PRN

## 2015-11-28 MED ORDER — GUAIFENESIN 100 MG/5ML PO SOLN: 5.0000 mL | ORAL | Status: AC | PRN

## 2015-11-28 MED ORDER — PROCHLORPERAZINE 25 MG RE SUPP: 25.0000 mg | Freq: Three times a day (TID) | RECTAL | Status: AC | PRN

## 2015-11-28 MED ORDER — LORAZEPAM 0.5 MG PO TABS: 0.2500 mg | ORAL_TABLET | Freq: Two times a day (BID) | ORAL | Status: DC

## 2015-11-28 MED ORDER — MORPHINE SULFATE (CONCENTRATE) 10 MG/0.5ML PO SOLN
5.0000 mg | ORAL | Status: DC | PRN
  Administered 2015-11-28 – 2015-11-29 (×2): 5 mg via ORAL
  Filled 2015-11-28 (×2): qty 1

## 2015-11-28 MED ORDER — MORPHINE SULFATE (CONCENTRATE) 10 MG/0.5ML PO SOLN: 5.0000 mg | ORAL | Status: AC | PRN

## 2015-11-28 MED ORDER — GLYCOPYRROLATE 1 MG PO TABS: 1.0000 mg | ORAL_TABLET | Freq: Three times a day (TID) | ORAL | Status: DC | PRN

## 2015-11-28 MED ORDER — PROCHLORPERAZINE 25 MG RE SUPP: 25.0000 mg | Freq: Three times a day (TID) | RECTAL | Status: DC | PRN

## 2015-11-28 MED ORDER — LORAZEPAM 0.5 MG PO TABS: 0.5000 mg | ORAL_TABLET | ORAL | Status: DC | PRN

## 2015-11-28 MED ORDER — GUAIFENESIN 100 MG/5ML PO SOLN: 5.0000 mL | ORAL | Status: DC | PRN

## 2015-11-28 MED ORDER — MORPHINE SULFATE (CONCENTRATE) 10 MG/0.5ML PO SOLN: 5.0000 mg | ORAL | Status: DC | PRN

## 2015-11-28 MED ORDER — IPRATROPIUM-ALBUTEROL 0.5-2.5 (3) MG/3ML IN SOLN: 3.0000 mL | RESPIRATORY_TRACT | Status: AC | PRN

## 2015-11-28 MED ORDER — SODIUM CHLORIDE 0.9 % IV BOLUS (SEPSIS)
200.0000 mL | Freq: Once | INTRAVENOUS | Status: AC
  Administered 2015-11-28: 200 mL via INTRAVENOUS

## 2015-11-28 MED ORDER — BISACODYL 10 MG RE SUPP: 10.0000 mg | Freq: Every day | RECTAL | Status: AC | PRN

## 2015-11-28 MED ORDER — BISACODYL 10 MG RE SUPP: 10.0000 mg | Freq: Every day | RECTAL | Status: DC | PRN

## 2015-11-28 NOTE — Progress Notes (Signed)
[facility]  ROUNDING NOTE   Subjective:  Creatinine about the same at 1.9. Found to have left-sided hydronephrosis on renal ultrasound. Urology has been consulted.   Objective:  Vital signs in last 24 hours:  Temp:  [97.4 F (36.3 C)-97.9 F (36.6 C)] 97.9 F (36.6 C) (02/08 1055) Pulse Rate:  [90-125] 105 (02/08 1257) Resp:  [18] 18 (02/08 1055) BP: (83-104)/(58-67) 99/63 mmHg (02/08 1257) SpO2:  [95 %-99 %] 95 % (02/08 1257) Weight:  [80.695 kg (177 lb 14.4 oz)] 80.695 kg (177 lb 14.4 oz) (02/08 0402)  Weight change: 0.499 kg (1 lb 1.6 oz) Filed Weights   11/26/15 2300 11/27/15 0454 11/28/15 0402  Weight: 80.196 kg (176 lb 12.8 oz) 81.602 kg (179 lb 14.4 oz) 80.695 kg (177 lb 14.4 oz)    Intake/Output: I/O last 3 completed shifts: In: 240 [P.O.:240] Out: -    Intake/Output this shift:     Physical Exam: General: NAD, sitting up in chair  Head: Normocephalic, atraumatic. Moist oral mucosal membranes  Eyes: Anicteric  Neck: Supple, trachea midline  Lungs:  Diminished bilateral normal effort  Heart: irregular  Abdomen:  Soft, nontender, BS present  Extremities: no peripheral edema.  Neurologic: Nonfocal, moving all four extremities  Skin: No lesions       Basic Metabolic Panel:  Recent Labs Lab 11/22/15 1638  11/25/15 0712 11/25/15 1225 11/25/15 1835 11/26/15 0638 11/27/15 0609 11/28/15 0423  NA 133*  < > 138 135 136  --  140 137  K 4.7  < > 6.5* 5.2* 4.8  --  3.9 3.5  CL 104  < > 110 110 108  --  110 106  CO2 19*  < > 19* 16* 19*  --  22 19*  GLUCOSE 104*  < > 120* 128* 124*  --  108* 113*  BUN 41*  < > 35* 37* 36*  --  41* 43*  CREATININE 2.50*  < > 1.99* 2.01* 1.91* 2.18* 1.99* 1.97*  CALCIUM 7.7*  < > 7.9* 7.7* 7.7*  --  7.2* 7.2*  MG 2.0  --   --   --   --   --   --   --   < > = values in this interval not displayed.  Liver Function Tests:  Recent Labs Lab 11/22/15 1638 11/25/15 1835 11/27/15 0609  AST '26 24 23  '$ ALT '29  21 25  '$ ALKPHOS 103 88 92  BILITOT 0.7 0.8 0.5  PROT 6.4* 6.0* 5.6*  ALBUMIN 2.5* 2.2* 2.2*   No results for input(s): LIPASE, AMYLASE in the last 168 hours. No results for input(s): AMMONIA in the last 168 hours.  CBC:  Recent Labs Lab 11/22/15 1410 11/22/15 1638 11/23/15 0529 11/26/15 0638 11/27/15 0609  WBC 9.1 7.4 6.4 8.6 8.6  NEUTROABS 7.5* 6.1  --   --  7.5*  HGB 12.7* 11.3* 11.4* 11.8* 11.2*  HCT 38.3* 34.6* 34.8* 36.0* 34.4*  MCV 80.6 79.7* 82.0 81.9 81.7  PLT 196 178 168 216 227    Cardiac Enzymes:  Recent Labs Lab 11/22/15 1638  TROPONINI <0.03    BNP: Invalid input(s): POCBNP  CBG:  Recent Labs Lab 11/22/15 2242  GLUCAP 54    Microbiology: Results for orders placed or performed during the hospital encounter of 11/22/15  Body fluid culture     Status: None (Preliminary result)   Collection Time: 11/26/15  2:22 PM  Result Value Ref Range Status   Specimen Description PLEURAL  Final   Special Requests NONE  Final   Gram Stain   Final    RARE RED BLOOD CELLS NO WBC SEEN NO ORGANISMS SEEN    Culture NO GROWTH 2 DAYS  Final   Report Status PENDING  Incomplete    Coagulation Studies:  Recent Labs  11/26/15 0638 11/28/15 0423  LABPROT 16.9* 16.9*  INR 1.36 1.36    Urinalysis: No results for input(s): COLORURINE, LABSPEC, PHURINE, GLUCOSEU, HGBUR, BILIRUBINUR, KETONESUR, PROTEINUR, UROBILINOGEN, NITRITE, LEUKOCYTESUR in the last 72 hours.  Invalid input(s): APPERANCEUR    Imaging: X-ray Chest Pa Or Ap  11/26/2015  CLINICAL DATA:  Status post left-sided thoracentesis. EXAM: CHEST 1 VIEW COMPARISON:  November 26, 2015. FINDINGS: Stable cardiomegaly. Status post coronary artery bypass graft. No change in position of right internal jugular Port-A-Cath. Left pleural effusion noted on prior exam is significantly smaller status post thoracentesis. No pneumothorax is noted. Minimal right pleural effusion is noted. Mild left basilar atelectasis is  noted as well. IMPRESSION: No pneumothorax status post left-sided thoracentesis. Left pleural effusion is significantly smaller, with mild residual left basilar atelectasis. Electronically Signed   By: Marijo Conception, M.D.   On: 11/26/2015 15:01   US Renal  11/27/2015  CLINICAL DATA:  Patient with acute renal failure. EXAM: RENAL / URINARY TRACT ULTRASOUND COMPLETE COMPARISON:  CT abdomen pelvis 12/26/2014 FINDINGS: Right Kidney: Length: 10.1 cm. Normal renal cortical thickness and echogenicity. No hydronephrosis. There is a 2.0 x 2.3 x 2.4 cm cyst within the inferior pole. There is an indeterminate sub cm hypoechoic lesion within the superior pole. Left Kidney: Length: 13.0 cm. Normal renal cortical thickness and echogenicity. Mild hydronephrosis. Bladder: The prostate is enlarged. IMPRESSION: Mild left-sided hydronephrosis. Electronically Signed   By: Lovey Newcomer M.D.   On: 11/27/2015 15:48   Dg Chest Port 1 View  11/28/2015  CLINICAL DATA:  Hypotension; known left-sided pleural effusion, chronic renal failure, stage IV lung malignancy. EXAM: PORTABLE CHEST 1 VIEW COMPARISON:  Portable chest x-ray of November 26, 2015 FINDINGS: The right lung is reasonably well inflated. The interstitial markings are coarse though stable. On the left volume loss has increased with a pleural effusion occupying approximately 1/2 of the pleural space volume. The interstitial markings in the aerated upper lobe are increased. The cardiac silhouette remains enlarged. The central pulmonary vascularity is mildly engorged and is indistinct. The sternal wires are intact. The Port-A-Cath appliance tip projects over the midportion of the SVC. IMPRESSION: Interval increase in the volume of pleural fluid on the left. Stable cardiomegaly. Mild pulmonary vascular congestion predominantly on the left more conspicuous than on the previous study. Electronically Signed   By: David  Martinique M.D.   On: 11/28/2015 13:36   US Thoracentesis Asp  Pleural Space W/img Guide  11/26/2015  INDICATION: Left pleural effusion. EXAM: ULTRASOUND GUIDED left THORACENTESIS MEDICATIONS: None. COMPLICATIONS: None immediate. PROCEDURE: An ultrasound guided thoracentesis was thoroughly discussed with the patient and questions answered. The benefits, risks, alternatives and complications were also discussed. The patient understands and wishes to proceed with the procedure. Written consent was obtained. Ultrasound was performed to localize and mark an adequate pocket of fluid in the left chest. The area was then prepped and draped in the normal sterile fashion. 1% Lidocaine was used for local anesthesia. Under ultrasound guidance a Safe-T-Centesis catheter was introduced. Thoracentesis was performed. The catheter was removed and a dressing applied. FINDINGS: A total of approximately 1000 mL of serous fluid was removed. Samples were sent  to the laboratory as requested by the clinical team. IMPRESSION: Successful ultrasound guided left thoracentesis yielding 1 L of pleural fluid. Electronically Signed   By: Marijo Conception, M.D.   On: 11/26/2015 15:04     Medications:     . aspirin EC  81 mg Oral Daily  . citalopram  20 mg Oral Daily  . guaiFENesin  600 mg Oral BID  . LORazepam  0.25 mg Oral BID  . mirtazapine  15 mg Oral QHS  . tamsulosin  0.4 mg Oral QPC supper  . Warfarin - Physician Dosing Inpatient   Does not apply q1800   acetaminophen **OR** acetaminophen, albuterol, ipratropium-albuterol, morphine injection, ondansetron **OR** ondansetron (ZOFRAN) IV, oxyCODONE-acetaminophen  Assessment/ Plan:  80 y.o. male with a PMHx of hypertension, coronary artery disease, atrial fibrillation, metastatic left upper lobe lung cancer, who was admitted to [facility] on 11/22/2015 for evaluation of dizziness, weakness and poor by mouth intake.   1. Acute renal failure/chronic kidney disease stage III baseline creatinine 1.1/left sided hydronephrosis. Acute renal failure  secondary to poor by mouth intake, nausea, vomiting related to chemotherapy. Initially given IVFs but stopped due to increasing shortness of breath. - left sided hydronephrosis noted on renal ultrasound.  We have consulted urology for this issue.  We will hold off on administering any further IV fluids at this time as he had worsening shortness of breath when he was on IV fluids.  2. Anemia chronic kidney disease. at last check hemoglobin was 11.2.  No indication for Procrit.  3. Metastatic stage IV lung cancer:  Status postleft thoracentesis 11/26/15. -  Breathing appears to be fairly comfortable at the moment.  Unclear if he is a candidate for any further chemotherapy.   LOS: 6 Avigdor Dollar 2/8/20171:59 PM

## 2015-11-28 NOTE — Progress Notes (Signed)
[place] [facility] [place] NAME: [name]    MR#:  [id number]  DATE OF BIRTH:  1935/02/12  SUBJECTIVE:  CHIEF COMPLAINT:   Chief Complaint  Patient presents with  . Hypotension  . Dizziness   patient is 81 year old male with past medical history significant for history of essential hypertension, coronary artery disease, atrial fibrillation, on Coumadin therapy, stage IV lung cancer with metastasis who presents to the hospital with very poor oral intake, dizziness and generalized weakness. He was seen by Dr. Oliva Bustard and was found to have hypotension with systolic blood pressure in 80s. He was sent to emergency room and was admitted. Labs revealed acute renal failure, creatinine of 2.5, baseline being at 1.2-1.6. Patient was given IV fluids, but became  more short of breath and repeated chest x-ray revealed worsening left pleural effusion. Patient's IV fluids were stopped and Lasix IV was administered with some improvement of his respiratory status. Left thoracentesis was performed on February 6 .  Remains on oxygen therapy , still dyspneic  Patient is off Lasix . Patient was seen by oncologist , recommended to follow up for chemotherapy in about 1-2 weeks.  Left pleural effusion is considered to be malignant at present, although pathology results are pending Patient short of breath, remains hypotensive. Repeated chest x-ray revealed accumulation of pleural fluid on the left with mild pulmonary vascular congestion on the left.  Palliative care as well as thoracic surgery is consulted for possible Pleurx catheter placement as well as discussions about goals of care.  Review of Systems  Constitutional: Negative for fever, chills and weight loss.  HENT: Negative for congestion.   Eyes: Negative for blurred vision and double vision.  Respiratory: Positive for cough. Negative for sputum production, shortness of breath and wheezing.   Cardiovascular:  Negative for chest pain, palpitations, orthopnea, leg swelling and PND.  Gastrointestinal: Positive for constipation. Negative for nausea, vomiting, abdominal pain, diarrhea and blood in stool.  Genitourinary: Negative for dysuria, urgency, frequency and hematuria.  Musculoskeletal: Negative for falls.  Neurological: Negative for dizziness, tremors, focal weakness and headaches.  Endo/Heme/Allergies: Does not bruise/bleed easily.  Psychiatric/Behavioral: Negative for depression. The patient does not have insomnia.     VITAL SIGNS: Blood pressure 99/63, pulse 105, temperature 97.9 F (36.6 C), temperature source Oral, resp. rate 18, height '5\' 6"'$  (1.676 m), weight 80.695 kg (177 lb 14.4 oz), SpO2 95 %.  PHYSICAL EXAMINATION:   GENERAL:  81 y.o.-year-old patient lying in the bed in mild to moderate respiratory distress. Dysarthric, gray complexion, dyspneic, especially on exertion  EYES: Pupils equal, round, reactive to light and accommodation. No scleral icterus. Extraocular muscles intact.  HEENT: Head atraumatic, normocephalic. Oropharynx and nasopharynx clear.  NECK:  Supple, no jugular venous distention. No thyroid enlargement, no tenderness.  LUNGS:diminished  breath sounds  on the left base, dullness to percussion on the left half lung field,  scattered wheezing, basilar rales,rhonchi or crepitation noted intermittently, scattered. Using accessory muscles of respiration, especially with speech  CARDIOVASCULAR: S1, S2 normal. No murmurs, rubs, or gallops.  ABDOMEN: Soft, nontender, nondistended. Bowel sounds present. No organomegaly or mass.  EXTREMITIES: No pedal edema, cyanosis, or clubbing.  NEUROLOGIC: Cranial nerves II through XII are intact. Muscle strength 5/5 in all extremities. Sensation intact. Gait not checked.  PSYCHIATRIC: The patient is alert and oriented x 3.  SKIN: No obvious rash, lesion, or ulcer.   ORDERS/RESULTS REVIEWED:   CBC  Recent Labs Lab  11/22/15 1410  11/22/15 1638 11/23/15 0529 11/26/15 0638 11/27/15 0609  WBC 9.1 7.4 6.4 8.6 8.6  HGB 12.7* 11.3* 11.4* 11.8* 11.2*  HCT 38.3* 34.6* 34.8* 36.0* 34.4*  PLT 196 178 168 216 227  MCV 80.6 79.7* 82.0 81.9 81.7  MCH 26.7 26.2 26.9 26.8 26.6  MCHC 33.1 32.8 32.8 32.7 32.6  RDW 17.1* 16.5* 17.1* 17.1* 17.2*  LYMPHSABS 0.9* 0.8*  --   --  0.6*  MONOABS 0.5 0.5  --   --  0.4  EOSABS 0.0 0.0  --   --  0.0  BASOSABS 0.1 0.1  --   --  0.1   ------------------------------------------------------------------------------------------------------------------  Chemistries   Recent Labs Lab 11/22/15 1638  11/25/15 0712 11/25/15 1225 11/25/15 1835 11/26/15 0638 11/27/15 0609 11/28/15 0423  NA 133*  < > 138 135 136  --  140 137  K 4.7  < > 6.5* 5.2* 4.8  --  3.9 3.5  CL 104  < > 110 110 108  --  110 106  CO2 19*  < > 19* 16* 19*  --  22 19*  GLUCOSE 104*  < > 120* 128* 124*  --  108* 113*  BUN 41*  < > 35* 37* 36*  --  41* 43*  CREATININE 2.50*  < > 1.99* 2.01* 1.91* 2.18* 1.99* 1.97*  CALCIUM 7.7*  < > 7.9* 7.7* 7.7*  --  7.2* 7.2*  MG 2.0  --   --   --   --   --   --   --   AST 26  --   --   --  24  --  23  --   ALT 29  --   --   --  21  --  25  --   ALKPHOS 103  --   --   --  88  --  92  --   BILITOT 0.7  --   --   --  0.8  --  0.5  --   < > = values in this interval not displayed. ------------------------------------------------------------------------------------------------------------------ estimated creatinine clearance is 30.4 mL/min (by C-G formula based on Cr of 1.97). ------------------------------------------------------------------------------------------------------------------ No results for input(s): TSH, T4TOTAL, T3FREE, THYROIDAB in the last 72 hours.  Invalid input(s): FREET3  Cardiac Enzymes  Recent Labs Lab 11/22/15 1638  TROPONINI <0.03    ------------------------------------------------------------------------------------------------------------------ Invalid input(s): POCBNP ---------------------------------------------------------------------------------------------------------------  RADIOLOGY: X-ray Chest Pa Or Ap  11/26/2015  CLINICAL DATA:  Status post left-sided thoracentesis. EXAM: CHEST 1 VIEW COMPARISON:  November 26, 2015. FINDINGS: Stable cardiomegaly. Status post coronary artery bypass graft. No change in position of right internal jugular Port-A-Cath. Left pleural effusion noted on prior exam is significantly smaller status post thoracentesis. No pneumothorax is noted. Minimal right pleural effusion is noted. Mild left basilar atelectasis is noted as well. IMPRESSION: No pneumothorax status post left-sided thoracentesis. Left pleural effusion is significantly smaller, with mild residual left basilar atelectasis. Electronically Signed   By: Marijo Conception, M.D.   On: 11/26/2015 15:01   US Renal  11/27/2015  CLINICAL DATA:  Patient with acute renal failure. EXAM: RENAL / URINARY TRACT ULTRASOUND COMPLETE COMPARISON:  CT abdomen pelvis 12/26/2014 FINDINGS: Right Kidney: Length: 10.1 cm. Normal renal cortical thickness and echogenicity. No hydronephrosis. There is a 2.0 x 2.3 x 2.4 cm cyst within the inferior pole. There is an indeterminate sub cm hypoechoic lesion within the superior pole. Left Kidney: Length: 13.0 cm. Normal renal cortical thickness and  echogenicity. Mild hydronephrosis. Bladder: The prostate is enlarged. IMPRESSION: Mild left-sided hydronephrosis. Electronically Signed   By: Lovey Newcomer M.D.   On: 11/27/2015 15:48   Dg Chest Port 1 View  11/28/2015  CLINICAL DATA:  Hypotension; known left-sided pleural effusion, chronic renal failure, stage IV lung malignancy. EXAM: PORTABLE CHEST 1 VIEW COMPARISON:  Portable chest x-ray of November 26, 2015 FINDINGS: The right lung is reasonably well inflated. The  interstitial markings are coarse though stable. On the left volume loss has increased with a pleural effusion occupying approximately 1/2 of the pleural space volume. The interstitial markings in the aerated upper lobe are increased. The cardiac silhouette remains enlarged. The central pulmonary vascularity is mildly engorged and is indistinct. The sternal wires are intact. The Port-A-Cath appliance tip projects over the midportion of the SVC. IMPRESSION: Interval increase in the volume of pleural fluid on the left. Stable cardiomegaly. Mild pulmonary vascular congestion predominantly on the left more conspicuous than on the previous study. Electronically Signed   By: David  Martinique M.D.   On: 11/28/2015 13:36   US Thoracentesis Asp Pleural Space W/img Guide  11/26/2015  INDICATION: Left pleural effusion. EXAM: ULTRASOUND GUIDED left THORACENTESIS MEDICATIONS: None. COMPLICATIONS: None immediate. PROCEDURE: An ultrasound guided thoracentesis was thoroughly discussed with the patient and questions answered. The benefits, risks, alternatives and complications were also discussed. The patient understands and wishes to proceed with the procedure. Written consent was obtained. Ultrasound was performed to localize and mark an adequate pocket of fluid in the left chest. The area was then prepped and draped in the normal sterile fashion. 1% Lidocaine was used for local anesthesia. Under ultrasound guidance a Safe-T-Centesis catheter was introduced. Thoracentesis was performed. The catheter was removed and a dressing applied. FINDINGS: A total of approximately 1000 mL of serous fluid was removed. Samples were sent to the laboratory as requested by the clinical team. IMPRESSION: Successful ultrasound guided left thoracentesis yielding 1 L of pleural fluid. Electronically Signed   By: Marijo Conception, M.D.   On: 11/26/2015 15:04    EKG:  Orders placed or performed during the hospital encounter of 11/22/15  . ED EKG  .  ED EKG    ASSESSMENT AND PLAN:  Active Problems:   ARF (acute renal failure) (HCC)   Hypotension   Supratherapeutic INR #1 Acute on chronic renal failure, which was initially felt to be due to dehydration,  poor by mouth intake, now off IV fluids due to worsening left pleural effusion/congestive heart failure. Following creatinine closely, stable at present. Worse with diuresis, and Lasix was stopped  #2 failure to thrive, adult , etiology is likely metastatic lung cancer, respiratory failure ,  also patient reported some element of constipation, added Senokot , had bowel movement , oral intake improved, appreciate dietary input.   #3. Dysphagia,  the patient underwent a modified barium swallowing study today , recommended  dysphagia  1 diet  With nectar   thin liquids,  appreciate speech therapist involvement, the arteries involved in the patient's family education #4. Hyponatremia, urine and blood osmolarities revealed inappropriate antidiuretic hormone release, SIADH, resolved With IV fluids, now patient is off IV fluids. Stable sodium level today  #5. Coagulopathy, acquired, now off Coumadin, INR is 1.36   resume  Coumadin after patient is evaluated for Pleurx procedure by Dr. Genevive Bi , following ProTime as needed  #6. Acute diastolic CHF, patient had echocardiogram done in October 2016,  off Lasix , . Continue it as needed only #  7. Left pleural effusion, concerning for malignant,   status post left thoracentesis February 6 with 1.0 L of fluid removed, no growth , discussed with Dr. Genevive Bi who is coming to evaluate patient for Pleurx catheter placement consideration. Consult palliative care as well #8. Hyperkalemia, patient was given Lasix, Kayexalate , potassium has improved, stable  #9. Acute respiratory failure  with hypoxia due to pleural effusion, acute diastolic CHF, status post thoracentesis,  continue  oxygen therapy,  likely need oxygen at home, discussed with care management #10.  Hypotension, due to large left pleural effusion and thoracic organ shift,  given 200 cc of normal saline infusion for systolic blood pressure of 83 earlier today Management plans discussed with the patient, family and they are in agreement.   DRUG ALLERGIES:  Allergies  Allergen Reactions  . Clonidine Derivatives Other (See Comments)    Pt was not aware of a reaction to this medication  . Diltiazem Hcl Er Other (See Comments)    High blood pressure  . Statins Other (See Comments)    Myalgias:  Previously on Crestor,Vytorin,Simvastatin,Lipitor & Pravastatin  . Sulfa Antibiotics Swelling    CODE STATUS:     Code Status Orders        Start     Ordered   11/22/15 1946  Do not attempt resuscitation (DNR)   Continuous    Question Answer Comment  In the event of cardiac or respiratory ARREST Do not call a "code blue"   In the event of cardiac or respiratory ARREST Do not perform Intubation, CPR, defibrillation or ACLS   In the event of cardiac or respiratory ARREST Use medication by any route, position, wound care, and other measures to relive pain and suffering. May use oxygen, suction and manual treatment of airway obstruction as needed for comfort.      11/22/15 1945    Code Status History    Date Active Date Inactive Code Status Order ID Comments User Context   10/18/2015 11:01 AM 10/18/2015  5:37 PM Full Code [id number]  Algernon Huxley, MD Inpatient   10/16/2015 12:35 PM 10/18/2015 11:01 AM Full Code [id number]  Roxana Hires, MD Inpatient   07/29/2015  6:18 AM 07/31/2015  6:44 PM Full Code [id number]  Nelva Bush, MD Inpatient   12/26/2014 10:34 PM 12/29/2014  6:32 PM Full Code [id number]  Phillips Grout, MD Inpatient   05/06/2012 12:12 AM 05/11/2012  8:32 PM Full Code [id number]  Reeves Dam, RN Inpatient      TOTAL critical CARE TIME TAKING CARE OF THIS PATIENT: 50 minutes.    Discussed this patient's  Wife, Dr Aram Beecham M.D on 11/28/2015 at 2:42  PM  Between 7am to 6pm - Pager - [phone]  After 6pm go to [email or web address] - password [email or web address] [facility]  Office  [phone]  CC: Primary care physician; Pcp Not In System

## 2015-11-28 NOTE — Progress Notes (Signed)
Dr. Ether Griffins called and asked about if she wanted CXR today or tomorrow as the order says tomorrow.  Also MD notified of blood pressure improvement since 25m normal saline bolus and that patient has not voided since midnight.  Bladder scan reveals 11107min bladder.  MD acknowledged all information and verbal order obtained to change chest x-ray to today.

## 2015-11-28 NOTE — Progress Notes (Signed)
Patient ID: [name], male   DOB: 1935/05/17, 81 y.o.   MRN: [id number]  Chief Complaint  Patient presents with  . Hypotension  . Dizziness    Referred By Dr. Ether Griffins  Reason for Referral Left pleural effusion  HPI Location, Quality, Duration, Severity, Timing, Context, Modifying Factors, Associated Signs and Symptoms.  [name] is a 81 y.o. male.  He was admitted to the hospital with dehydration. The patient himself is noncommunicative although he does appear to be awake and follows simple commands. He appears quite withdrawn. According to his wife she noticed that he has been more withdrawn, hypotensive as measured by home blood pressure monitoring and the inability to eat or drink. In addition she states that he's been more short of breath than usual and for all these reasons presented to the emergency department for management. The patient was admitted to the hospital where he underwent a left sided thoracentesis and though the patient is unable to tell me if he improves or not, his wife states that he did feel somewhat better. He is accompanied today by his wife and daughter as well as other family members. He is had chemotherapy in the past which she tolerated very poorly. He is scheduled to undergo additional chemotherapy in the future if he is able to recover sufficiently to tolerate that. At the present time he is quite ill with evidence of dehydration and renal insufficiency.   Past Medical History  Diagnosis Date  . Hypertension   . Coronary artery disease   . Atrial fibrillation ([place]) 07/2015  . Primary cancer of left upper lobe of lung ([place]) 10/06/2015    Past Surgical History  Procedure Laterality Date  . Coronary artery bypass graft  12/10/2005    LIMA to LAD,SVG to diagonal,SVG to ramus intermediate,SVG to distal RCA.  . Tonsillectomy    . Tee without cardioversion  05/10/2012    Procedure: TRANSESOPHAGEAL ECHOCARDIOGRAM (TEE);  Surgeon: [name],  MD;  Location: [facility] ENDOSCOPY;  Service: Cardiovascular;  Laterality: N/A;  . Cardioversion  05/10/2012    Procedure: CARDIOVERSION;  Surgeon: [name], MD;  Location: [facility] ENDOSCOPY;  Service: Cardiovascular;  Laterality: N/A;  . Cardiac catheterization  10/28/2005    significant 3 vessel CAD  . Nm myocar perf wall motion  09/16/2005    evidence of diaphragmatic attenuation & subtle anterolateral ischemia.  . Cardioversion N/A 07/31/2015    Procedure: CARDIOVERSION;  Surgeon: Josue Hector, MD;  Location: [facility] ENDOSCOPY;  Service: Cardiovascular;  Laterality: N/A;  . Peripheral vascular catheterization N/A 10/18/2015    Procedure: Glori Luis Cath Insertion;  Surgeon: Algernon Huxley, MD;  Location: [place] CV LAB;  Service: Cardiovascular;  Laterality: N/A;    Family History  Problem Relation Age of Onset  . Stroke Mother 72  . Stroke Father 57  . Heart attack Father 81  . Heart attack Brother 34  . Cancer Sister 17    Social History Social History  Substance Use Topics  . Smoking status: Former Smoker -- 1.00 packs/day for 40 years    Types: Cigarettes    Quit date: 09/19/2005  . Smokeless tobacco: Never Used  . Alcohol Use: No    Allergies  Allergen Reactions  . Clonidine Derivatives Other (See Comments)    Pt was not aware of a reaction to this medication  . Diltiazem Hcl Er Other (See Comments)    High blood pressure  . Statins Other (See Comments)  Myalgias:  Previously on Crestor,Vytorin,Simvastatin,Lipitor & Pravastatin  . Sulfa Antibiotics Swelling    Current Facility-Administered Medications  Medication Dose Route Frequency Provider Last Rate Last Dose  . acetaminophen (TYLENOL) tablet 650 mg  650 mg Oral Q6H PRN Demetrios Loll, MD   650 mg at 11/27/15 4403   Or  . acetaminophen (TYLENOL) suppository 650 mg  650 mg Rectal Q6H PRN Demetrios Loll, MD      . albuterol (PROVENTIL) (2.5 MG/3ML) 0.083% nebulizer solution 2.5 mg  2.5 mg Nebulization Q2H PRN Demetrios Loll, MD       . aspirin EC tablet 81 mg  81 mg Oral Daily Demetrios Loll, MD   81 mg at 11/28/15 1048  . citalopram (CELEXA) tablet 20 mg  20 mg Oral Daily Forest Gleason, MD   20 mg at 11/28/15 1048  . guaiFENesin (MUCINEX) 12 hr tablet 600 mg  600 mg Oral BID Demetrios Loll, MD   600 mg at 11/28/15 1048  . ipratropium-albuterol (DUONEB) 0.5-2.5 (3) MG/3ML nebulizer solution 3 mL  3 mL Nebulization Q4H PRN Lytle Butte, MD   3 mL at 11/28/15 1011  . LORazepam (ATIVAN) tablet 0.25 mg  0.25 mg Oral BID Demetrios Loll, MD   0.25 mg at 11/28/15 1048  . mirtazapine (REMERON) tablet 15 mg  15 mg Oral QHS Theodoro Grist, MD   15 mg at 11/27/15 2125  . morphine 2 MG/ML injection 2 mg  2 mg Intravenous Q4H PRN Saundra Shelling, MD   2 mg at 11/26/15 0418  . ondansetron (ZOFRAN) tablet 4 mg  4 mg Oral Q6H PRN Demetrios Loll, MD       Or  . ondansetron [facility]) injection 4 mg  4 mg Intravenous Q6H PRN Demetrios Loll, MD   4 mg at 11/27/15 1640  . oxyCODONE-acetaminophen (PERCOCET/ROXICET) 5-325 MG per tablet 1-2 tablet  1-2 tablet Oral Q4H PRN Demetrios Loll, MD   1 tablet at 11/26/15 1631  . tamsulosin (FLOMAX) capsule 0.4 mg  0.4 mg Oral QPC supper Demetrios Loll, MD   0.4 mg at 11/27/15 4742      Review of Systems A complete review of systems was asked and was negative except for the following positive findings shortness of breath, inability to eat or drink.  Blood pressure 99/63, pulse 105, temperature 97.9 F (36.6 C), temperature source Oral, resp. rate 18, height '5\' 6"'$  (1.676 m), weight 177 lb 14.4 oz (80.695 kg), SpO2 95 %.  Physical Exam CONSTITUTIONAL: Critically ill patient with minimal interaction during examination. EYES: Pupils equal and reactive to light, Sclera non-icteric EARS, NOSE, MOUTH AND THROAT:  The oropharynx was clear.  Dentition is very poor LYMPH NODES:  Lymph nodes in the neck and axillae were normal RESPIRATORY:  Lungs were markedly diminished on the left and slightly diminished on the right.  Normal respiratory effort  without pathologic use of accessory muscles of respiration CARDIOVASCULAR: Heart was regular without murmurs.  There were no carotid bruits.   Data Reviewed I have independently reviewed his chest x-rays and CT scans.  I have personally reviewed the patient's imaging, laboratory findings and medical records.    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    Plan    I spent a long time talking to the patient and his family regarding the options. He is scheduled to meet with the palliative care team later today and that certainly is an option as well.  Other options would include repeat thoracentesis and Pleurx catheter insertion. The family does not wish to pursue a Pleurx catheter at this time. Therefore my only recommendation is for repeat thoracentesis on a symptomatic basis.       Nestor Lewandowsky, MD 11/28/2015, 4:05 PM

## 2015-11-28 NOTE — Consult Note (Signed)
Palliative Medicine Inpatient Consult Note   Name: [name] Date: 11/28/2015 MRN: [id number]  DOB: [birth year]  Referring Physician: Theodoro Grist, MD  Palliative Care consult requested for this 81 y.o. male for goals of medical therapy in patient with stage IV lung cancer.   BRIEF HISTORY: Pt is a 81yo man known to have stage IV lung cancer.  He had chemo at the end of last month and then that was stopped and he was switched to immunotherapy infusions due to intolerance of chemo.  He has had poor oral intake as well as dizziness and weakness for 2 wks prior to being admitted. He was also very short of breath.  Blood pressure as 66/45 in the ER and he had a creatinine of 2.5 indicating acute renal failure due to dehydration.    TODAY'S DISCUSSIONS AND DECISIONS: Pt is able to make his own healthcare decisions. He is hard of hearing but I made sure he could hear me.  I spoke to him in the presence of his wife, daughter, and extended family.    Pt understands he does not have long to live.  I mentioned three weeks or less because he certainly could qualify for Hospice Home IF he wanted to go there. He prefers to go home.  I thought that his wife would have preferred Hospice Home because she started to cry when other family members mentioned that he would want to be at home --and she told the rest of the family they were going to have to 'step up' and help her. They all said they would. I explained the pros and cons of Hospice Home and also of Hospice in his own home.  One relative stated, "Well he already has a hospital bed at home.".  But apparently he is not getting active Home Health at this time (I am a bit unclear on this however).    Pt is DNR and this will continue.  Pt wants a thoracentesis in the morning if possible.  I have communicated this to Dr. Norlene Duel. Pt's left sided pleural effusion appears to have completely recurred after only 2 days time.  Dr. Genevive Bi saw pt but pts BP is too  low for him to go through a Pleurx procedure at this time.    Pt says he is at peace and is not worried about anything.  Family members were tearful.  Pt was very matter-of-fact and resigned to dying at home with family around him. He is not expecting any more chemo or immuno-therapy.  He understands his meds will be for comfort. Family also understands that approach will be for comfort.  He needs humidified O2.  Sometimes he can go without O2, but he shouldn't let that go too long for he does start to appear to need it after a while.  Might need to consider foley at some point as he has a very large prostate (seen on ultrasound) possibly causing some mild left hydronephrosis (?).  He is now on Flomax --but when he stops being able to take that pill or has decreased urine output, his bladder may fill up and he may need a foley.    I expect that he will be stable enough for transfer home, but he did get hypotensive after first thoracentesis (might need fluids OR albumin at same time as thoracentesis tomorrow).    I have called Dr. Norlene Duel and updated her as well as his nurse.    Care Mgr to present Choice and  then we will proceed from there.    IMPRESSION: Lung Cancer to left upper lobe of lung  ---diagnosed fall 2016 after he had vocal cord paralysis making speech difficult ---abnl CT scan of chest in Nov 2016 showing irreg 3.2 by 2.5 left upper lobe lung mass  ---biopsy abdominal wall nodule = poorly differentiated Carcinoma c/w lung cancer primary ---PET scan showed multiple bone mets T2 N2 M1 B stage IV disease with mets to bones and abd wall. ---started on Keytruda on Nov 14, 2015 ---seen by Dr. Oliva Bustard on Nov 15, 2015. Dehydration with acute renal failure ---seen in oncology office by Georgeanne Nim NP, 2/2 and was noted to have pain on right side, low BP, and tachycardia etc. Was sent to hospital for adm. Left pleural effusion ---considered to be malignant  Though path  pending-- ---thoracentesis was done 2/6 and they took of 1 liter of serous fluid ---CXR shows fluid has re-accumulated already (per my view of images) ---has been seen by Dr. Genevive Bi regarding possible Pleurx catheter, but this was declined Persistent AFib  (recurrent after cardioversion in Oct 2016) ---has been on chronic coumadin ---now with supratherapeutic INR (coagulopathy) Acute diastolic CHF with echo done in Oct 2016 (EF 60-65%) Malnutrition due to poor oral intake due to lung cancer and resp failure Constipation Dysphagia --MBSS study =rec for dysphagia 2 with Nectar Thick luquids Hyponatremia ---SIADH Hyperkalemia Acute Respiratory Failure due to CHF, pleural effusion, lung cancer, COPD Hypotension --due to fluid shift after thoracentesis treated with fluid bolus Enlarged Prostate (might need to consider foley at some point) CAD ---CABG in 11/2005  Anemia of unclear etiology    REVIEW OF SYSTEMS:  Patient is not able to provide ROS in detail due to difficulty speaking or gesturing secondary to respiratory and physical fatigue with speech (due to vocal cord paralysis and pleural effusion with hypoxemia)  SPIRITUAL SUPPORT SYSTEM: Yes --wife, one daughter and other family members.  SOCIAL HISTORY:  reports that he quit smoking about 10 years ago. His smoking use included Cigarettes. He has a 40 pack-year smoking history. He has never used smokeless tobacco. He reports that he does not drink alcohol or use illicit drugs.  LEGAL DOCUMENTS:  I have placed a portable DNR form in the paper chart.     CODE STATUS: DNR  PAST MEDICAL HISTORY: Past Medical History  Diagnosis Date  . Hypertension   . Coronary artery disease   . Atrial fibrillation ([place]) 07/2015  . Primary cancer of left upper lobe of lung ([place]) 10/06/2015    PAST SURGICAL HISTORY:  Past Surgical History  Procedure Laterality Date  . Coronary artery bypass graft  12/10/2005    LIMA to LAD,SVG to diagonal,SVG to  ramus intermediate,SVG to distal RCA.  . Tonsillectomy    . Tee without cardioversion  05/10/2012    Procedure: TRANSESOPHAGEAL ECHOCARDIOGRAM (TEE);  Surgeon: [name], MD;  Location: [facility] ENDOSCOPY;  Service: Cardiovascular;  Laterality: N/A;  . Cardioversion  05/10/2012    Procedure: CARDIOVERSION;  Surgeon: [name], MD;  Location: [facility] ENDOSCOPY;  Service: Cardiovascular;  Laterality: N/A;  . Cardiac catheterization  10/28/2005    significant 3 vessel CAD  . Nm myocar perf wall motion  09/16/2005    evidence of diaphragmatic attenuation & subtle anterolateral ischemia.  . Cardioversion N/A 07/31/2015    Procedure: CARDIOVERSION;  Surgeon: Josue Hector, MD;  Location: [facility] ENDOSCOPY;  Service: Cardiovascular;  Laterality: N/A;  . Peripheral vascular catheterization N/A 10/18/2015  Procedure: Porta Cath Insertion;  Surgeon: Algernon Huxley, MD;  Location: [place] CV LAB;  Service: Cardiovascular;  Laterality: N/A;    ALLERGIES:  is allergic to clonidine derivatives; diltiazem hcl er; statins; and sulfa antibiotics.  MEDICATIONS:  Current Facility-Administered Medications  Medication Dose Route Frequency Provider Last Rate Last Dose  . acetaminophen (TYLENOL) tablet 650 mg  650 mg Oral Q6H PRN Demetrios Loll, MD   650 mg at 11/27/15 8299   Or  . acetaminophen (TYLENOL) suppository 650 mg  650 mg Rectal Q6H PRN Demetrios Loll, MD      . albuterol (PROVENTIL) (2.5 MG/3ML) 0.083% nebulizer solution 2.5 mg  2.5 mg Nebulization Q2H PRN Demetrios Loll, MD      . aspirin EC tablet 81 mg  81 mg Oral Daily Demetrios Loll, MD   81 mg at 11/28/15 1048  . citalopram (CELEXA) tablet 20 mg  20 mg Oral Daily Forest Gleason, MD   20 mg at 11/28/15 1048  . guaiFENesin (MUCINEX) 12 hr tablet 600 mg  600 mg Oral BID Demetrios Loll, MD   600 mg at 11/28/15 1048  . ipratropium-albuterol (DUONEB) 0.5-2.5 (3) MG/3ML nebulizer solution 3 mL  3 mL Nebulization Q4H PRN Lytle Butte, MD   3 mL at 11/28/15 1011  . LORazepam  (ATIVAN) tablet 0.25 mg  0.25 mg Oral BID Demetrios Loll, MD   0.25 mg at 11/28/15 1048  . mirtazapine (REMERON) tablet 15 mg  15 mg Oral QHS Theodoro Grist, MD   15 mg at 11/27/15 2125  . morphine 2 MG/ML injection 2 mg  2 mg Intravenous Q4H PRN Saundra Shelling, MD   2 mg at 11/26/15 0418  . ondansetron (ZOFRAN) tablet 4 mg  4 mg Oral Q6H PRN Demetrios Loll, MD       Or  . ondansetron [facility]) injection 4 mg  4 mg Intravenous Q6H PRN Demetrios Loll, MD   4 mg at 11/27/15 1640  . oxyCODONE-acetaminophen (PERCOCET/ROXICET) 5-325 MG per tablet 1-2 tablet  1-2 tablet Oral Q4H PRN Demetrios Loll, MD   1 tablet at 11/26/15 1631  . tamsulosin (FLOMAX) capsule 0.4 mg  0.4 mg Oral QPC supper Demetrios Loll, MD   0.4 mg at 11/27/15 1855    Vital Signs: BP 99/63 mmHg  Pulse 105  Temp(Src) 97.9 F (36.6 C) (Oral)  Resp 18  Ht '5\' 6"'$  (1.676 m)  Wt 80.695 kg (177 lb 14.4 oz)  BMI 28.73 kg/m2  SpO2 95% Filed Weights   11/26/15 2300 11/27/15 0454 11/28/15 0402  Weight: 80.196 kg (176 lb 12.8 oz) 81.602 kg (179 lb 14.4 oz) 80.695 kg (177 lb 14.4 oz)    Estimated body mass index is 28.73 kg/(m^2) as calculated from the following:   Height as of this encounter: '5\' 6"'$  (1.676 m).   Weight as of this encounter: 80.695 kg (177 lb 14.4 oz).  PERFORMANCE STATUS (ECOG) : 4 - Bedbound  PHYSICAL EXAM: Up in bedside chair with O2 off  --slight access muscle use noted toward the end of my visit [place] and fully oriented Able to speak one syllable at a time (due to vocal cord paralysis making speech a big effort) Op clear No JVD or TM Hrt rrr with irreg beats Lungs --decreased sounds on left lung except apex and slightly decreased BS on right Abd soft and NT Ext no mottling or cyanosis as yet   LABS: CBC:    Component Value Date/Time   WBC 8.6 11/27/2015  0609   HGB 11.2* 11/27/2015 0609   HCT 34.4* 11/27/2015 0609   PLT 227 11/27/2015 0609   MCV 81.7 11/27/2015 0609   NEUTROABS 7.5* 11/27/2015 0609   LYMPHSABS 0.6*  11/27/2015 0609   MONOABS 0.4 11/27/2015 0609   EOSABS 0.0 11/27/2015 0609   BASOSABS 0.1 11/27/2015 0609   Comprehensive Metabolic Panel:    Component Value Date/Time   NA 137 11/28/2015 0423   K 3.5 11/28/2015 0423   CL 106 11/28/2015 0423   CO2 19* 11/28/2015 0423   BUN 43* 11/28/2015 0423   CREATININE 1.97* 11/28/2015 0423   GLUCOSE 113* 11/28/2015 0423   CALCIUM 7.2* 11/28/2015 0423   AST 23 11/27/2015 0609   ALT 25 11/27/2015 0609   ALKPHOS 92 11/27/2015 0609   BILITOT 0.5 11/27/2015 0609   PROT 5.6* 11/27/2015 0609   ALBUMIN 2.2* 11/27/2015 0609     More than 50% of the visit was spent in counseling/coordination of care: Yes  Time Spent:  80 minutes

## 2015-11-28 NOTE — Plan of Care (Signed)
Problem: SLP Dysphagia Goals Goal: Misc Dysphagia Goal Pt will safely tolerate po diet of least restrictive consistency w/ no overt s/s of aspiration noted by Staff/pt/family x3 sessions.    

## 2015-11-28 NOTE — Care Management (Signed)
Admitted to [facility] with the diagnosis of acute/chronic renal failure. Lives with wife, Katy Apo [phone]). Seen Dr. Jeb Levering last Thursday. Chemotherapy is by mouth every 3 weeks. No home Health. No skilled facility. No home oxygen. Cane and rolling walker in the home, if needed. Takes care of all basic activities of daily living himself. Mr. Tallman has driver's license, but doesn't drive. Last fall was a while ago per Ms. Quentin Ore. Decreased appetite. Speech evaluation completed. Recommends Dysphagia II diet.  Family will transport. Physical therapy evaluation completed. Recommends home with home health 24/7. Discussed home health agencies with Ms. Quentin Ore. Chose [facility]. Will update Marylee Floras, RN representative for [facility] updated. Shelbie Ammons RN MSN CCM Care Management [phone]

## 2015-11-28 NOTE — Clinical Social Work Note (Signed)
CSW received consult for New SNF. CSW discussed pt with RNCM, RN, and Agricultural consultant during progression. PT is recommending HHPT. RNCM is following for discharge planning needs. Pt will discharge home with [facility]. CSW is signing off as no further needs identified.   [name], MSW, LCSW Clinical Social Worker  [phone]

## 2015-11-28 NOTE — Progress Notes (Signed)
Patient hypotensive with complaints of dizziness when standing.  Patient also with rhonchi bilateral with increased productive cough.  Patient placed back in bed and Dr. Ether Griffins notified of changes.  Order obtained. Nurse will continue to monitor closely.

## 2015-11-29 ENCOUNTER — Inpatient Hospital Stay: Payer: Medicare Other

## 2015-11-29 DIAGNOSIS — N179 Acute kidney failure, unspecified: Secondary | ICD-10-CM | POA: Insufficient documentation

## 2015-11-29 LAB — CYTOLOGY - NON PAP

## 2015-11-29 MED ORDER — SODIUM CHLORIDE 0.9 % IV BOLUS (SEPSIS)
500.0000 mL | Freq: Once | INTRAVENOUS | Status: AC | PRN
  Administered 2015-11-29: 500 mL via INTRAVENOUS

## 2015-11-29 MED ORDER — HEPARIN SOD (PORK) LOCK FLUSH 100 UNIT/ML IV SOLN
500.0000 [IU] | Freq: Once | INTRAVENOUS | Status: AC
  Administered 2015-11-29: 13:00:00 500 [IU] via INTRAVENOUS
  Filled 2015-11-29: qty 5

## 2015-11-29 NOTE — Progress Notes (Signed)
Palliative Medicine Inpatient Consult Follow Up Note   Name: [name] Date: 11/29/2015 MRN: [id number]  DOB: August 05, 1935  Referring Physician: No att. providers found  Palliative Care consult requested for this 81 y.o. male for goals of medical therapy in patient with   Stage IV lung cancer as well as acute on chronic renal failure and a quickly recurrent left pleural effusion.    TODAY'S DISCUSSIONS AND DECISIONS: I did not discuss all that was discussed last night when I met with pt and entire family. But, last pm, pt indicated he was finished with getting any and all treatments for his lung cancer. He requested Hospice care in the home setting.  Today, he is noted to have some s/s of air hunger --despite the fact that he has again had a thoracentesis to remove the fluid which quickly re-accumulated.  It is presumed to a be a malignant effusion.  Family is taking him home w/o O2 as pt seems to think he is OK without it, but he is at some risk of decompensating. Nonetheless, family and pt are determined to take him home in their vehicle.  Pt will have a same day admission with Hospice.  I had paged Dr. Oliva Bustard mid-morning but did not get to talk with him until later in the day, after pt had already left.  He was concerned as pt seemed to indicate that he would want to continue with the [place] when Dr. Oliva Bustard visited pt early today.   Pt had actually told me he was 'finished with all treatments'.  But, if he is changing his mind, then there is nothing that would prevent pt from going to see Dr Oliva Bustard and even getting a palliative treatment like Keytruda.    I will call pt/ family and will talk again with Dr. Oliva Bustard to clarfiy. Yesterday, pts wife was asking about Hospice Home --they all seem resigned to pt dying in the next several weeks.  Pt is the one who wanted 'to die in his own home' so this was selected instead.  Pt will probably have a quick recurrence of the pleural  effusion --but unfortunately, he is not a good candidate for a Pleurx due to pts relative hypotension.  This could change, however.     IMPRESSION: Lung Cancer to left upper lobe of lung  ---diagnosed fall 2016 after he had vocal cord paralysis making speech difficult ---abnl CT scan of chest in Nov 2016 showing irreg 3.2 by 2.5 left upper lobe lung mass  ---biopsy abdominal wall nodule = poorly differentiated Carcinoma c/w lung cancer primary ---PET scan showed multiple bone mets T2 N2 M1 B stage IV disease with mets to bones and abd wall. ---started on Keytruda on Nov 14, 2015 ---seen by Dr. Oliva Bustard on Nov 15, 2015. Dehydration with acute renal failure ---seen in oncology office by Georgeanne Nim NP, 2/2 and was noted to have pain on right side, low BP, and tachycardia etc. Was sent to hospital for adm. Left pleural effusion ---considered to be malignant Though path pending-- ---thoracentesis was done 2/6 and they took of 1 liter of serous fluid ---CXR shows fluid has re-accumulated already (per my view of images) ---has been seen by Dr. Genevive Bi regarding possible Pleurx catheter, but this was declined Persistent AFib (recurrent after cardioversion in Oct 2016) ---has been on chronic coumadin ---now with supratherapeutic INR (coagulopathy) Acute diastolic CHF with echo done in Oct 2016 (EF 60-65%) Malnutrition due to poor oral intake due to lung  cancer and resp failure Constipation Dysphagia --MBSS study =rec for dysphagia 2 with Nectar Thick luquids Hyponatremia ---SIADH Hyperkalemia Acute Respiratory Failure due to CHF, pleural effusion, lung cancer, COPD Hypotension --due to fluid shift after thoracentesis treated with fluid bolus Enlarged Prostate (might need to consider foley at some point) CAD ---CABG in 11/2005  Anemia of unclear etiology    CODE STATUS: DNR   PAST MEDICAL HISTORY: Past Medical History  Diagnosis Date  . Hypertension   . Coronary artery disease   .  Atrial fibrillation ([place]) 07/2015  . Primary cancer of left upper lobe of lung ([place]) 10/06/2015    PAST SURGICAL HISTORY:  Past Surgical History  Procedure Laterality Date  . Coronary artery bypass graft  12/10/2005    LIMA to LAD,SVG to diagonal,SVG to ramus intermediate,SVG to distal RCA.  . Tonsillectomy    . Tee without cardioversion  05/10/2012    Procedure: TRANSESOPHAGEAL ECHOCARDIOGRAM (TEE);  Surgeon: [name], MD;  Location: [facility] ENDOSCOPY;  Service: Cardiovascular;  Laterality: N/A;  . Cardioversion  05/10/2012    Procedure: CARDIOVERSION;  Surgeon: [name], MD;  Location: [facility] ENDOSCOPY;  Service: Cardiovascular;  Laterality: N/A;  . Cardiac catheterization  10/28/2005    significant 3 vessel CAD  . Nm myocar perf wall motion  09/16/2005    evidence of diaphragmatic attenuation & subtle anterolateral ischemia.  . Cardioversion N/A 07/31/2015    Procedure: CARDIOVERSION;  Surgeon: Josue Hector, MD;  Location: [facility] ENDOSCOPY;  Service: Cardiovascular;  Laterality: N/A;  . Peripheral vascular catheterization N/A 10/18/2015    Procedure: Glori Luis Cath Insertion;  Surgeon: Algernon Huxley, MD;  Location: [facility] CV LAB;  Service: Cardiovascular;  Laterality: N/A;    Vital Signs: BP 96/59 mmHg  Pulse 111  Temp(Src) 97.5 F (36.4 C) (Oral)  Resp 18  Ht '5\' 6"'$  (1.676 m)  Wt 80.695 kg (177 lb 14.4 oz)  BMI 28.73 kg/m2  SpO2 99% Filed Weights   11/26/15 2300 11/27/15 0454 11/28/15 0402  Weight: 80.196 kg (176 lb 12.8 oz) 81.602 kg (179 lb 14.4 oz) 80.695 kg (177 lb 14.4 oz)    Estimated body mass index is 28.73 kg/(m^2) as calculated from the following:   Height as of this encounter: '5\' 6"'$  (1.676 m).   Weight as of this encounter: 80.695 kg (177 lb 14.4 oz).  PHYSICAL EXAM: Pt has some accessory muscle use  EOMI Skin w/o cyanosis or mottling   LABS: CBC:    Component Value Date/Time   WBC 8.6 11/27/2015 0609   HGB 11.2* 11/27/2015 0609   HCT 34.4*  11/27/2015 0609   PLT 227 11/27/2015 0609   MCV 81.7 11/27/2015 0609   NEUTROABS 7.5* 11/27/2015 0609   LYMPHSABS 0.6* 11/27/2015 0609   MONOABS 0.4 11/27/2015 0609   EOSABS 0.0 11/27/2015 0609   BASOSABS 0.1 11/27/2015 0609   Comprehensive Metabolic Panel:    Component Value Date/Time   NA 137 11/28/2015 0423   K 3.5 11/28/2015 0423   CL 106 11/28/2015 0423   CO2 19* 11/28/2015 0423   BUN 43* 11/28/2015 0423   CREATININE 1.97* 11/28/2015 0423   GLUCOSE 113* 11/28/2015 0423   CALCIUM 7.2* 11/28/2015 0423   AST 23 11/27/2015 0609   ALT 25 11/27/2015 0609   ALKPHOS 92 11/27/2015 0609   BILITOT 0.5 11/27/2015 0609   PROT 5.6* 11/27/2015 0609   ALBUMIN 2.2* 11/27/2015 0609    More than 50% of the visit was spent in counseling/coordination  of care: YES  Time Spent: 25 min

## 2015-11-29 NOTE — Progress Notes (Signed)
PT Cancellation Note  Patient Details Name: [name] MRN: [id number] DOB: [birth year]   Cancelled Treatment:     Order cancellation received. Will complete order. Please enter new order if status or needs change.  Lyndel Safe Huprich PT, DPT   Huprich,Jason 11/29/2015, 8:41 AM

## 2015-11-29 NOTE — Progress Notes (Signed)
New referral for [facility] services at home following discharge. Mr. Guajardo is 17 year man with stage IV metastatic lung cancer admitted to [facility] on 11/22/15 for evaluation of dizziness and treatment of acute renal failure as well as acute respiratory failure. Chest xray revealed left pleural effusion, for which he has under gone 2 thoracenteses, this is believed to be a malignant pleural effusion. Family and patient met with Palliative Care physician Dr. Megan Salon and have chosen to forgo any further treatment and return home with hospice services, focusing on comfort and symptom management.  Writer met in the patient's room with his wife Katy Apo, grandson Gerald Stabs and brother Patrick Jupiter, patient was not in the room at that time as he was down having a thoracentesis. Writer initiated education regarding hospice services, philosophy and team approach to care with good understanding voiced. Patient will require oxygen to be delivered to his home prior to discharge.  Patient information faxed to referral intake. Oxygen ordered to be delivered as soon as possible, Eddie Dibbles to be contact for [facility]. Family plans to take him home via car and feel that he can tolerate the ride with out oxygen. Hospital Care team made aware of and are in agreement with discharge plan. Sign DNR and prescriptions in place in patient's hospital chart. Thank you for the opportunity to be involved in the care of this patient and his family. Flo Shanks RN, BSN, [place] [facility] [place], [facility] [phone] c

## 2015-11-29 NOTE — Progress Notes (Signed)
Speech Language Pathology Treatment: Dysphagia  Patient Details Name: [name] MRN: [id number] DOB: [birth year] Today's Date: 11/29/2015 Time: 0900-1000 SLP Time Calculation (min) (ACUTE ONLY): 60 min  Assessment / Plan / Recommendation Clinical Impression  Pt continues to present w/ increased risk for aspiration sec. To baseline dysphagia dx'd during MBSS 11/27/15, and sec. To declined medical and respiratory status. Pt is quick to fatigue w/ any exertion and is mostly resting w/ eyes closed. Family members present including wife and brother. Thorough education was given on results of MBSS, dysphagia, dysphagia diet consistency and liquid consistency rec'd, preparation of diet and liquid consistency; ordering information and handouts on aspiration precautions and above recs. Wife and brother were able to verbalize appropriate examples of foods and drinks as well as preparation instructions given. Handouts provided. Pt is d/t discharge home today w/ [facility] services. Family will f/u w/ such for any further ST services as indicated. Wife and brother appreciative of today's session and supplies provided.   HPI HPI: [name] is a 81 y.o. male with a known history of hypertension, coronary artery disease, atrial fibrillation on Coumadin, stage IV lung cancer with metastasis. The patient got chemotherapy at the end of last month.he has had poor oral intake, dizziness and generalized weakness for the past the 2 weeks. He went to Dr. Metro Kung office and was found to have low blood pressure at 80s. He was sent to the ED for further evaluation. HE ALSO COMPLAINS OF SHORTNESS OF BREATH. BUT HE DENIES ANY FEVER OR CHILLS. his blood pressure in the ED was 66/45 and he was found to have acute renal failure with creatinine increased to more than 2.5. He was treated with normal saline bolus. The patient presented with initial discomfort in the chest. He then developed a dry hacking cough and hoarseness. He was  seen by Dr. Pryor Ochoa of ENT and discovered to have left vocal cord paralysis. Chest CT in 08/2015 revealed a 3.2 x 2.5 cm left upper lobe lung mass. There was ipsilateral peribronchial and ipsilateral hilar lymphadenopathy.PET scan revealed multiple bone metastases. Biopsy of an abdominal wall nodule on 09/25/2015 revealed poorly differentiated carcinoma(CK 7 positive, TTF negative) clinically consistent with lung primary.Pt was seen by ST services for a BSE in which the diet consistency was modified. Pt continues to exhibit soft s/s of aspiration; family endorsed pt has exhibited similar at home(mild throat clearing, cough) w/ po's. Pt appeared weak and was not interested in much po's; concerned for N/V. Pt exhibits poor/reduced respiratory support for speech(baseline); suspect related to vocal cord paralysis and lung Ca. MBSS revealed oropharyngeal phase dysphagia w/ delayed pharyngeal swallow w/ resulting aspiration of thin liquids. Pt was better able to tolerate/manage Nectar consistency liquids following aspiration precautions. Pt presents w/ increased weakness today; drowsy. Family members present.       SLP Plan  Continue with current plan of care     Recommendations  Diet recommendations: Dysphagia 2 (fine chop);Nectar-thick liquid Liquids provided via: Cup Medication Administration: Crushed with puree Supervision: Patient able to self feed;Full supervision/cueing for compensatory strategies Compensations: Minimize environmental distractions;Small sips/bites;Slow rate;Follow solids with liquid;Multiple dry swallows after each bite/sip;Lingual sweep for clearance of pocketing Postural Changes and/or Swallow Maneuvers: Seated upright 90 degrees             Oral Care Recommendations: Oral care BID;Staff/trained caregiver to provide oral care Follow up Recommendations:  (TBD pt d/c'ing home) Plan: Continue with current plan of care     GO  Orinda Kenner, MS,  CCC-SLP  [name],[name] 11/29/2015, 1:14 PM

## 2015-11-29 NOTE — Care Management Important Message (Signed)
Important Message  Patient Details  Name: [name] MRN: [id number] Date of Birth: 1935/03/11   Medicare Important Message Given:  Yes    Juliann Pulse A Lautaro Koral 11/29/2015, 10:29 AM

## 2015-11-29 NOTE — Discharge Summary (Signed)
[place] [facility] [place] NAME: [name]    MR#:  [id number]  DATE OF BIRTH:  [birth year]  DATE OF ADMISSION:  11/22/2015 ADMITTING PHYSICIAN: Demetrios Loll, MD  DATE OF DISCHARGE: 12/09/2015  PRIMARY CARE PHYSICIAN: Pcp Not In System    ADMISSION DIAGNOSIS:  Hypotension, unspecified [I95.9] Weakness [R53.1] SOB (shortness of breath) [R06.02] Acute renal failure, unspecified acute renal failure type (HCC) [N17.9] Congestive heart failure, unspecified congestive heart failure chronicity, unspecified congestive heart failure type ([place]) [I50.9]  DISCHARGE DIAGNOSIS:  Active Problems:   ARF (acute renal failure) (HCC)   Hypotension   Supratherapeutic INR   Dyspnea   SECONDARY DIAGNOSIS:   Past Medical History  Diagnosis Date  . Hypertension   . Coronary artery disease   . Atrial fibrillation ([place]) 07/2015  . Primary cancer of left upper lobe of lung ([place]) 10/06/2015    HOSPITAL COURSE:   81 year old male with past medical history significant for stage IV lung cancer who is currently on immunotherapy due to intolerance of chemotherapy, hypertension, atrial fibrillation, coronary artery disease was brought in secondary to difficulty breathing and also hypotension. Started to be in acute renal failure as well.  #1 acute on chronic respiratory distress-secondary to worsening of left pleural effusion. Recently thoracentesis was done about a week ago with worsening now again. -Seen by oncologist with rapid return of his malignancy, inability to tolerate chemotherapy family has requested palliative care medicine and after discussion patient will be discharged home with hospice services. -He will have a repeat left-sided thoracentesis prior to discharge. -Home oxygen needs to be set up at this time as patient is here requiring up to 2 L of oxygen.  #2 acute on chronic renal failure-initially thought to be ATN and received IV fluids have  of her that resulted in pulmonary congestion and difficulty breathing. Diuresis was held today and now because he is on comfort care, no repeat labs are being done.  #3 stage IV metastatic lung cancer-with failure to thrive. Encourage eating and ambulation as tolerated. -Follows with Dr. Oliva Bustard as outpatient  #4 dysphagia-seen by speech therapist and placed on nectar thick liquids with dysphagia 1 diet.  #5 hypotension-fluids as needed while in the hospital  Patient is a DO NOT RESUSCITATE and will be discharged home with hospice services today. Most of his medications are stopped and he will be on mostly on morphine and Ativan and some comfort medicines  DISCHARGE CONDITIONS:   Critical  CONSULTS OBTAINED:  Treatment Team:  Forest Gleason, MD Allyne Gee, MD  DRUG ALLERGIES:   Allergies  Allergen Reactions  . Clonidine Derivatives Other (See Comments)    Pt was not aware of a reaction to this medication  . Diltiazem Hcl Er Other (See Comments)    High blood pressure  . Statins Other (See Comments)    Myalgias:  Previously on Crestor,Vytorin,Simvastatin,Lipitor & Pravastatin  . Sulfa Antibiotics Swelling    DISCHARGE MEDICATIONS:   Current Discharge Medication List    START taking these medications   Details  bisacodyl (DULCOLAX) 10 MG suppository Place 1 suppository (10 mg total) rectally daily as needed for moderate constipation. Qty: 6 suppository, Refills: 0    glycopyrrolate (ROBINUL) 1 MG tablet Take 1 tablet (1 mg total) by mouth 3 (three) times daily as needed (secretions). Qty: 15 tablet, Refills: 0    guaiFENesin (ROBITUSSIN) 100 MG/5ML SOLN Take 5 mLs (100 mg total) by mouth every 4 (four) hours  as needed for cough or to loosen phlegm. Qty: 236 mL, Refills: 0    Morphine Sulfate (MORPHINE CONCENTRATE) 10 MG/0.5ML SOLN concentrated solution Take 0.25 mLs (5 mg total) by mouth every 2 (two) hours as needed for moderate pain, severe pain or shortness of  breath. Qty: 30 mL, Refills: 0    prochlorperazine (COMPAZINE) 25 MG suppository Place 1 suppository (25 mg total) rectally every 8 (eight) hours as needed for nausea or vomiting. Qty: 6 suppository, Refills: 0      CONTINUE these medications which have CHANGED   Details  ipratropium-albuterol (DUONEB) 0.5-2.5 (3) MG/3ML SOLN Take 3 mLs by nebulization every 4 (four) hours as needed (for shortness of breath). Qty: 180 mL, Refills: 0   Associated Diagnoses: Primary cancer of left upper lobe of lung ([place]); Atrial fibrillation with tachycardic ventricular rate ([place])    !! LORazepam (ATIVAN) 0.5 MG tablet Take 1 tablet (0.5 mg total) by mouth every 4 (four) hours as needed for anxiety. Qty: 30 tablet, Refills: 0    !! LORazepam (ATIVAN) 0.5 MG tablet Take 1 tablet (0.5 mg total) by mouth every 4 (four) hours as needed for anxiety. Qty: 15 tablet, Refills: 0     !! - Potential duplicate medications found. Please discuss with provider.    CONTINUE these medications which have NOT CHANGED   Details  Tamsulosin HCl (FLOMAX) 0.4 MG CAPS Take 0.4 mg by mouth daily after supper.      STOP taking these medications     aspirin EC 81 MG tablet      guaiFENesin (MUCINEX) 600 MG 12 hr tablet      HYDROcodone-acetaminophen (NORCO/VICODIN) 5-325 MG tablet      lidocaine-prilocaine (EMLA) cream      lisinopril (PRINIVIL,ZESTRIL) 10 MG tablet      metoprolol succinate (TOPROL-XL) 50 MG 24 hr tablet      mirtazapine (REMERON) 15 MG tablet      ondansetron (ZOFRAN) 4 MG tablet      oxyCODONE-acetaminophen (PERCOCET/ROXICET) 5-325 MG tablet      warfarin (COUMADIN) 5 MG tablet      fentaNYL (DURAGESIC - DOSED MCG/HR) 12 MCG/HR      omeprazole (PRILOSEC) 20 MG capsule      predniSONE (DELTASONE) 20 MG tablet          DISCHARGE INSTRUCTIONS:   1. Patient will be discharged home with hospice services  If you experience worsening of your admission symptoms, develop shortness of  breath, life threatening emergency, suicidal or homicidal thoughts you must seek medical attention immediately by calling 911 or calling your MD immediately  if symptoms less severe.  You Must read complete instructions/literature along with all the possible adverse reactions/side effects for all the Medicines you take and that have been prescribed to you. Take any new Medicines after you have completely understood and accept all the possible adverse reactions/side effects.   Please note  You were cared for by a hospitalist during your hospital stay. If you have any questions about your discharge medications or the care you received while you were in the hospital after you are discharged, you can call the unit and asked to speak with the hospitalist on call if the hospitalist that took care of you is not available. Once you are discharged, your primary care physician will handle any further medical issues. Please note that NO REFILLS for any discharge medications will be authorized once you are discharged, as it is imperative that you return to your  primary care physician (or establish a relationship with a primary care physician if you do not have one) for your aftercare needs so that they can reassess your need for medications and monitor your lab values.    Today   CHIEF COMPLAINT:   Chief Complaint  Patient presents with  . Hypotension  . Dizziness    VITAL SIGNS:  Blood pressure 96/59, pulse 111, temperature 97.5 F (36.4 C), temperature source Oral, resp. rate 18, height '5\' 6"'$  (1.676 m), weight 80.695 kg (177 lb 14.4 oz), SpO2 99 %.  I/O:   Intake/Output Summary (Last 24 hours) at 11/29/15 1020 Last data filed at 11/29/15 0900  Gross per 24 hour  Intake    120 ml  Output      0 ml  Net    120 ml    PHYSICAL EXAMINATION:   Physical Exam  GENERAL:  80 y.o.-year-old patient sitting on the side of the bed with mild respiratory distress.  EYES: Pupils equal, round, reactive to  light and accommodation. No scleral icterus. Extraocular muscles intact.  HEENT: Head atraumatic, normocephalic. Oropharynx and nasopharynx clear.  NECK:  Supple, no jugular venous distention. No thyroid enlargement, no tenderness.  LUNGS: Coarse breath sounds mostly at the bases. Scattered wheezing decreased breath sounds at the left base. No use of accessory muscles to breathe. CARDIOVASCULAR: S1, S2 normal. No murmurs, rubs, or gallops.  ABDOMEN: Soft, non-tender, non-distended. Bowel sounds present. No organomegaly or mass.  EXTREMITIES: No pedal edema, cyanosis, or clubbing.  NEUROLOGIC: Cranial nerves II through XII are intact. Muscle strength 5/5 in all extremities. Sensation intact. Gait not checked.  PSYCHIATRIC: The patient is alert and oriented x 3.  SKIN: No obvious rash, lesion, or ulcer.   DATA REVIEW:   CBC  Recent Labs Lab 11/27/15 0609  WBC 8.6  HGB 11.2*  HCT 34.4*  PLT 227    Chemistries   Recent Labs Lab 11/22/15 1638  11/27/15 0609 11/28/15 0423  NA 133*  < > 140 137  K 4.7  < > 3.9 3.5  CL 104  < > 110 106  CO2 19*  < > 22 19*  GLUCOSE 104*  < > 108* 113*  BUN 41*  < > 41* 43*  CREATININE 2.50*  < > 1.99* 1.97*  CALCIUM 7.7*  < > 7.2* 7.2*  MG 2.0  --   --   --   AST 26  < > 23  --   ALT 29  < > 25  --   ALKPHOS 103  < > 92  --   BILITOT 0.7  < > 0.5  --   < > = values in this interval not displayed.  Cardiac Enzymes  Recent Labs Lab 11/22/15 1638  TROPONINI <0.03    Microbiology Results  Results for orders placed or performed during the hospital encounter of 11/22/15  Body fluid culture     Status: None (Preliminary result)   Collection Time: 11/26/15  2:22 PM  Result Value Ref Range Status   Specimen Description PLEURAL  Final   Special Requests NONE  Final   Gram Stain   Final    RARE RED BLOOD CELLS NO WBC SEEN NO ORGANISMS SEEN    Culture NO GROWTH 2 DAYS  Final   Report Status PENDING  Incomplete    RADIOLOGY:  US  Renal  11/27/2015  CLINICAL DATA:  Patient with acute renal failure. EXAM: RENAL / URINARY TRACT ULTRASOUND COMPLETE COMPARISON:  CT abdomen pelvis 12/26/2014 FINDINGS: Right Kidney: Length: 10.1 cm. Normal renal cortical thickness and echogenicity. No hydronephrosis. There is a 2.0 x 2.3 x 2.4 cm cyst within the inferior pole. There is an indeterminate sub cm hypoechoic lesion within the superior pole. Left Kidney: Length: 13.0 cm. Normal renal cortical thickness and echogenicity. Mild hydronephrosis. Bladder: The prostate is enlarged. IMPRESSION: Mild left-sided hydronephrosis. Electronically Signed   By: Lovey Newcomer M.D.   On: 11/27/2015 15:48   Dg Chest Port 1 View  11/28/2015  CLINICAL DATA:  Hypotension; known left-sided pleural effusion, chronic renal failure, stage IV lung malignancy. EXAM: PORTABLE CHEST 1 VIEW COMPARISON:  Portable chest x-ray of November 26, 2015 FINDINGS: The right lung is reasonably well inflated. The interstitial markings are coarse though stable. On the left volume loss has increased with a pleural effusion occupying approximately 1/2 of the pleural space volume. The interstitial markings in the aerated upper lobe are increased. The cardiac silhouette remains enlarged. The central pulmonary vascularity is mildly engorged and is indistinct. The sternal wires are intact. The Port-A-Cath appliance tip projects over the midportion of the SVC. IMPRESSION: Interval increase in the volume of pleural fluid on the left. Stable cardiomegaly. Mild pulmonary vascular congestion predominantly on the left more conspicuous than on the previous study. Electronically Signed   By: David  Martinique M.D.   On: 11/28/2015 13:36    EKG:   Orders placed or performed during the hospital encounter of 11/22/15  . ED EKG  . ED EKG      Management plans discussed with the patient, family and they are in agreement.  CODE STATUS:     Code Status Orders        Start     Ordered   11/22/15 1946   Do not attempt resuscitation (DNR)   Continuous    Question Answer Comment  In the event of cardiac or respiratory ARREST Do not call a "code blue"   In the event of cardiac or respiratory ARREST Do not perform Intubation, CPR, defibrillation or ACLS   In the event of cardiac or respiratory ARREST Use medication by any route, position, wound care, and other measures to relive pain and suffering. May use oxygen, suction and manual treatment of airway obstruction as needed for comfort.      11/22/15 1945    Code Status History    Date Active Date Inactive Code Status Order ID Comments User Context   10/18/2015 11:01 AM 10/18/2015  5:37 PM Full Code [id number]  Algernon Huxley, MD Inpatient   10/16/2015 12:35 PM 10/18/2015 11:01 AM Full Code [id number]  Roxana Hires, MD Inpatient   07/29/2015  6:18 AM 07/31/2015  6:44 PM Full Code [id number]  Nelva Bush, MD Inpatient   12/26/2014 10:34 PM 12/29/2014  6:32 PM Full Code [id number]  Phillips Grout, MD Inpatient   05/06/2012 12:12 AM 05/11/2012  8:32 PM Full Code [id number]  Reeves Dam, RN Inpatient      TOTAL TIME TAKING CARE OF THIS PATIENT: 37 minutes.    Melchor Kirchgessner M.D on 11/29/2015 at 10:20 AM  Between 7am to 6pm - Pager - [phone]  After 6pm go to [email or web address] - password [email or web address] [facility]  Office  [phone]  CC: Primary care physician; Pcp Not In System

## 2015-11-29 NOTE — Plan of Care (Signed)
Pt d/ced home w/hospice - Pt A&O, ambulatory.  Has dyspnea at rest - on O2.  O2 will be waiting for them at home.  Roxanol has helped w/air hunger and gave that to him before he left. Reviewed scripts and de-acessed port after flushing w/heparin.  Strongly emphasized to wife that she isn't alone.  Hospice will be with her just as much as they will be present for her husband.  Pt's brother very supportive.  Pt wheeled out by auxillary and going home w/brother.

## 2015-11-29 NOTE — Discharge Instructions (Signed)
Diet: Dysphagia with nectar thick liquids Needs assistance with feeding NO Straws; add gravy to all meats and potatoes; likes puddings. Yogurt TID meals.

## 2015-11-29 NOTE — Care Management (Signed)
Spoke with grandson, Gerald Stabs, at the bedside. Discussed Hospice Home agencies. Yolanda Bonine stated that his family would like [facility]. Marylee Floras, RN representative for [employer] updated.                                                                                                                                      Possible discharge to home today per Dr. Tressia Miners. Shelbie Ammons RN MSN CCM Care Management [phone]

## 2015-11-30 LAB — BODY FLUID CULTURE: Culture: NO GROWTH

## 2015-12-06 ENCOUNTER — Inpatient Hospital Stay: Payer: Medicare Other | Admitting: Oncology

## 2015-12-06 ENCOUNTER — Inpatient Hospital Stay: Payer: Medicare Other

## 2015-12-07 ENCOUNTER — Telehealth: Payer: Self-pay | Admitting: *Deleted

## 2015-12-19 NOTE — Telephone Encounter (Signed)
Crystal from Hospice called to report that patient expired today 12/30/2015 @ 10:30 AM

## 2015-12-19 DEATH — deceased

## 2016-01-22 IMAGING — CT NM PET TUM IMG INITIAL (PI) SKULL BASE T - THIGH
1 of 10 series · 1 of 25 positions shown · non-contrast
Comparison: Chest CT 09/14/2015. CT the abdomen and pelvis
12/27/2014. Neck CT 09/14/2015.

CLINICAL DATA: Initial treatment strategy for lung mass.

EXAM:
NUCLEAR MEDICINE PET SKULL BASE TO THIGH
TECHNIQUE: 12.7 mCi F-18 FDG was injected intravenously. Full-ring PET imaging
was performed from the skull base to thigh after the radiotracer. CT
data was obtained and used for attenuation correction and anatomic
localization.
FASTING BLOOD GLUCOSE:  Value: 118 mg/dl

[Series 4: ct wb 5.0 b30f · axial · 5.0mm · 0.98mm/px · 1 of 290 slices shown]
[im 290/290  brain]
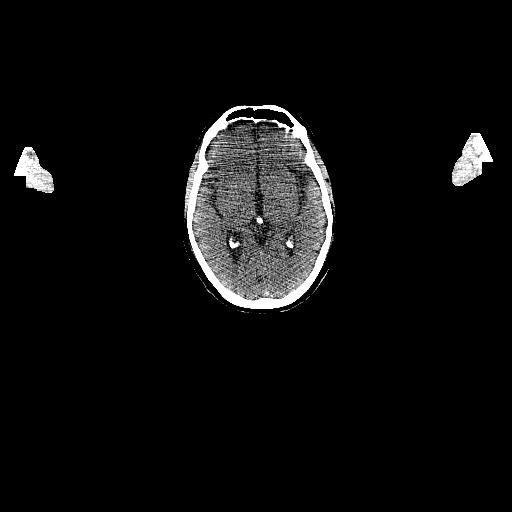

[1 of 25 positions shown; findings below may reference images not displayed]

FINDINGS: NECK

No hypermetabolic lymph nodes in the neck.

CHEST

[DATE] x 2.8 cm left upper lobe nodule is hypermetabolic (SUVmax =
9.9), and makes contact with the overlying pleura. There is
extensive hypermetabolic left hilar lymphadenopathy (SUVmax = 16
point to), which is contiguous with multiple adjacent nonenlarged
and mildly enlarged but very hypermetabolic mediastinal lymph nodes
throughout the prevascular, low left paratracheal and subcarinal
nodal stations. Subcarinal lymph node measures 13 mm in short axis
(SUVmax = 10.1). There is also nonenlarged but hypermetabolic right
hilar lymphadenopathy (SUVmax = 8.3). Nonenlarged but hypermetabolic
superior mediastinal and left supraclavicular lymph nodes are also
noted (SUVmax = 6.0). 8 mm nodule in the anterior aspect of the
right upper lobe (image 92 of series 4) is also a hypermetabolic
(SUVmax = 4.4), presumably metastatic. Heart size is mildly enlarged
with right atrial dilatation. There is no significant pericardial
fluid, thickening or pericardial calcification. There is
atherosclerosis of the thoracic aorta, the great vessels of the
mediastinum and the coronary arteries, including calcified
atherosclerotic plaque in the left main, left anterior descending,
left circumflex and right coronary arteries. Status post median
sternotomy for CABG, including [REDACTED] to the LAD. Calcifications of
the aortic valve.

ABDOMEN/PELVIS

Small focus of hypermetabolism (SUVmax = 6.6)in segment 7 of the
liver without corresponding CT abnormality. 1.4 cm left adrenal
nodule is hypermetabolic (SUVmax = 4.4). No abnormal hypermetabolic
activity within the pancreas, right adrenal gland, or spleen. No
hypermetabolic lymph nodes in the abdomen or pelvis.

SKELETON

Multiple areas of hypermetabolism in the skeletal musculature,
compatible with soft tissue metastasis, including a lesion in the
right subscapularis muscle (SUVmax = 7.6), and a lesion in the left
anterior abdominal wall musculature (SUVmax = 7.2). In addition,
there are innumerable areas of hypermetabolism throughout the
visualized axial and appendicular skeleton. The majority of these
lesions are occult by CT imaging. Some of these lesions include an
expansile lesion with minimally displaced pathologic fracture
(SUVmax = 8.7) in the lateral aspect of the right seventh rib (image
111 of series 4), a large diffusely infiltrative hypermetabolic
(SUVmax = 15.2)lesion in L1, and a large lesion in the left ilium
(SUVmax = 17.9). Multiple other lesions are noted, including
bilateral femoral neck lesions.
IMPRESSION: 1. 2.8 cm hypermetabolic left upper lobe pulmonary nodule with
bilateral hilar and mediastinal lymphadenopathy, metastatic lesions
in right upper lobe of the lung, the segment 7 of the liver and in
the left adrenal gland, multiple skeletal muscle metastasis, as well
as metastatic lesions throughout all aspects of the visualized axial
and appendicular skeleton, as discussed above, most likely to
represent stage IV lung cancer.
2. Additional incidental findings, as above.

## 2016-03-30 IMAGING — CR DG CHEST 1V
1 series · 1 of 1 positions shown · non-contrast
Comparison: November 26, 2015.

CLINICAL DATA: Status post left-sided thoracentesis.

EXAM:
CHEST 1 VIEW

[x chest ap]
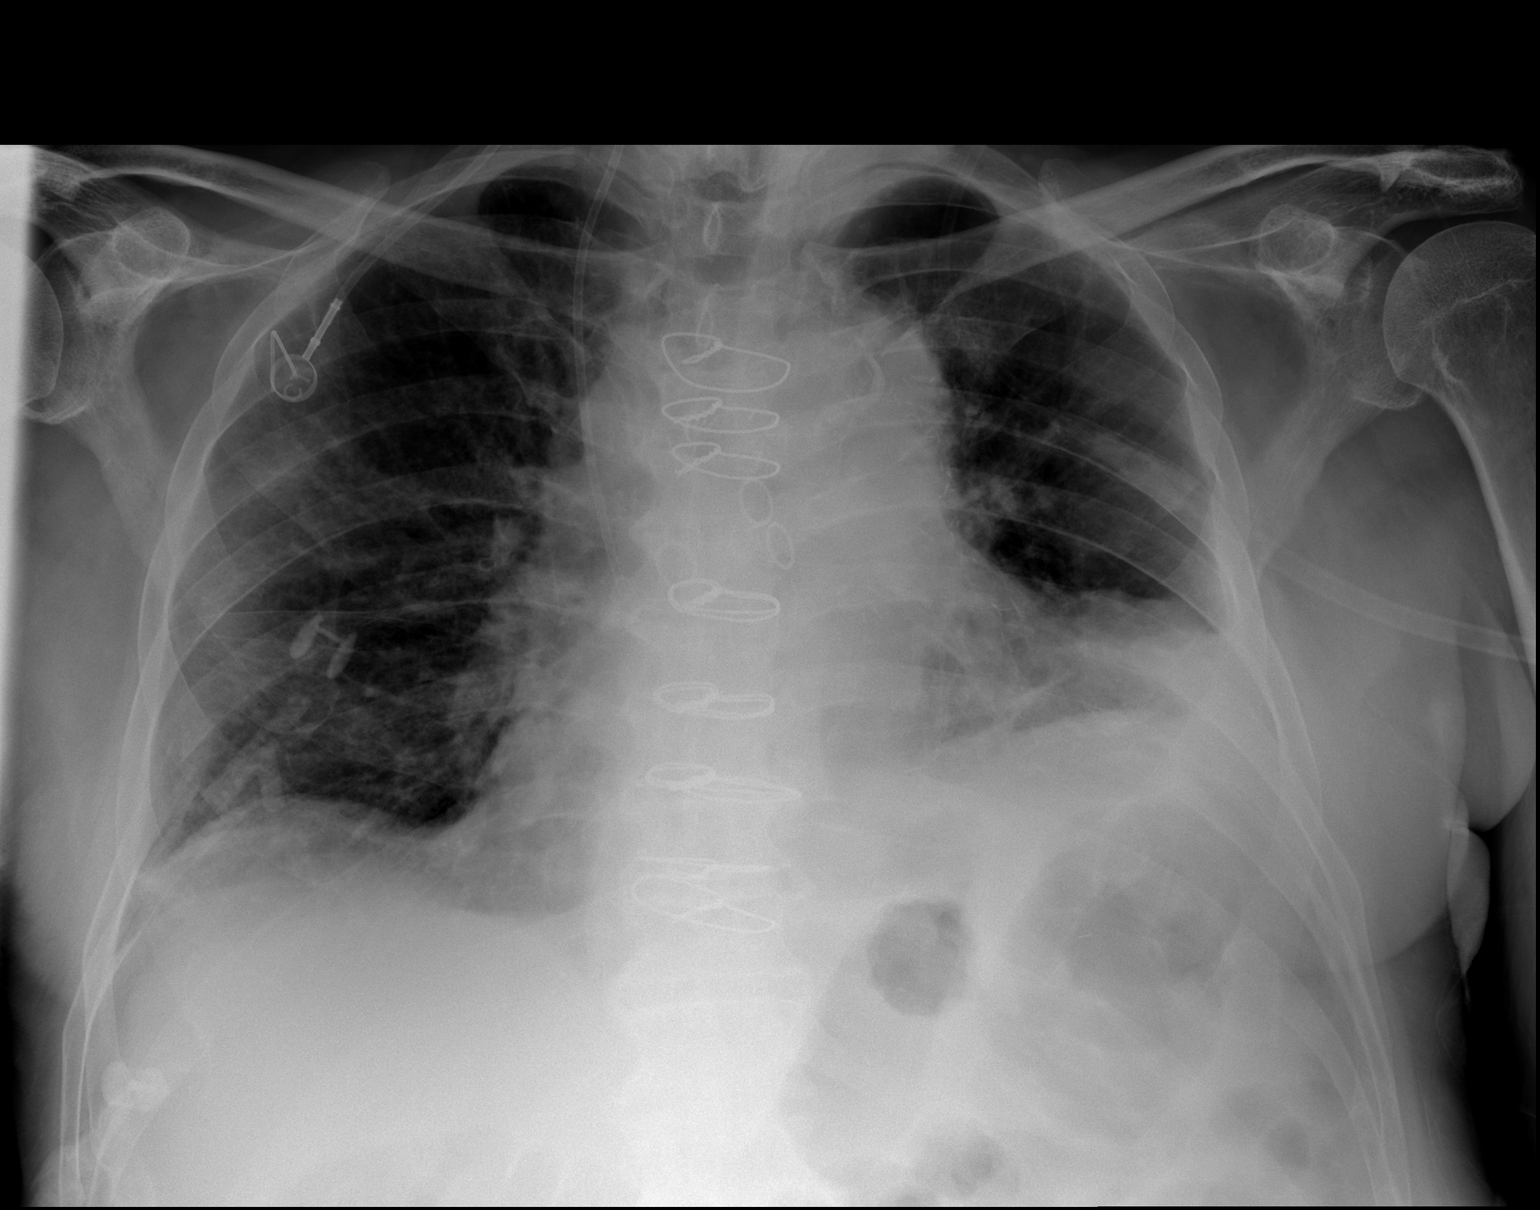

[1 of 1 positions shown; findings below may reference images not displayed]

FINDINGS: Stable cardiomegaly. Status post coronary artery bypass graft. No
change in position of right internal jugular Port-A-Cath. Left
pleural effusion noted on prior exam is significantly smaller status
post thoracentesis. No pneumothorax is noted. Minimal right pleural
effusion is noted. Mild left basilar atelectasis is noted as well.
IMPRESSION: No pneumothorax status post left-sided thoracentesis. Left pleural
effusion is significantly smaller, with mild residual left basilar
atelectasis.

## 2016-04-01 IMAGING — CR DG CHEST 1V PORT
1 series · 1 of 1 positions shown · non-contrast
Comparison: Portable chest x-ray November 26, 2015

CLINICAL DATA: Hypotension; known left-sided pleural effusion,
chronic renal failure, stage IV lung malignancy.

EXAM:
PORTABLE CHEST 1 VIEW

[ap]
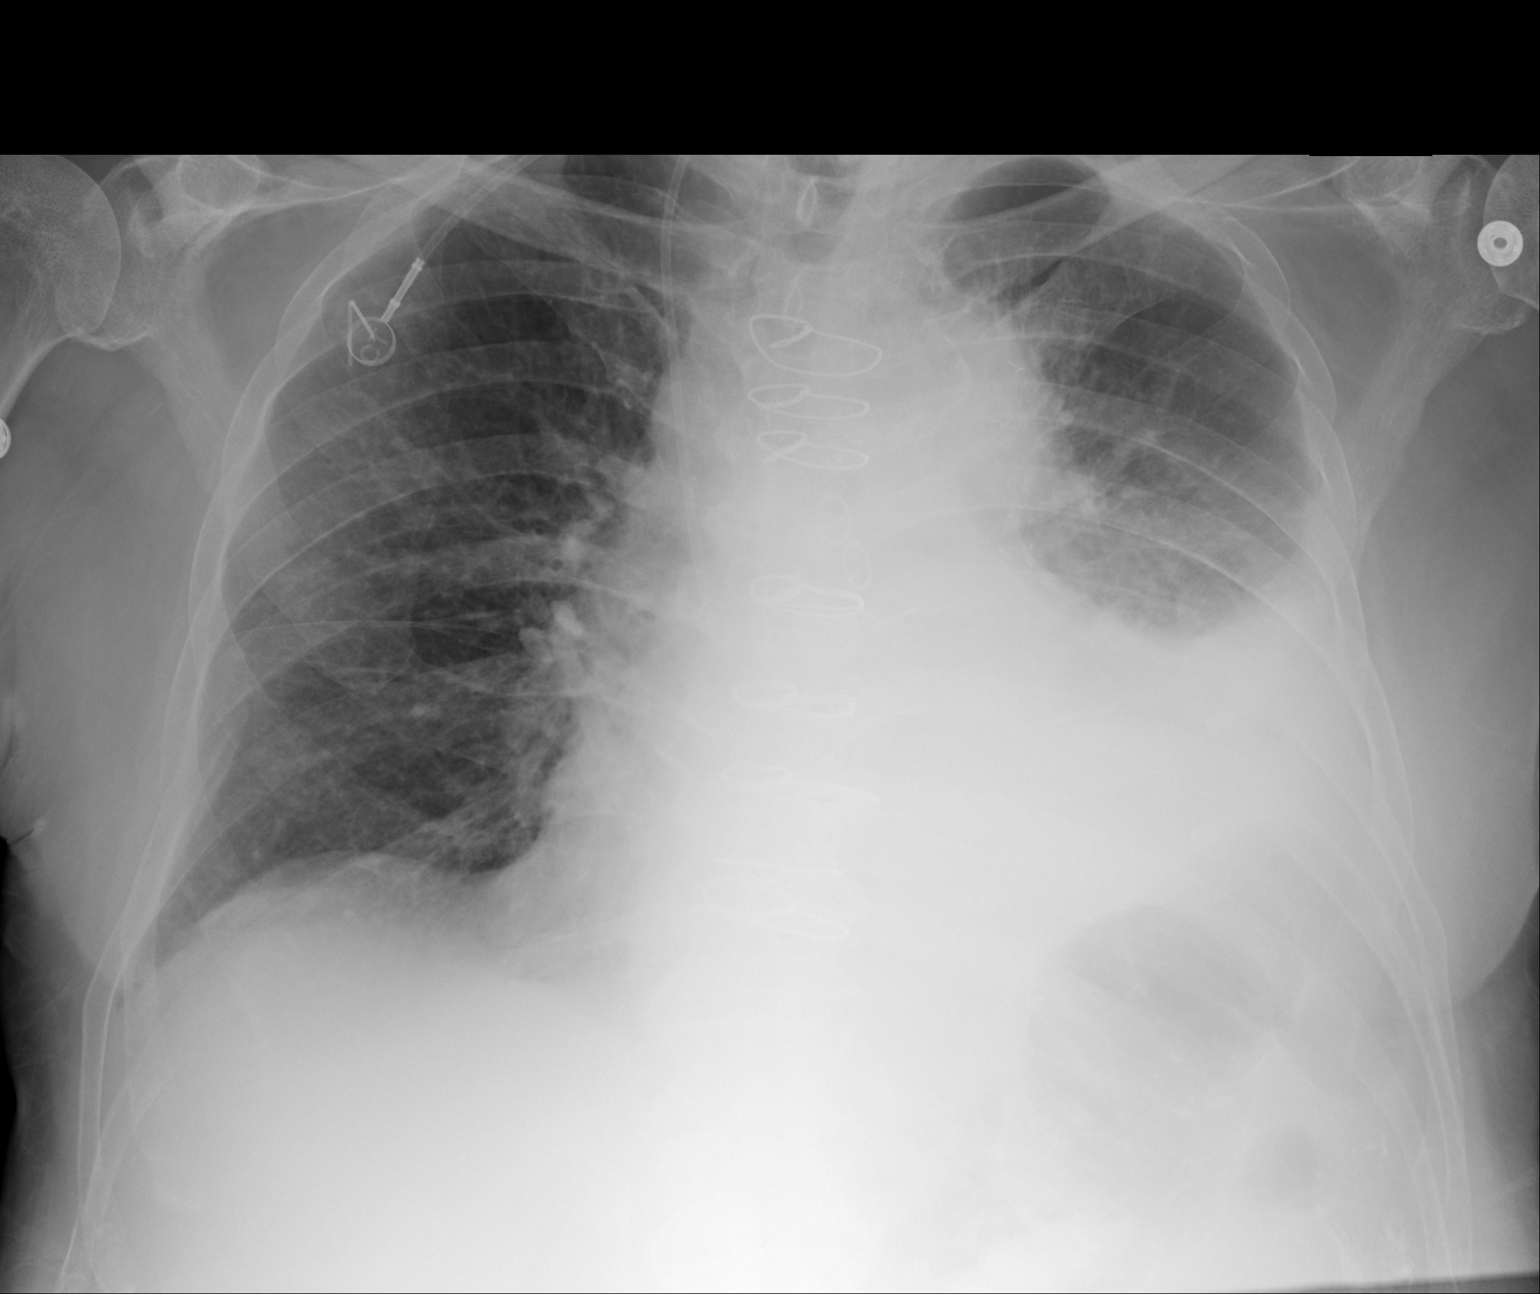

[1 of 1 positions shown; findings below may reference images not displayed]

FINDINGS: The right lung is reasonably well inflated. The interstitial
markings are coarse though stable. On the left volume loss has
increased with a pleural effusion occupying approximately [DATE] of the
pleural space volume. The interstitial markings in the aerated upper
lobe are increased. The cardiac silhouette remains enlarged. The
central pulmonary vascularity is mildly engorged and is indistinct.
The sternal wires are intact. The Port-A-Cath appliance tip projects
over the midportion of the SVC.
IMPRESSION: Interval increase in the volume of pleural fluid on the left. Stable
cardiomegaly. Mild pulmonary vascular congestion predominantly on
the left more conspicuous than on the previous study.

## 2016-04-02 IMAGING — CR DG CHEST 1V PORT
1 series · 1 of 1 positions shown · non-contrast
Comparison: 11/27/2005

CLINICAL DATA: Status post left thoracentesis.

EXAM:
PORTABLE CHEST 1 VIEW

[x chest ap]
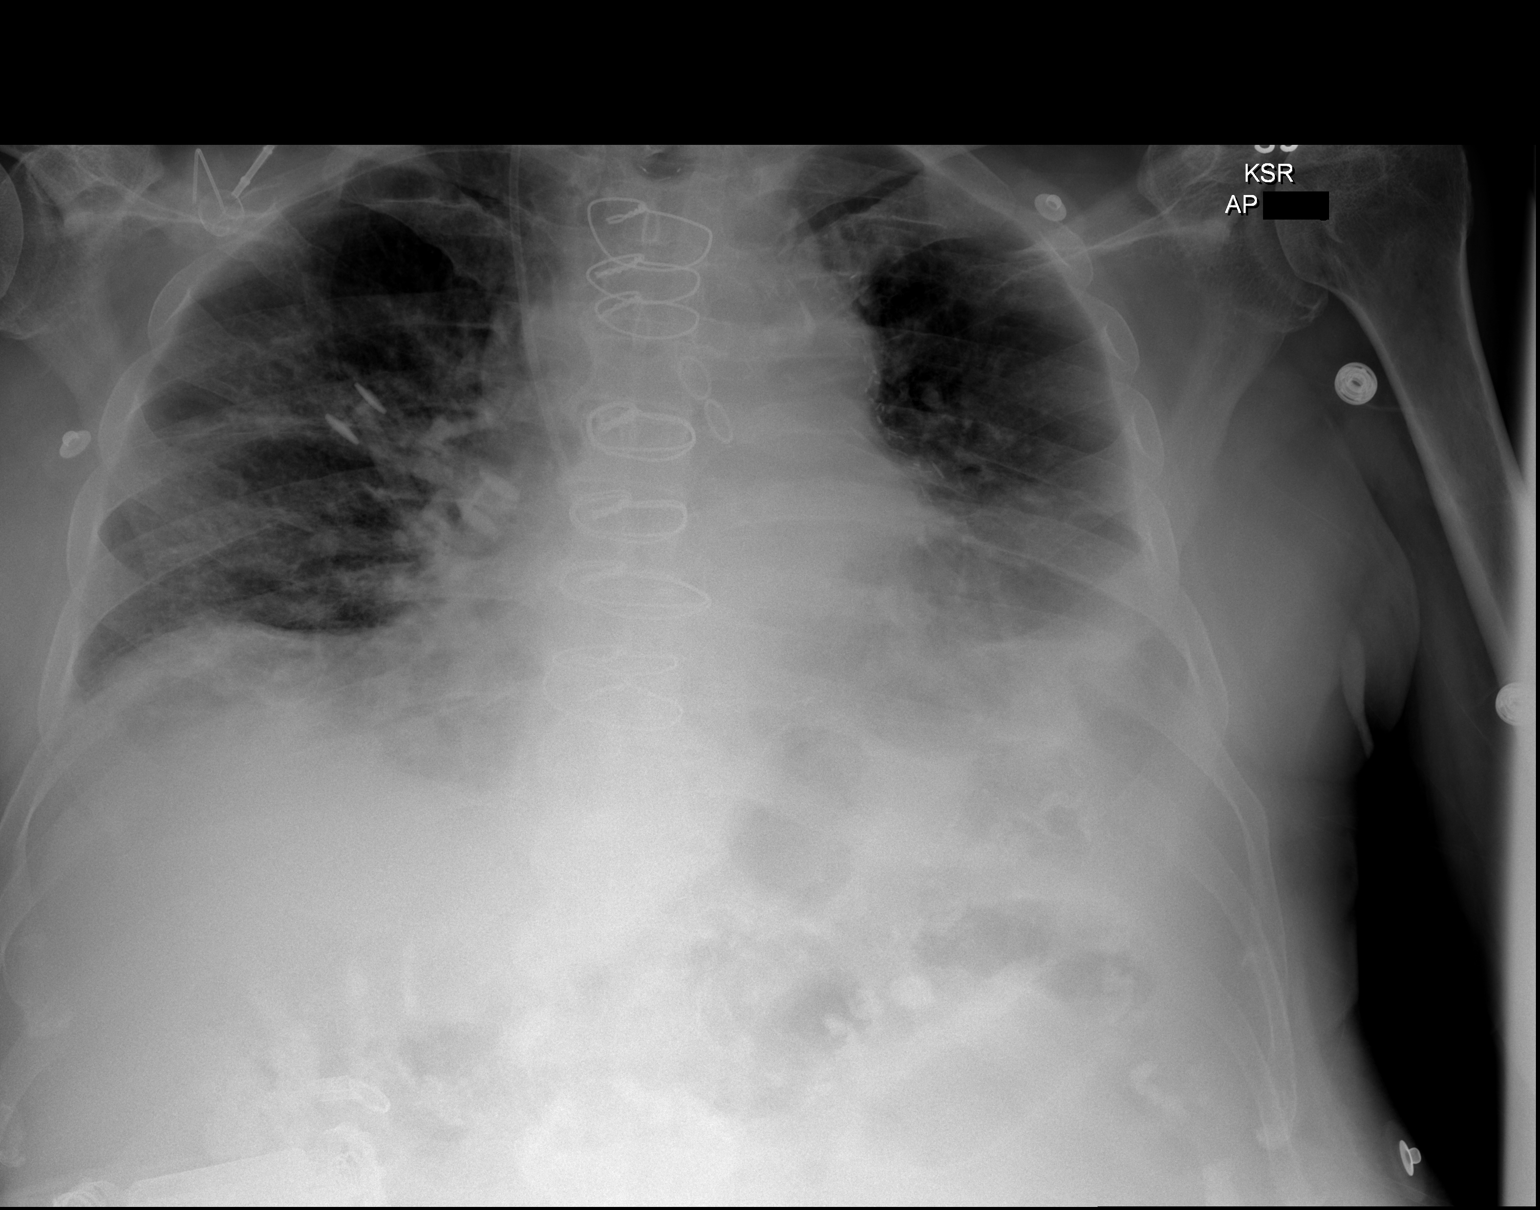

[1 of 1 positions shown; findings below may reference images not displayed]

FINDINGS: No significant residual left pleural fluid after thoracentesis. No
pneumothorax. Lung volumes low bilaterally with bibasilar
atelectasis present. There may be a mild component of pulmonary
venous hypertension/interstitial edema.
IMPRESSION: No significant residual left pleural fluid after thoracentesis. No
pneumothorax following the procedure. There may be a mild component
of pulmonary venous hypertension/ interstitial edema present.

## 2016-04-07 ENCOUNTER — Other Ambulatory Visit: Payer: Self-pay | Admitting: Nurse Practitioner
